# Patient Record
Sex: Male | Born: 1965 | Race: White | Hispanic: No | Marital: Married | Smoking: Current every day smoker
Health system: Southern US, Community
[De-identification: ages and names within clinical notes are randomized; demographics above are authoritative.]

## PROBLEM LIST (undated history)

## (undated) ENCOUNTER — Emergency Department (HOSPITAL_COMMUNITY): Discharge status: Against Medical Advice | Payer: Medicaid Other

## (undated) ENCOUNTER — Emergency Department (HOSPITAL_COMMUNITY): Payer: Medicaid Other | Source: Home / Self Care

## (undated) DIAGNOSIS — Z9359 Other cystostomy status: Secondary | ICD-10-CM

## (undated) DIAGNOSIS — F32A Depression, unspecified: Secondary | ICD-10-CM

## (undated) DIAGNOSIS — G8220 Paraplegia, unspecified: Secondary | ICD-10-CM

## (undated) DIAGNOSIS — F419 Anxiety disorder, unspecified: Secondary | ICD-10-CM

## (undated) DIAGNOSIS — Z960 Presence of urogenital implants: Secondary | ICD-10-CM

## (undated) DIAGNOSIS — G8929 Other chronic pain: Secondary | ICD-10-CM

## (undated) DIAGNOSIS — Z978 Presence of other specified devices: Secondary | ICD-10-CM

## (undated) DIAGNOSIS — G709 Myoneural disorder, unspecified: Secondary | ICD-10-CM

## (undated) DIAGNOSIS — K219 Gastro-esophageal reflux disease without esophagitis: Secondary | ICD-10-CM

## (undated) DIAGNOSIS — N200 Calculus of kidney: Secondary | ICD-10-CM

## (undated) DIAGNOSIS — M545 Low back pain: Secondary | ICD-10-CM

## (undated) DIAGNOSIS — S3992XA Unspecified injury of lower back, initial encounter: Secondary | ICD-10-CM

## (undated) DIAGNOSIS — Z87442 Personal history of urinary calculi: Secondary | ICD-10-CM

## (undated) DIAGNOSIS — Z9289 Personal history of other medical treatment: Secondary | ICD-10-CM

## (undated) DIAGNOSIS — F329 Major depressive disorder, single episode, unspecified: Secondary | ICD-10-CM

## (undated) HISTORY — PX: CYSTOSCOPY W/ STONE MANIPULATION: SHX1427

## (undated) HISTORY — PX: LITHOTRIPSY: SUR834

## (undated) HISTORY — PX: BACK SURGERY: SHX140

## (undated) HISTORY — DX: Calculus of kidney: N20.0

---

## 2002-09-28 DIAGNOSIS — S3992XA Unspecified injury of lower back, initial encounter: Secondary | ICD-10-CM

## 2002-09-28 HISTORY — DX: Unspecified injury of lower back, initial encounter: S39.92XA

## 2003-11-02 ENCOUNTER — Emergency Department (HOSPITAL_COMMUNITY): Admission: EM | Admit: 2003-11-02 | Discharge: 2003-11-03 | Payer: Self-pay | Admitting: Internal Medicine

## 2003-11-02 ENCOUNTER — Emergency Department (HOSPITAL_COMMUNITY): Admission: EM | Admit: 2003-11-02 | Discharge: 2003-11-02 | Payer: Self-pay | Admitting: Emergency Medicine

## 2003-11-06 ENCOUNTER — Ambulatory Visit (HOSPITAL_COMMUNITY): Admission: RE | Admit: 2003-11-06 | Discharge: 2003-11-06 | Payer: Self-pay | Admitting: Internal Medicine

## 2003-11-22 ENCOUNTER — Ambulatory Visit (HOSPITAL_COMMUNITY): Admission: RE | Admit: 2003-11-22 | Discharge: 2003-11-22 | Payer: Self-pay | Admitting: Internal Medicine

## 2003-11-26 ENCOUNTER — Ambulatory Visit (HOSPITAL_COMMUNITY): Admission: RE | Admit: 2003-11-26 | Discharge: 2003-11-26 | Payer: Self-pay | Admitting: Internal Medicine

## 2004-01-21 ENCOUNTER — Ambulatory Visit (HOSPITAL_COMMUNITY): Admission: RE | Admit: 2004-01-21 | Discharge: 2004-01-21 | Payer: Self-pay | Admitting: Neurosurgery

## 2004-05-26 ENCOUNTER — Ambulatory Visit (HOSPITAL_COMMUNITY): Admission: RE | Admit: 2004-05-26 | Discharge: 2004-05-26 | Payer: Self-pay | Admitting: Internal Medicine

## 2004-08-12 ENCOUNTER — Ambulatory Visit (HOSPITAL_COMMUNITY): Admission: RE | Admit: 2004-08-12 | Discharge: 2004-08-12 | Payer: Self-pay | Admitting: Neurosurgery

## 2009-02-22 ENCOUNTER — Encounter: Admission: RE | Admit: 2009-02-22 | Discharge: 2009-02-22 | Payer: Self-pay | Admitting: Neurosurgery

## 2009-08-19 ENCOUNTER — Encounter: Admission: RE | Admit: 2009-08-19 | Discharge: 2009-08-19 | Payer: Self-pay | Admitting: Neurosurgery

## 2010-01-26 HISTORY — PX: LAMINECTOMY AND MICRODISCECTOMY THORACIC SPINE: SHX1915

## 2010-02-07 ENCOUNTER — Inpatient Hospital Stay (HOSPITAL_COMMUNITY): Admission: RE | Admit: 2010-02-07 | Discharge: 2010-02-10 | Payer: Self-pay | Admitting: Neurosurgery

## 2010-02-07 ENCOUNTER — Encounter (INDEPENDENT_AMBULATORY_CARE_PROVIDER_SITE_OTHER): Payer: Self-pay | Admitting: Neurosurgery

## 2010-06-09 ENCOUNTER — Ambulatory Visit (HOSPITAL_COMMUNITY): Admission: RE | Admit: 2010-06-09 | Discharge: 2010-06-09 | Payer: Self-pay | Admitting: Urology

## 2010-09-11 ENCOUNTER — Encounter
Admission: RE | Admit: 2010-09-11 | Discharge: 2010-09-16 | Payer: Self-pay | Source: Home / Self Care | Attending: Physical Medicine & Rehabilitation | Admitting: Physical Medicine & Rehabilitation

## 2010-09-16 ENCOUNTER — Ambulatory Visit: Payer: Self-pay | Admitting: Physical Medicine & Rehabilitation

## 2010-10-02 ENCOUNTER — Encounter (HOSPITAL_COMMUNITY)
Admission: RE | Admit: 2010-10-02 | Discharge: 2010-10-28 | Payer: Self-pay | Source: Home / Self Care | Attending: Physical Medicine & Rehabilitation | Admitting: Physical Medicine & Rehabilitation

## 2010-10-10 ENCOUNTER — Encounter
Admission: RE | Admit: 2010-10-10 | Discharge: 2010-10-28 | Payer: Self-pay | Source: Home / Self Care | Attending: Physical Medicine & Rehabilitation | Admitting: Physical Medicine & Rehabilitation

## 2010-10-14 ENCOUNTER — Ambulatory Visit
Admission: RE | Admit: 2010-10-14 | Discharge: 2010-10-14 | Payer: Self-pay | Source: Home / Self Care | Attending: Physical Medicine & Rehabilitation | Admitting: Physical Medicine & Rehabilitation

## 2010-10-18 ENCOUNTER — Encounter: Payer: Self-pay | Admitting: Internal Medicine

## 2010-10-18 ENCOUNTER — Encounter: Payer: Self-pay | Admitting: Neurology

## 2010-10-19 ENCOUNTER — Encounter: Payer: Self-pay | Admitting: Neurosurgery

## 2010-10-20 ENCOUNTER — Encounter: Payer: Self-pay | Admitting: Neurosurgery

## 2010-11-11 ENCOUNTER — Ambulatory Visit (HOSPITAL_BASED_OUTPATIENT_CLINIC_OR_DEPARTMENT_OTHER): Payer: Medicaid Other | Admitting: Physical Medicine & Rehabilitation

## 2010-11-11 ENCOUNTER — Encounter: Payer: Medicaid Other | Attending: Physical Medicine & Rehabilitation

## 2010-11-11 DIAGNOSIS — M4714 Other spondylosis with myelopathy, thoracic region: Secondary | ICD-10-CM

## 2010-11-11 DIAGNOSIS — G950 Syringomyelia and syringobulbia: Secondary | ICD-10-CM | POA: Insufficient documentation

## 2010-11-11 DIAGNOSIS — G8220 Paraplegia, unspecified: Secondary | ICD-10-CM

## 2010-11-11 DIAGNOSIS — IMO0002 Reserved for concepts with insufficient information to code with codable children: Secondary | ICD-10-CM | POA: Insufficient documentation

## 2010-11-11 DIAGNOSIS — N200 Calculus of kidney: Secondary | ICD-10-CM | POA: Insufficient documentation

## 2010-11-11 DIAGNOSIS — Z79899 Other long term (current) drug therapy: Secondary | ICD-10-CM | POA: Insufficient documentation

## 2010-12-10 ENCOUNTER — Ambulatory Visit (HOSPITAL_BASED_OUTPATIENT_CLINIC_OR_DEPARTMENT_OTHER): Payer: Medicaid Other

## 2010-12-10 ENCOUNTER — Encounter: Payer: Medicaid Other | Attending: Physical Medicine & Rehabilitation

## 2010-12-10 DIAGNOSIS — G839 Paralytic syndrome, unspecified: Secondary | ICD-10-CM | POA: Insufficient documentation

## 2010-12-10 DIAGNOSIS — G950 Syringomyelia and syringobulbia: Secondary | ICD-10-CM | POA: Insufficient documentation

## 2010-12-10 DIAGNOSIS — M79609 Pain in unspecified limb: Secondary | ICD-10-CM | POA: Insufficient documentation

## 2010-12-10 DIAGNOSIS — M4714 Other spondylosis with myelopathy, thoracic region: Secondary | ICD-10-CM

## 2010-12-10 DIAGNOSIS — M549 Dorsalgia, unspecified: Secondary | ICD-10-CM | POA: Insufficient documentation

## 2010-12-11 ENCOUNTER — Other Ambulatory Visit (HOSPITAL_COMMUNITY): Payer: Self-pay | Admitting: Urology

## 2010-12-11 DIAGNOSIS — N200 Calculus of kidney: Secondary | ICD-10-CM

## 2010-12-16 LAB — CBC
HCT: 46.7 % (ref 39.0–52.0)
Hemoglobin: 16.5 g/dL (ref 13.0–17.0)
MCHC: 35.3 g/dL (ref 30.0–36.0)
MCV: 94.6 fL (ref 78.0–100.0)
Platelets: 142 10*3/uL — ABNORMAL LOW (ref 150–400)
RBC: 4.94 MIL/uL (ref 4.22–5.81)
RDW: 13 % (ref 11.5–15.5)
WBC: 8.4 10*3/uL (ref 4.0–10.5)

## 2010-12-16 LAB — SURGICAL PCR SCREEN
MRSA, PCR: NEGATIVE
Staphylococcus aureus: NEGATIVE

## 2010-12-17 ENCOUNTER — Ambulatory Visit (HOSPITAL_COMMUNITY): Payer: Medicaid Other
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
## 2011-01-16 ENCOUNTER — Encounter: Payer: Medicaid Other | Attending: Physical Medicine & Rehabilitation

## 2011-01-16 ENCOUNTER — Ambulatory Visit (HOSPITAL_BASED_OUTPATIENT_CLINIC_OR_DEPARTMENT_OTHER): Payer: Medicaid Other | Admitting: Physical Medicine & Rehabilitation

## 2011-01-16 DIAGNOSIS — G950 Syringomyelia and syringobulbia: Secondary | ICD-10-CM | POA: Insufficient documentation

## 2011-01-16 DIAGNOSIS — M4714 Other spondylosis with myelopathy, thoracic region: Secondary | ICD-10-CM

## 2011-01-16 DIAGNOSIS — G8220 Paraplegia, unspecified: Secondary | ICD-10-CM | POA: Insufficient documentation

## 2011-01-16 DIAGNOSIS — Z79899 Other long term (current) drug therapy: Secondary | ICD-10-CM | POA: Insufficient documentation

## 2011-01-16 DIAGNOSIS — IMO0002 Reserved for concepts with insufficient information to code with codable children: Secondary | ICD-10-CM | POA: Insufficient documentation

## 2011-01-16 DIAGNOSIS — N200 Calculus of kidney: Secondary | ICD-10-CM | POA: Insufficient documentation
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
## 2011-02-11 ENCOUNTER — Encounter: Payer: Medicaid Other | Attending: Physical Medicine & Rehabilitation | Admitting: Neurosurgery

## 2011-02-11 DIAGNOSIS — N200 Calculus of kidney: Secondary | ICD-10-CM | POA: Insufficient documentation

## 2011-02-11 DIAGNOSIS — Z79899 Other long term (current) drug therapy: Secondary | ICD-10-CM | POA: Insufficient documentation

## 2011-02-11 DIAGNOSIS — G894 Chronic pain syndrome: Secondary | ICD-10-CM

## 2011-02-11 DIAGNOSIS — G8220 Paraplegia, unspecified: Secondary | ICD-10-CM | POA: Insufficient documentation

## 2011-02-11 DIAGNOSIS — IMO0002 Reserved for concepts with insufficient information to code with codable children: Secondary | ICD-10-CM | POA: Insufficient documentation

## 2011-02-11 DIAGNOSIS — G950 Syringomyelia and syringobulbia: Secondary | ICD-10-CM | POA: Insufficient documentation
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
## 2011-03-13 ENCOUNTER — Encounter: Payer: Medicaid Other | Attending: Physical Medicine & Rehabilitation

## 2011-03-13 ENCOUNTER — Ambulatory Visit (HOSPITAL_BASED_OUTPATIENT_CLINIC_OR_DEPARTMENT_OTHER): Payer: Medicaid Other | Admitting: Physical Medicine & Rehabilitation

## 2011-03-13 DIAGNOSIS — Z79899 Other long term (current) drug therapy: Secondary | ICD-10-CM | POA: Insufficient documentation

## 2011-03-13 DIAGNOSIS — G8220 Paraplegia, unspecified: Secondary | ICD-10-CM | POA: Insufficient documentation

## 2011-03-13 DIAGNOSIS — IMO0002 Reserved for concepts with insufficient information to code with codable children: Secondary | ICD-10-CM | POA: Insufficient documentation

## 2011-03-13 DIAGNOSIS — N200 Calculus of kidney: Secondary | ICD-10-CM | POA: Insufficient documentation

## 2011-03-13 DIAGNOSIS — G950 Syringomyelia and syringobulbia: Secondary | ICD-10-CM | POA: Insufficient documentation

## 2011-04-02 NOTE — Assessment & Plan Note (Addendum)
REASON FOR VISIT:  Spasms in both calves.  A 45 year old male with chronic low back pain as well as spastic paraplegia due to posttraumatic thoracic syringomyelia.  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  From a pain management standpoint, he has done relatively well on a fairly complex combination of medications.  We are prescribing oxycodone 10 mg q.i.d. with Opana 40 mg b.i.d.  His primary care physician is prescribing Neurontin 800 mg t.i.d., naproxen 500 b.i.d. as well as Cymbalta 60 mg a day.  In addition, he is on Valium 10 mg t.i.d. and 20 at bedtime.  From a spasticity standpoint, we have him on baclofen 20 q.i.d. up from 10 q.i.d..  INTERVAL HISTORY:  No falls.  No new medical issues.  Pain level is 8-9 if he does not take his medicine, but with medicine he has a 50% relief or has a fair relief.  He walks 15 minutes at a time with a walker.  He drives short distances.  He needs help with certain dressing and bathing activities but otherwise is independent.  He needs help with meal prep, household duties, and shopping.  REVIEW OF SYSTEMS:  Positive for bladder control problems, weakness, numbness, trouble walking, spasms, and anxiety.  SOCIAL HISTORY:  Married, lives with his wife.  FAMILY HISTORY:  Diabetes.  PHYSICAL EXAMINATION:  VITAL SIGNS:  Blood pressure 120/69, pulse 88, respirations 18, and O2 saturation 97% on room air. GENERAL:  This is an overweight male in no acute distress.  His mood and affect are appropriate. NEUROLOGIC:  His motor strength is 5/5 in the upper extremities.  In the lower extremity, 3- in the ankle dorsiflexors, 4- in the extensors, 3 in the hip flexors.  He has clonus at bilateral ankles.  He has also 2 beats of clonus at the knee extensors.  He has Ashworth grade 3 spasticity of the knee extensors and  ankle plantar flexors.  IMPRESSION: 1. 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 2. Chronic pain associated with myelopathy.  PLAN:  For his spasticity, he will continue his baclofen 20 q.i.d. as well as the Valium 10 mg t.i.d. and 20 at bedtime, but in addition, he is having some painful spasms in his calves.  For this reason, we will inject Botox 100 units per gastrocnemius, i.e., 50 into the lateral and medial heads bilaterally.  We may need to increase that dose if he has insufficient result.  In addition, we will continue the current medications.  In terms of his pain medications, oxycodone 10 mg q.i.d. in combination with Opana 40 mg b.i.d.  He is already taking Cymbalta 60 mg a day plus gabapentin 800 t.i.d. Discussed with the patient, agrees with plan.     Erick Colace, M.D. Electronically Signed    AEK/MedQ D:  03/13/2011 14:18:47  T:  03/14/2011 01:24:45  Job #:  [id number]  cc:   Carroll Sage, MD

## 2011-04-10 ENCOUNTER — Ambulatory Visit: Payer: Medicaid Other | Admitting: Neurosurgery

## 2011-05-19 ENCOUNTER — Ambulatory Visit (HOSPITAL_COMMUNITY): Payer: Medicaid Other | Admitting: Physical Therapy

## 2011-06-08 ENCOUNTER — Ambulatory Visit (HOSPITAL_COMMUNITY)
Admission: RE | Admit: 2011-06-08 | Discharge: 2011-06-08 | Disposition: A | Discharge status: Home / Self Care | Payer: Medicaid Other | Source: Ambulatory Visit | Attending: Family Medicine | Admitting: Family Medicine

## 2011-06-08 DIAGNOSIS — M25676 Stiffness of unspecified foot, not elsewhere classified: Secondary | ICD-10-CM | POA: Insufficient documentation

## 2011-06-08 DIAGNOSIS — M25669 Stiffness of unspecified knee, not elsewhere classified: Secondary | ICD-10-CM | POA: Insufficient documentation

## 2011-06-08 DIAGNOSIS — M25673 Stiffness of unspecified ankle, not elsewhere classified: Secondary | ICD-10-CM | POA: Insufficient documentation

## 2011-06-08 DIAGNOSIS — M25579 Pain in unspecified ankle and joints of unspecified foot: Secondary | ICD-10-CM | POA: Insufficient documentation

## 2011-06-08 DIAGNOSIS — IMO0001 Reserved for inherently not codable concepts without codable children: Secondary | ICD-10-CM | POA: Insufficient documentation

## 2011-06-08 DIAGNOSIS — M6281 Muscle weakness (generalized): Secondary | ICD-10-CM | POA: Insufficient documentation

## 2011-06-08 DIAGNOSIS — M25569 Pain in unspecified knee: Secondary | ICD-10-CM | POA: Insufficient documentation

## 2011-06-08 NOTE — Progress Notes (Signed)
Pt is a medicaid pt.  Note will be scanned into system.   Pt is to work on prone exercises next visit.   Pt seen for eval and theraband exercises for improved posture.

## 2011-06-08 NOTE — Patient Instructions (Addendum)
HEP for T- band postural mm.

## 2011-06-15 ENCOUNTER — Telehealth (HOSPITAL_COMMUNITY): Payer: Self-pay | Admitting: *Deleted

## 2011-06-15 ENCOUNTER — Inpatient Hospital Stay (HOSPITAL_COMMUNITY): Admission: RE | Admit: 2011-06-15 | Payer: Medicaid Other | Source: Ambulatory Visit | Admitting: *Deleted

## 2011-10-15 ENCOUNTER — Ambulatory Visit (HOSPITAL_COMMUNITY)
Admission: RE | Admit: 2011-10-15 | Discharge: 2011-10-15 | Disposition: A | Discharge status: Home / Self Care | Payer: Medicaid Other | Source: Ambulatory Visit | Attending: Urology | Admitting: Urology

## 2011-10-15 ENCOUNTER — Other Ambulatory Visit (HOSPITAL_COMMUNITY): Payer: Self-pay | Admitting: Urology

## 2011-10-15 DIAGNOSIS — R109 Unspecified abdominal pain: Secondary | ICD-10-CM | POA: Insufficient documentation

## 2011-10-15 DIAGNOSIS — N200 Calculus of kidney: Secondary | ICD-10-CM

## 2011-10-15 DIAGNOSIS — Z09 Encounter for follow-up examination after completed treatment for conditions other than malignant neoplasm: Secondary | ICD-10-CM | POA: Insufficient documentation

## 2011-10-26 ENCOUNTER — Other Ambulatory Visit (HOSPITAL_COMMUNITY): Payer: Self-pay | Admitting: Urology

## 2011-10-26 DIAGNOSIS — N200 Calculus of kidney: Secondary | ICD-10-CM

## 2011-10-28 ENCOUNTER — Ambulatory Visit (HOSPITAL_COMMUNITY)
Admission: RE | Admit: 2011-10-28 | Discharge: 2011-10-28 | Disposition: A | Discharge status: Home / Self Care | Payer: Medicaid Other | Source: Ambulatory Visit | Attending: Urology | Admitting: Urology

## 2011-10-28 DIAGNOSIS — N200 Calculus of kidney: Secondary | ICD-10-CM | POA: Insufficient documentation

## 2011-10-28 DIAGNOSIS — R1032 Left lower quadrant pain: Secondary | ICD-10-CM | POA: Insufficient documentation

## 2011-10-28 DIAGNOSIS — R1031 Right lower quadrant pain: Secondary | ICD-10-CM | POA: Insufficient documentation

## 2011-10-30 ENCOUNTER — Encounter (HOSPITAL_COMMUNITY): Payer: Self-pay | Admitting: Pharmacy Technician

## 2011-11-02 ENCOUNTER — Encounter (HOSPITAL_COMMUNITY)
Admission: RE | Admit: 2011-11-02 | Discharge: 2011-11-02 | Payer: Medicaid Other | Source: Ambulatory Visit | Attending: Urology | Admitting: Urology

## 2011-11-02 NOTE — Patient Instructions (Addendum)
[address]  11/02/2011   Your procedure is scheduled on:  11/04/11  Report to [facility] at 11:30 AM.  Call this number if you have problems the morning of surgery: [phone]   Remember:   Do not eat food:After Midnight.  May have clear liquids:until Midnight .  Clear liquids include soda, tea, black coffee, apple or grape juice, broth.  Take these medicines the morning of surgery with A SIP OF WATER: Baclofen, Flexeril, and Neurotin. Take your Valium and Oxycodone only if needed.   Do not wear jewelry, make-up or nail polish.  Do not wear lotions, powders, or perfumes. You may wear deodorant.  Do not shave 48 hours prior to surgery.  Do not bring valuables to the hospital.  Contacts, dentures or bridgework may not be worn into surgery.  Leave suitcase in the car. After surgery it may be brought to your room.  For patients admitted to the hospital, checkout time is 11:00 AM the day of discharge.   Patients discharged the day of surgery will not be allowed to drive home.  Name and phone number of your driver:   Special Instructions: N/A   Please read over the following fact sheets that you were given: Pain Booklet, Anesthesia Post-op Instructions and Care and Recovery After Surgery    Lithotripsy for Kidney Stones WHAT ARE KIDNEY STONES? The kidneys filter blood for chemicals the body cannot use. These waste chemicals are eliminated in the urine. They are removed from the body. Under some conditions, these chemicals may become concentrated. When this happens, they form crystals in the urine. When these crystals build up and stick together, stones may form. When these stones block the flow of urine through the urinary tract, they may cause severe pain. The urinary tract is very sensitive to blockage and stretching by the stone. WHAT IS LITHOTRIPSY? Lithotripsy is a treatment that can sometimes help eliminate kidney stones and pain faster. A form of lithotripsy, also known as ESWL  (extracorporeal shock wave lithotripsy), is a nonsurgical procedure that helps your body rid itself of the kidney stone with a minimum amount of pain. EWSL is a method of crushing a kidney stone with shock waves. These shock waves pass through your body. They cause the kidney stones to crumble while still in the urinary tract. It is then easier for the smaller pieces of stone to pass in the urine. Lithotripsy usually takes about an hour. It is done in a hospital, a lithotripsy center, or a mobile unit. It usually does not require an overnight stay. Your caregiver will instruct you on preparation for the procedure. Your caregiver will tell you what to expect afterward. LET YOUR CAREGIVER KNOW ABOUT:  Allergies.   Medicines taken including herbs, eye drops, over the counter medicines (including aspirin, aleve, or motrin for treatment of inflammatory conditions) and creams.   Use of steroids (by mouth or creams).   Previous problems with anesthetics or novocaine.   Possibility of pregnancy, if this applies.   History of blood clots (thrombophlebitis).   History of bleeding or blood problems.   Previous surgery.   Other health problems.  RISKS AND COMPLICATIONS Complications of lithotripsy are uncommon, but include the following:  Infection.   Bleeding of the kidney.   Bruising of the kidney or skin.   Obstruction of the ureter (the passageway from the kidney to the bladder).   Failure of the stone to fragment (break apart).  PROCEDURE A stent (flexible tube with  holes) may be placed in your ureter. The ureter is the tube that transports the urine from the kidneys to the bladder. Your caregiver may place a stent before the procedure. This will help keep urine flowing from the kidney if the fragments of the stone block the ureter. You may receive an intravenous (IV) line to give you fluids and medicines. These medicines may help you relax or make you sleep. During the procedure, you will  lie comfortably on a fluid-filled cushion or in a warm-water bath. After an x-ray or ultrasound locates your stone, shock waves are aimed at the stone. If you are awake, you may feel a tapping sensation (feeling) as the shock waves pass through your body. If large stone particles remain after treatment, a second procedure may be necessary at a later date. For comfort during the test:  Relax as much as possible.   Try to remain still as much as possible.   Try to follow instructions to speed up the test.   Let your caregiver know if you are uncomfortable, anxious, or in pain.  AFTER THE PROCEDURE  After surgery, you will be taken to the recovery area. A nurse will watch and check your progress. Once you're awake, stable, and taking fluids well, you will be allowed to go home as long as there are no problems. You may be prescribed antibiotics (medicines that kill germs) to help prevent infection. You may also be prescribed pain medicine if needed. In a week or two, your doctor may remove your stent, if you have one. Your caregiver will check to see whether or not stone particles remain. PASSING THE STONE It may take anywhere from a day to several weeks for the stone particles to leave your body. During this time, drink at least 8 to 12 eight ounce glasses of water every day. It is normal for your urine to be cloudy or slightly bloody for a few weeks following this procedure. You may even see small pieces of stone in your urine. A slight fever and some pain are also normal. Your caregiver may ask you to strain your urine to collect some stone particles for chemical analysis. If you find particles while straining the urine, save them. Analysis tells you and the caregiver what the stone is made of. Knowing this may help prevent future stones. PREVENTING FUTURE STONES  Drink about 8 to 12, eight-ounce glasses of water every day.   Follow the diet your caregiver recommends.   Take your prescribed  medicine.   See your caregiver regularly for checkups.  SEEK IMMEDIATE MEDICAL CARE IF:  You develop an oral temperature above 102 F (38.9 C), or as your caregiver suggests.   Your pain is not relieved by medicine.   You develop nausea (feeling sick to your stomach) and vomiting.   You develop heavy bleeding.   You have difficulty urinating.  Document Released: 09/11/2000 Document Revised: 05/27/2011 Document Reviewed: 07/06/2008 [facility] Patient Information 2012 [place], [place].   PATIENT INSTRUCTIONS POST-ANESTHESIA  IMMEDIATELY FOLLOWING SURGERY:  Do not drive or operate machinery for the first twenty four hours after surgery.  Do not make any important decisions for twenty four hours after surgery or while taking narcotic pain medications or sedatives.  If you develop intractable nausea and vomiting or a severe headache please notify your doctor immediately.  FOLLOW-UP:  Please make an appointment with your surgeon as instructed. You do not need to follow up with anesthesia unless specifically instructed to do so.  WOUND CARE INSTRUCTIONS (if applicable):  Keep a dry clean dressing on the anesthesia/puncture wound site if there is drainage.  Once the wound has quit draining you may leave it open to air.  Generally you should leave the bandage intact for twenty four hours unless there is drainage.  If the epidural site drains for more than 36-48 hours please call the anesthesia department.  QUESTIONS?:  Please feel free to call your physician or the hospital operator if you have any questions, and they will be happy to assist you.     [facility] Anesthesia Department [address] [place] [phone]

## 2011-11-03 ENCOUNTER — Encounter (HOSPITAL_COMMUNITY)
Admission: RE | Admit: 2011-11-03 | Discharge: 2011-11-03 | Disposition: A | Discharge status: Home / Self Care | Payer: Medicaid Other | Source: Ambulatory Visit | Attending: Urology | Admitting: Urology

## 2011-11-03 ENCOUNTER — Encounter (HOSPITAL_COMMUNITY): Payer: Self-pay

## 2011-11-03 HISTORY — DX: Unspecified injury of lower back, initial encounter: S39.92XA

## 2011-11-03 HISTORY — DX: Myoneural disorder, unspecified: G70.9

## 2011-11-03 HISTORY — DX: Depression, unspecified: F32.A

## 2011-11-03 HISTORY — DX: Major depressive disorder, single episode, unspecified: F32.9

## 2011-11-04 ENCOUNTER — Encounter (HOSPITAL_COMMUNITY): Admission: RE | Payer: Self-pay | Source: Ambulatory Visit

## 2011-11-04 ENCOUNTER — Ambulatory Visit (HOSPITAL_COMMUNITY): Admission: RE | Admit: 2011-11-04 | Payer: Medicaid Other | Source: Ambulatory Visit | Admitting: Urology

## 2011-11-04 SURGERY — LITHOTRIPSY, ESWL
Anesthesia: Moderate Sedation | Laterality: Left

## 2012-06-02 ENCOUNTER — Other Ambulatory Visit (HOSPITAL_COMMUNITY): Payer: Self-pay | Admitting: Urology

## 2012-06-02 DIAGNOSIS — R109 Unspecified abdominal pain: Secondary | ICD-10-CM

## 2012-06-06 ENCOUNTER — Ambulatory Visit (HOSPITAL_COMMUNITY)
Admission: RE | Admit: 2012-06-06 | Discharge: 2012-06-06 | Disposition: A | Discharge status: Home / Self Care | Payer: Medicaid Other | Source: Ambulatory Visit | Attending: Urology | Admitting: Urology

## 2012-06-06 ENCOUNTER — Encounter (HOSPITAL_COMMUNITY): Payer: Self-pay

## 2012-06-06 DIAGNOSIS — R9389 Abnormal findings on diagnostic imaging of other specified body structures: Secondary | ICD-10-CM | POA: Insufficient documentation

## 2012-06-06 DIAGNOSIS — N210 Calculus in bladder: Secondary | ICD-10-CM | POA: Insufficient documentation

## 2012-06-06 DIAGNOSIS — R109 Unspecified abdominal pain: Secondary | ICD-10-CM

## 2012-06-06 DIAGNOSIS — R1032 Left lower quadrant pain: Secondary | ICD-10-CM | POA: Insufficient documentation

## 2014-06-16 ENCOUNTER — Encounter (HOSPITAL_COMMUNITY): Payer: Self-pay | Admitting: Emergency Medicine

## 2014-06-16 ENCOUNTER — Emergency Department (HOSPITAL_COMMUNITY)
Admission: EM | Admit: 2014-06-16 | Discharge: 2014-06-16 | Disposition: A | Discharge status: Home / Self Care | Payer: Medicaid Other | Attending: Emergency Medicine | Admitting: Emergency Medicine

## 2014-06-16 DIAGNOSIS — Z87891 Personal history of nicotine dependence: Secondary | ICD-10-CM | POA: Diagnosis not present

## 2014-06-16 DIAGNOSIS — M545 Low back pain, unspecified: Secondary | ICD-10-CM | POA: Diagnosis present

## 2014-06-16 DIAGNOSIS — F329 Major depressive disorder, single episode, unspecified: Secondary | ICD-10-CM | POA: Diagnosis not present

## 2014-06-16 DIAGNOSIS — Z87442 Personal history of urinary calculi: Secondary | ICD-10-CM | POA: Diagnosis not present

## 2014-06-16 DIAGNOSIS — F3289 Other specified depressive episodes: Secondary | ICD-10-CM | POA: Diagnosis not present

## 2014-06-16 DIAGNOSIS — Z87828 Personal history of other (healed) physical injury and trauma: Secondary | ICD-10-CM | POA: Insufficient documentation

## 2014-06-16 DIAGNOSIS — Z791 Long term (current) use of non-steroidal anti-inflammatories (NSAID): Secondary | ICD-10-CM | POA: Diagnosis not present

## 2014-06-16 DIAGNOSIS — Z8669 Personal history of other diseases of the nervous system and sense organs: Secondary | ICD-10-CM | POA: Insufficient documentation

## 2014-06-16 DIAGNOSIS — G8929 Other chronic pain: Secondary | ICD-10-CM | POA: Insufficient documentation

## 2014-06-16 MED ORDER — DIAZEPAM 10 MG PO TABS: 10.0000 mg | ORAL_TABLET | Freq: Four times a day (QID) | ORAL | Status: DC | PRN

## 2014-06-16 MED ORDER — HYDROMORPHONE HCL 2 MG/ML IJ SOLN
2.0000 mg | Freq: Once | INTRAMUSCULAR | Status: AC
  Administered 2014-06-16: 2 mg via INTRAMUSCULAR
  Filled 2014-06-16: qty 1

## 2014-06-16 MED ORDER — DIAZEPAM 5 MG PO TABS
10.0000 mg | ORAL_TABLET | Freq: Once | ORAL | Status: AC
  Administered 2014-06-16: 10 mg via ORAL
  Filled 2014-06-16: qty 2

## 2014-06-16 MED ORDER — OXYCODONE-ACETAMINOPHEN 5-325 MG PO TABS: 2.0000 | ORAL_TABLET | ORAL | Status: DC | PRN
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
## 2014-06-16 NOTE — Discharge Instructions (Signed)
You have been given prescriptions for Percocet and Valium as a one-time courtesy.  Please followup with your primary care physician on Monday to discuss the situation and for further refills of your medications.   Chronic Back Pain  When back pain lasts longer than 3 months, it is called chronic back pain.People with chronic back pain often go through certain periods that are more intense (flare-ups).  CAUSES Chronic back pain can be caused by wear and tear (degeneration) on different structures in your back. These structures include:  The bones of your spine (vertebrae) and the joints surrounding your spinal cord and nerve roots (facets).  The strong, fibrous tissues that connect your vertebrae (ligaments). Degeneration of these structures may result in pressure on your nerves. This can lead to constant pain. HOME CARE INSTRUCTIONS  Avoid bending, heavy lifting, prolonged sitting, and activities which make the problem worse.  Take brief periods of rest throughout the day to reduce your pain. Lying down or standing usually is better than sitting while you are resting.  Take over-the-counter or prescription medicines only as directed by your caregiver. SEEK IMMEDIATE MEDICAL CARE IF:   You have weakness or numbness in one of your legs or feet.  You have trouble controlling your bladder or bowels.  You have nausea, vomiting, abdominal pain, shortness of breath, or fainting. Document Released: 10/22/2004 Document Revised: 12/07/2011 Document Reviewed: 08/29/2011 [facility] Patient Information 2015 [place], [place]. This information is not intended to replace advice given to you by your health care provider. Make sure you discuss any questions you have with your health care provider.

## 2014-06-16 NOTE — ED Notes (Signed)
Pt verbalized understanding of discharge instructions and need for follow-up care regarding long-term/chronic pain. Pt's concerns addressed and questions answered. Pt escorted of unit in wheelchair accompanied with his wife.
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
## 2014-11-15 DIAGNOSIS — Z791 Long term (current) use of non-steroidal anti-inflammatories (NSAID): Secondary | ICD-10-CM | POA: Diagnosis not present

## 2014-11-15 DIAGNOSIS — R600 Localized edema: Secondary | ICD-10-CM | POA: Diagnosis not present

## 2014-11-15 DIAGNOSIS — L89622 Pressure ulcer of left heel, stage 2: Secondary | ICD-10-CM | POA: Diagnosis not present

## 2014-11-15 DIAGNOSIS — Z79899 Other long term (current) drug therapy: Secondary | ICD-10-CM | POA: Diagnosis not present

## 2014-11-15 DIAGNOSIS — G8220 Paraplegia, unspecified: Secondary | ICD-10-CM | POA: Diagnosis not present

## 2014-11-15 DIAGNOSIS — L89612 Pressure ulcer of right heel, stage 2: Secondary | ICD-10-CM | POA: Diagnosis not present

## 2014-11-15 DIAGNOSIS — Z79891 Long term (current) use of opiate analgesic: Secondary | ICD-10-CM | POA: Diagnosis not present

## 2014-11-27 DIAGNOSIS — G8220 Paraplegia, unspecified: Secondary | ICD-10-CM | POA: Diagnosis not present

## 2014-11-27 DIAGNOSIS — L89613 Pressure ulcer of right heel, stage 3: Secondary | ICD-10-CM | POA: Diagnosis not present

## 2014-11-27 DIAGNOSIS — L89623 Pressure ulcer of left heel, stage 3: Secondary | ICD-10-CM | POA: Diagnosis not present

## 2014-12-03 DIAGNOSIS — L89623 Pressure ulcer of left heel, stage 3: Secondary | ICD-10-CM | POA: Diagnosis not present

## 2014-12-03 DIAGNOSIS — G8222 Paraplegia, incomplete: Secondary | ICD-10-CM | POA: Diagnosis not present

## 2014-12-03 DIAGNOSIS — Z4800 Encounter for change or removal of nonsurgical wound dressing: Secondary | ICD-10-CM | POA: Diagnosis not present

## 2014-12-03 DIAGNOSIS — R609 Edema, unspecified: Secondary | ICD-10-CM | POA: Diagnosis not present

## 2014-12-03 DIAGNOSIS — S24102S Unspecified injury at T2-T6 level of thoracic spinal cord, sequela: Secondary | ICD-10-CM | POA: Diagnosis not present

## 2014-12-03 DIAGNOSIS — L89613 Pressure ulcer of right heel, stage 3: Secondary | ICD-10-CM | POA: Diagnosis not present

## 2014-12-04 DIAGNOSIS — G8222 Paraplegia, incomplete: Secondary | ICD-10-CM | POA: Diagnosis not present

## 2014-12-04 DIAGNOSIS — L89623 Pressure ulcer of left heel, stage 3: Secondary | ICD-10-CM | POA: Diagnosis not present

## 2014-12-04 DIAGNOSIS — L89613 Pressure ulcer of right heel, stage 3: Secondary | ICD-10-CM | POA: Diagnosis not present

## 2014-12-04 DIAGNOSIS — S24102S Unspecified injury at T2-T6 level of thoracic spinal cord, sequela: Secondary | ICD-10-CM | POA: Diagnosis not present

## 2014-12-04 DIAGNOSIS — Z4800 Encounter for change or removal of nonsurgical wound dressing: Secondary | ICD-10-CM | POA: Diagnosis not present

## 2014-12-04 DIAGNOSIS — R609 Edema, unspecified: Secondary | ICD-10-CM | POA: Diagnosis not present

## 2014-12-11 DIAGNOSIS — L89623 Pressure ulcer of left heel, stage 3: Secondary | ICD-10-CM | POA: Diagnosis not present

## 2014-12-11 DIAGNOSIS — R609 Edema, unspecified: Secondary | ICD-10-CM | POA: Diagnosis not present

## 2014-12-11 DIAGNOSIS — G8222 Paraplegia, incomplete: Secondary | ICD-10-CM | POA: Diagnosis not present

## 2014-12-11 DIAGNOSIS — S24102S Unspecified injury at T2-T6 level of thoracic spinal cord, sequela: Secondary | ICD-10-CM | POA: Diagnosis not present

## 2014-12-11 DIAGNOSIS — Z4800 Encounter for change or removal of nonsurgical wound dressing: Secondary | ICD-10-CM | POA: Diagnosis not present

## 2014-12-11 DIAGNOSIS — L89613 Pressure ulcer of right heel, stage 3: Secondary | ICD-10-CM | POA: Diagnosis not present

## 2014-12-18 DIAGNOSIS — R609 Edema, unspecified: Secondary | ICD-10-CM | POA: Diagnosis not present

## 2014-12-18 DIAGNOSIS — L89613 Pressure ulcer of right heel, stage 3: Secondary | ICD-10-CM | POA: Diagnosis not present

## 2014-12-18 DIAGNOSIS — L89623 Pressure ulcer of left heel, stage 3: Secondary | ICD-10-CM | POA: Diagnosis not present

## 2014-12-18 DIAGNOSIS — S24102S Unspecified injury at T2-T6 level of thoracic spinal cord, sequela: Secondary | ICD-10-CM | POA: Diagnosis not present

## 2014-12-18 DIAGNOSIS — Z4800 Encounter for change or removal of nonsurgical wound dressing: Secondary | ICD-10-CM | POA: Diagnosis not present

## 2014-12-18 DIAGNOSIS — G8222 Paraplegia, incomplete: Secondary | ICD-10-CM | POA: Diagnosis not present

## 2014-12-25 DIAGNOSIS — L89623 Pressure ulcer of left heel, stage 3: Secondary | ICD-10-CM | POA: Diagnosis not present

## 2014-12-25 DIAGNOSIS — G8222 Paraplegia, incomplete: Secondary | ICD-10-CM | POA: Diagnosis not present

## 2014-12-25 DIAGNOSIS — R609 Edema, unspecified: Secondary | ICD-10-CM | POA: Diagnosis not present

## 2014-12-25 DIAGNOSIS — L89613 Pressure ulcer of right heel, stage 3: Secondary | ICD-10-CM | POA: Diagnosis not present

## 2014-12-25 DIAGNOSIS — S24102S Unspecified injury at T2-T6 level of thoracic spinal cord, sequela: Secondary | ICD-10-CM | POA: Diagnosis not present

## 2014-12-25 DIAGNOSIS — Z4800 Encounter for change or removal of nonsurgical wound dressing: Secondary | ICD-10-CM | POA: Diagnosis not present

## 2014-12-26 DIAGNOSIS — R600 Localized edema: Secondary | ICD-10-CM | POA: Diagnosis not present

## 2014-12-26 DIAGNOSIS — G8929 Other chronic pain: Secondary | ICD-10-CM | POA: Diagnosis not present

## 2014-12-26 DIAGNOSIS — F419 Anxiety disorder, unspecified: Secondary | ICD-10-CM | POA: Diagnosis not present

## 2014-12-26 DIAGNOSIS — G839 Paralytic syndrome, unspecified: Secondary | ICD-10-CM | POA: Diagnosis not present

## 2015-01-01 DIAGNOSIS — M1611 Unilateral primary osteoarthritis, right hip: Secondary | ICD-10-CM | POA: Diagnosis not present

## 2015-01-01 DIAGNOSIS — M25551 Pain in right hip: Secondary | ICD-10-CM | POA: Diagnosis not present

## 2015-01-01 DIAGNOSIS — M7061 Trochanteric bursitis, right hip: Secondary | ICD-10-CM | POA: Diagnosis not present

## 2015-01-02 DIAGNOSIS — G8222 Paraplegia, incomplete: Secondary | ICD-10-CM | POA: Diagnosis not present

## 2015-01-02 DIAGNOSIS — R609 Edema, unspecified: Secondary | ICD-10-CM | POA: Diagnosis not present

## 2015-01-02 DIAGNOSIS — Z4800 Encounter for change or removal of nonsurgical wound dressing: Secondary | ICD-10-CM | POA: Diagnosis not present

## 2015-01-02 DIAGNOSIS — S24102S Unspecified injury at T2-T6 level of thoracic spinal cord, sequela: Secondary | ICD-10-CM | POA: Diagnosis not present

## 2015-01-02 DIAGNOSIS — L89613 Pressure ulcer of right heel, stage 3: Secondary | ICD-10-CM | POA: Diagnosis not present

## 2015-01-02 DIAGNOSIS — L89623 Pressure ulcer of left heel, stage 3: Secondary | ICD-10-CM | POA: Diagnosis not present

## 2015-01-09 DIAGNOSIS — L89623 Pressure ulcer of left heel, stage 3: Secondary | ICD-10-CM | POA: Diagnosis not present

## 2015-01-09 DIAGNOSIS — R609 Edema, unspecified: Secondary | ICD-10-CM | POA: Diagnosis not present

## 2015-01-09 DIAGNOSIS — S24102S Unspecified injury at T2-T6 level of thoracic spinal cord, sequela: Secondary | ICD-10-CM | POA: Diagnosis not present

## 2015-01-09 DIAGNOSIS — G8222 Paraplegia, incomplete: Secondary | ICD-10-CM | POA: Diagnosis not present

## 2015-01-09 DIAGNOSIS — L89613 Pressure ulcer of right heel, stage 3: Secondary | ICD-10-CM | POA: Diagnosis not present

## 2015-01-09 DIAGNOSIS — Z4800 Encounter for change or removal of nonsurgical wound dressing: Secondary | ICD-10-CM | POA: Diagnosis not present

## 2015-01-15 DIAGNOSIS — L89613 Pressure ulcer of right heel, stage 3: Secondary | ICD-10-CM | POA: Diagnosis not present

## 2015-01-15 DIAGNOSIS — L97419 Non-pressure chronic ulcer of right heel and midfoot with unspecified severity: Secondary | ICD-10-CM | POA: Diagnosis not present

## 2015-01-15 DIAGNOSIS — L89623 Pressure ulcer of left heel, stage 3: Secondary | ICD-10-CM | POA: Diagnosis not present

## 2015-01-15 DIAGNOSIS — L97429 Non-pressure chronic ulcer of left heel and midfoot with unspecified severity: Secondary | ICD-10-CM | POA: Diagnosis not present

## 2015-01-22 DIAGNOSIS — L89613 Pressure ulcer of right heel, stage 3: Secondary | ICD-10-CM | POA: Diagnosis not present

## 2015-01-22 DIAGNOSIS — G8222 Paraplegia, incomplete: Secondary | ICD-10-CM | POA: Diagnosis not present

## 2015-01-22 DIAGNOSIS — Z4800 Encounter for change or removal of nonsurgical wound dressing: Secondary | ICD-10-CM | POA: Diagnosis not present

## 2015-01-22 DIAGNOSIS — R609 Edema, unspecified: Secondary | ICD-10-CM | POA: Diagnosis not present

## 2015-01-22 DIAGNOSIS — L89623 Pressure ulcer of left heel, stage 3: Secondary | ICD-10-CM | POA: Diagnosis not present

## 2015-01-22 DIAGNOSIS — S24102S Unspecified injury at T2-T6 level of thoracic spinal cord, sequela: Secondary | ICD-10-CM | POA: Diagnosis not present

## 2015-01-23 DIAGNOSIS — M533 Sacrococcygeal disorders, not elsewhere classified: Secondary | ICD-10-CM | POA: Diagnosis not present

## 2015-01-23 DIAGNOSIS — F419 Anxiety disorder, unspecified: Secondary | ICD-10-CM | POA: Diagnosis not present

## 2015-02-07 DIAGNOSIS — Z09 Encounter for follow-up examination after completed treatment for conditions other than malignant neoplasm: Secondary | ICD-10-CM | POA: Diagnosis not present

## 2015-02-07 DIAGNOSIS — G839 Paralytic syndrome, unspecified: Secondary | ICD-10-CM | POA: Diagnosis not present

## 2015-02-07 DIAGNOSIS — L89613 Pressure ulcer of right heel, stage 3: Secondary | ICD-10-CM | POA: Diagnosis not present

## 2015-02-07 DIAGNOSIS — L89623 Pressure ulcer of left heel, stage 3: Secondary | ICD-10-CM | POA: Diagnosis not present

## 2015-02-07 DIAGNOSIS — Z872 Personal history of diseases of the skin and subcutaneous tissue: Secondary | ICD-10-CM | POA: Diagnosis not present

## 2015-02-26 DIAGNOSIS — M5418 Radiculopathy, sacral and sacrococcygeal region: Secondary | ICD-10-CM | POA: Diagnosis not present

## 2015-02-26 DIAGNOSIS — F419 Anxiety disorder, unspecified: Secondary | ICD-10-CM | POA: Diagnosis not present

## 2015-02-26 DIAGNOSIS — M533 Sacrococcygeal disorders, not elsewhere classified: Secondary | ICD-10-CM | POA: Diagnosis not present

## 2015-02-28 DIAGNOSIS — G894 Chronic pain syndrome: Secondary | ICD-10-CM | POA: Diagnosis not present

## 2015-02-28 DIAGNOSIS — Z79899 Other long term (current) drug therapy: Secondary | ICD-10-CM | POA: Diagnosis not present

## 2015-02-28 DIAGNOSIS — M545 Low back pain: Secondary | ICD-10-CM | POA: Diagnosis not present

## 2015-02-28 DIAGNOSIS — M79606 Pain in leg, unspecified: Secondary | ICD-10-CM | POA: Diagnosis not present

## 2015-02-28 DIAGNOSIS — M546 Pain in thoracic spine: Secondary | ICD-10-CM | POA: Diagnosis not present

## 2015-04-02 DIAGNOSIS — M545 Low back pain: Secondary | ICD-10-CM | POA: Diagnosis not present

## 2015-04-02 DIAGNOSIS — M5387 Other specified dorsopathies, lumbosacral region: Secondary | ICD-10-CM | POA: Diagnosis not present

## 2015-04-02 DIAGNOSIS — G894 Chronic pain syndrome: Secondary | ICD-10-CM | POA: Diagnosis not present

## 2015-04-02 DIAGNOSIS — M546 Pain in thoracic spine: Secondary | ICD-10-CM | POA: Diagnosis not present

## 2015-04-26 DIAGNOSIS — F419 Anxiety disorder, unspecified: Secondary | ICD-10-CM | POA: Diagnosis not present

## 2015-04-26 DIAGNOSIS — G839 Paralytic syndrome, unspecified: Secondary | ICD-10-CM | POA: Diagnosis not present

## 2015-04-26 DIAGNOSIS — L0390 Cellulitis, unspecified: Secondary | ICD-10-CM | POA: Diagnosis not present

## 2015-04-26 DIAGNOSIS — M5418 Radiculopathy, sacral and sacrococcygeal region: Secondary | ICD-10-CM | POA: Diagnosis not present

## 2015-04-30 DIAGNOSIS — M545 Low back pain: Secondary | ICD-10-CM | POA: Diagnosis not present

## 2015-04-30 DIAGNOSIS — G894 Chronic pain syndrome: Secondary | ICD-10-CM | POA: Diagnosis not present

## 2015-04-30 DIAGNOSIS — Z79899 Other long term (current) drug therapy: Secondary | ICD-10-CM | POA: Diagnosis not present

## 2015-04-30 DIAGNOSIS — M5387 Other specified dorsopathies, lumbosacral region: Secondary | ICD-10-CM | POA: Diagnosis not present

## 2015-04-30 DIAGNOSIS — M546 Pain in thoracic spine: Secondary | ICD-10-CM | POA: Diagnosis not present

## 2015-04-30 DIAGNOSIS — E669 Obesity, unspecified: Secondary | ICD-10-CM | POA: Diagnosis not present

## 2015-05-10 DIAGNOSIS — M47817 Spondylosis without myelopathy or radiculopathy, lumbosacral region: Secondary | ICD-10-CM | POA: Diagnosis not present

## 2015-05-10 DIAGNOSIS — M5126 Other intervertebral disc displacement, lumbar region: Secondary | ICD-10-CM | POA: Diagnosis not present

## 2015-05-10 DIAGNOSIS — M5116 Intervertebral disc disorders with radiculopathy, lumbar region: Secondary | ICD-10-CM | POA: Diagnosis not present

## 2015-05-10 DIAGNOSIS — N1330 Unspecified hydronephrosis: Secondary | ICD-10-CM | POA: Diagnosis not present

## 2015-05-28 DIAGNOSIS — G894 Chronic pain syndrome: Secondary | ICD-10-CM | POA: Diagnosis not present

## 2015-05-28 DIAGNOSIS — R600 Localized edema: Secondary | ICD-10-CM | POA: Diagnosis not present

## 2015-05-28 DIAGNOSIS — F419 Anxiety disorder, unspecified: Secondary | ICD-10-CM | POA: Diagnosis not present

## 2015-05-28 DIAGNOSIS — I872 Venous insufficiency (chronic) (peripheral): Secondary | ICD-10-CM | POA: Diagnosis not present

## 2015-06-25 DIAGNOSIS — M546 Pain in thoracic spine: Secondary | ICD-10-CM | POA: Diagnosis not present

## 2015-06-25 DIAGNOSIS — Z79899 Other long term (current) drug therapy: Secondary | ICD-10-CM | POA: Diagnosis not present

## 2015-06-25 DIAGNOSIS — G894 Chronic pain syndrome: Secondary | ICD-10-CM | POA: Diagnosis not present

## 2015-06-25 DIAGNOSIS — M545 Low back pain: Secondary | ICD-10-CM | POA: Diagnosis not present

## 2015-06-25 DIAGNOSIS — M1288 Other specific arthropathies, not elsewhere classified, other specified site: Secondary | ICD-10-CM | POA: Diagnosis not present

## 2015-06-27 DIAGNOSIS — G8929 Other chronic pain: Secondary | ICD-10-CM | POA: Diagnosis not present

## 2015-06-27 DIAGNOSIS — G8221 Paraplegia, complete: Secondary | ICD-10-CM | POA: Diagnosis not present

## 2015-06-27 DIAGNOSIS — M47814 Spondylosis without myelopathy or radiculopathy, thoracic region: Secondary | ICD-10-CM | POA: Diagnosis not present

## 2015-06-27 DIAGNOSIS — R600 Localized edema: Secondary | ICD-10-CM | POA: Diagnosis not present

## 2015-06-27 DIAGNOSIS — G839 Paralytic syndrome, unspecified: Secondary | ICD-10-CM | POA: Diagnosis not present

## 2015-06-27 DIAGNOSIS — Z982 Presence of cerebrospinal fluid drainage device: Secondary | ICD-10-CM | POA: Diagnosis not present

## 2015-06-27 DIAGNOSIS — M5418 Radiculopathy, sacral and sacrococcygeal region: Secondary | ICD-10-CM | POA: Diagnosis not present

## 2015-07-02 DIAGNOSIS — R600 Localized edema: Secondary | ICD-10-CM | POA: Diagnosis not present

## 2015-07-02 DIAGNOSIS — M5418 Radiculopathy, sacral and sacrococcygeal region: Secondary | ICD-10-CM | POA: Diagnosis not present

## 2015-07-02 DIAGNOSIS — M533 Sacrococcygeal disorders, not elsewhere classified: Secondary | ICD-10-CM | POA: Diagnosis not present

## 2015-07-02 DIAGNOSIS — I872 Venous insufficiency (chronic) (peripheral): Secondary | ICD-10-CM | POA: Diagnosis not present

## 2015-07-02 DIAGNOSIS — F419 Anxiety disorder, unspecified: Secondary | ICD-10-CM | POA: Diagnosis not present

## 2015-07-02 DIAGNOSIS — N200 Calculus of kidney: Secondary | ICD-10-CM | POA: Diagnosis not present

## 2015-07-23 DIAGNOSIS — M79606 Pain in leg, unspecified: Secondary | ICD-10-CM | POA: Diagnosis not present

## 2015-07-23 DIAGNOSIS — G894 Chronic pain syndrome: Secondary | ICD-10-CM | POA: Diagnosis not present

## 2015-07-23 DIAGNOSIS — M1288 Other specific arthropathies, not elsewhere classified, other specified site: Secondary | ICD-10-CM | POA: Diagnosis not present

## 2015-07-23 DIAGNOSIS — E669 Obesity, unspecified: Secondary | ICD-10-CM | POA: Diagnosis not present

## 2015-07-23 DIAGNOSIS — M546 Pain in thoracic spine: Secondary | ICD-10-CM | POA: Diagnosis not present

## 2015-07-30 DIAGNOSIS — I872 Venous insufficiency (chronic) (peripheral): Secondary | ICD-10-CM | POA: Diagnosis not present

## 2015-07-30 DIAGNOSIS — M533 Sacrococcygeal disorders, not elsewhere classified: Secondary | ICD-10-CM | POA: Diagnosis not present

## 2015-07-30 DIAGNOSIS — R600 Localized edema: Secondary | ICD-10-CM | POA: Diagnosis not present

## 2015-07-30 DIAGNOSIS — N200 Calculus of kidney: Secondary | ICD-10-CM | POA: Diagnosis not present

## 2015-07-30 DIAGNOSIS — L97529 Non-pressure chronic ulcer of other part of left foot with unspecified severity: Secondary | ICD-10-CM | POA: Diagnosis not present

## 2015-07-30 DIAGNOSIS — F419 Anxiety disorder, unspecified: Secondary | ICD-10-CM | POA: Diagnosis not present

## 2015-07-30 DIAGNOSIS — M5418 Radiculopathy, sacral and sacrococcygeal region: Secondary | ICD-10-CM | POA: Diagnosis not present

## 2015-08-20 DIAGNOSIS — R600 Localized edema: Secondary | ICD-10-CM | POA: Diagnosis not present

## 2015-08-20 DIAGNOSIS — G950 Syringomyelia and syringobulbia: Secondary | ICD-10-CM | POA: Diagnosis not present

## 2015-08-20 DIAGNOSIS — N132 Hydronephrosis with renal and ureteral calculous obstruction: Secondary | ICD-10-CM | POA: Diagnosis not present

## 2015-08-20 DIAGNOSIS — Z91040 Latex allergy status: Secondary | ICD-10-CM | POA: Diagnosis not present

## 2015-08-20 DIAGNOSIS — F17200 Nicotine dependence, unspecified, uncomplicated: Secondary | ICD-10-CM | POA: Diagnosis not present

## 2015-08-20 DIAGNOSIS — Z79899 Other long term (current) drug therapy: Secondary | ICD-10-CM | POA: Diagnosis not present

## 2015-08-20 DIAGNOSIS — N201 Calculus of ureter: Secondary | ICD-10-CM | POA: Diagnosis not present

## 2015-08-21 DIAGNOSIS — N132 Hydronephrosis with renal and ureteral calculous obstruction: Secondary | ICD-10-CM | POA: Diagnosis not present

## 2015-08-21 DIAGNOSIS — Z91040 Latex allergy status: Secondary | ICD-10-CM | POA: Diagnosis not present

## 2015-08-21 DIAGNOSIS — N209 Urinary calculus, unspecified: Secondary | ICD-10-CM | POA: Diagnosis not present

## 2015-08-21 DIAGNOSIS — N201 Calculus of ureter: Secondary | ICD-10-CM | POA: Diagnosis not present

## 2015-08-21 DIAGNOSIS — F17200 Nicotine dependence, unspecified, uncomplicated: Secondary | ICD-10-CM | POA: Diagnosis not present

## 2015-08-21 DIAGNOSIS — G950 Syringomyelia and syringobulbia: Secondary | ICD-10-CM | POA: Diagnosis not present

## 2015-08-21 DIAGNOSIS — Z79899 Other long term (current) drug therapy: Secondary | ICD-10-CM | POA: Diagnosis not present

## 2015-08-30 DIAGNOSIS — Z79899 Other long term (current) drug therapy: Secondary | ICD-10-CM | POA: Diagnosis not present

## 2015-08-30 DIAGNOSIS — G894 Chronic pain syndrome: Secondary | ICD-10-CM | POA: Diagnosis not present

## 2015-08-30 DIAGNOSIS — M545 Low back pain: Secondary | ICD-10-CM | POA: Diagnosis not present

## 2015-08-30 DIAGNOSIS — M1288 Other specific arthropathies, not elsewhere classified, other specified site: Secondary | ICD-10-CM | POA: Diagnosis not present

## 2015-08-30 DIAGNOSIS — M546 Pain in thoracic spine: Secondary | ICD-10-CM | POA: Diagnosis not present

## 2015-08-30 DIAGNOSIS — E669 Obesity, unspecified: Secondary | ICD-10-CM | POA: Diagnosis not present

## 2015-09-11 DIAGNOSIS — N201 Calculus of ureter: Secondary | ICD-10-CM | POA: Diagnosis not present

## 2015-09-13 DIAGNOSIS — N1330 Unspecified hydronephrosis: Secondary | ICD-10-CM | POA: Diagnosis not present

## 2015-09-13 DIAGNOSIS — N200 Calculus of kidney: Secondary | ICD-10-CM | POA: Diagnosis not present

## 2015-09-19 DIAGNOSIS — N135 Crossing vessel and stricture of ureter without hydronephrosis: Secondary | ICD-10-CM | POA: Diagnosis not present

## 2015-09-19 DIAGNOSIS — Z936 Other artificial openings of urinary tract status: Secondary | ICD-10-CM | POA: Diagnosis not present

## 2015-09-19 DIAGNOSIS — N132 Hydronephrosis with renal and ureteral calculous obstruction: Secondary | ICD-10-CM | POA: Diagnosis not present

## 2015-09-19 DIAGNOSIS — N1330 Unspecified hydronephrosis: Secondary | ICD-10-CM | POA: Diagnosis not present

## 2015-09-20 DIAGNOSIS — N1330 Unspecified hydronephrosis: Secondary | ICD-10-CM | POA: Insufficient documentation

## 2015-09-26 DIAGNOSIS — M546 Pain in thoracic spine: Secondary | ICD-10-CM | POA: Diagnosis not present

## 2015-09-26 DIAGNOSIS — M79606 Pain in leg, unspecified: Secondary | ICD-10-CM | POA: Diagnosis not present

## 2015-09-26 DIAGNOSIS — Z79899 Other long term (current) drug therapy: Secondary | ICD-10-CM | POA: Diagnosis not present

## 2015-09-26 DIAGNOSIS — E669 Obesity, unspecified: Secondary | ICD-10-CM | POA: Diagnosis not present

## 2015-09-26 DIAGNOSIS — G894 Chronic pain syndrome: Secondary | ICD-10-CM | POA: Diagnosis not present

## 2015-09-26 DIAGNOSIS — M1288 Other specific arthropathies, not elsewhere classified, other specified site: Secondary | ICD-10-CM | POA: Diagnosis not present

## 2015-09-26 DIAGNOSIS — M545 Low back pain: Secondary | ICD-10-CM | POA: Diagnosis not present

## 2015-10-01 DIAGNOSIS — N1330 Unspecified hydronephrosis: Secondary | ICD-10-CM | POA: Diagnosis not present

## 2015-10-01 DIAGNOSIS — Z960 Presence of urogenital implants: Secondary | ICD-10-CM | POA: Diagnosis not present

## 2015-10-01 DIAGNOSIS — N280 Ischemia and infarction of kidney: Secondary | ICD-10-CM | POA: Diagnosis not present

## 2015-10-11 DIAGNOSIS — N200 Calculus of kidney: Secondary | ICD-10-CM | POA: Diagnosis not present

## 2015-10-11 DIAGNOSIS — N132 Hydronephrosis with renal and ureteral calculous obstruction: Secondary | ICD-10-CM | POA: Diagnosis not present

## 2015-10-11 DIAGNOSIS — Z936 Other artificial openings of urinary tract status: Secondary | ICD-10-CM | POA: Diagnosis not present

## 2015-10-11 DIAGNOSIS — F17210 Nicotine dependence, cigarettes, uncomplicated: Secondary | ICD-10-CM | POA: Diagnosis not present

## 2015-10-17 DIAGNOSIS — N201 Calculus of ureter: Secondary | ICD-10-CM | POA: Diagnosis not present

## 2015-10-17 DIAGNOSIS — N1330 Unspecified hydronephrosis: Secondary | ICD-10-CM | POA: Diagnosis not present

## 2015-10-17 DIAGNOSIS — Z466 Encounter for fitting and adjustment of urinary device: Secondary | ICD-10-CM | POA: Diagnosis not present

## 2015-10-24 DIAGNOSIS — Z79899 Other long term (current) drug therapy: Secondary | ICD-10-CM | POA: Diagnosis not present

## 2015-10-24 DIAGNOSIS — E669 Obesity, unspecified: Secondary | ICD-10-CM | POA: Diagnosis not present

## 2015-10-24 DIAGNOSIS — G894 Chronic pain syndrome: Secondary | ICD-10-CM | POA: Diagnosis not present

## 2015-10-24 DIAGNOSIS — M1288 Other specific arthropathies, not elsewhere classified, other specified site: Secondary | ICD-10-CM | POA: Diagnosis not present

## 2015-10-24 DIAGNOSIS — M546 Pain in thoracic spine: Secondary | ICD-10-CM | POA: Diagnosis not present

## 2015-11-21 DIAGNOSIS — M1288 Other specific arthropathies, not elsewhere classified, other specified site: Secondary | ICD-10-CM | POA: Diagnosis not present

## 2015-11-21 DIAGNOSIS — Z79899 Other long term (current) drug therapy: Secondary | ICD-10-CM | POA: Diagnosis not present

## 2015-11-21 DIAGNOSIS — E669 Obesity, unspecified: Secondary | ICD-10-CM | POA: Diagnosis not present

## 2015-11-21 DIAGNOSIS — M546 Pain in thoracic spine: Secondary | ICD-10-CM | POA: Diagnosis not present

## 2015-11-21 DIAGNOSIS — G894 Chronic pain syndrome: Secondary | ICD-10-CM | POA: Diagnosis not present

## 2015-12-19 DIAGNOSIS — M546 Pain in thoracic spine: Secondary | ICD-10-CM | POA: Diagnosis not present

## 2015-12-19 DIAGNOSIS — Z79899 Other long term (current) drug therapy: Secondary | ICD-10-CM | POA: Diagnosis not present

## 2015-12-19 DIAGNOSIS — G894 Chronic pain syndrome: Secondary | ICD-10-CM | POA: Diagnosis not present

## 2015-12-19 DIAGNOSIS — E669 Obesity, unspecified: Secondary | ICD-10-CM | POA: Diagnosis not present

## 2015-12-19 DIAGNOSIS — M1288 Other specific arthropathies, not elsewhere classified, other specified site: Secondary | ICD-10-CM | POA: Diagnosis not present

## 2015-12-19 DIAGNOSIS — M545 Low back pain: Secondary | ICD-10-CM | POA: Diagnosis not present

## 2016-01-20 DIAGNOSIS — M62838 Other muscle spasm: Secondary | ICD-10-CM | POA: Diagnosis not present

## 2016-01-20 DIAGNOSIS — N319 Neuromuscular dysfunction of bladder, unspecified: Secondary | ICD-10-CM | POA: Diagnosis not present

## 2016-01-20 DIAGNOSIS — F909 Attention-deficit hyperactivity disorder, unspecified type: Secondary | ICD-10-CM | POA: Diagnosis not present

## 2016-01-20 DIAGNOSIS — L89329 Pressure ulcer of left buttock, unspecified stage: Secondary | ICD-10-CM | POA: Diagnosis not present

## 2016-01-20 DIAGNOSIS — F902 Attention-deficit hyperactivity disorder, combined type: Secondary | ICD-10-CM | POA: Diagnosis not present

## 2016-01-20 DIAGNOSIS — L89324 Pressure ulcer of left buttock, stage 4: Secondary | ICD-10-CM | POA: Diagnosis not present

## 2016-01-20 DIAGNOSIS — F329 Major depressive disorder, single episode, unspecified: Secondary | ICD-10-CM | POA: Diagnosis not present

## 2016-01-20 DIAGNOSIS — L8990 Pressure ulcer of unspecified site, unspecified stage: Secondary | ICD-10-CM | POA: Diagnosis not present

## 2016-01-20 DIAGNOSIS — F339 Major depressive disorder, recurrent, unspecified: Secondary | ICD-10-CM | POA: Diagnosis not present

## 2016-01-20 DIAGNOSIS — M545 Low back pain: Secondary | ICD-10-CM | POA: Diagnosis not present

## 2016-01-20 DIAGNOSIS — Z960 Presence of urogenital implants: Secondary | ICD-10-CM | POA: Diagnosis not present

## 2016-01-20 DIAGNOSIS — S24119A Complete lesion at unspecified level of thoracic spinal cord, initial encounter: Secondary | ICD-10-CM | POA: Diagnosis not present

## 2016-01-20 DIAGNOSIS — Y738 Miscellaneous gastroenterology and urology devices associated with adverse incidents, not elsewhere classified: Secondary | ICD-10-CM | POA: Diagnosis not present

## 2016-01-20 DIAGNOSIS — N318 Other neuromuscular dysfunction of bladder: Secondary | ICD-10-CM | POA: Diagnosis not present

## 2016-01-20 DIAGNOSIS — R279 Unspecified lack of coordination: Secondary | ICD-10-CM | POA: Diagnosis not present

## 2016-01-20 DIAGNOSIS — N509 Disorder of male genital organs, unspecified: Secondary | ICD-10-CM | POA: Diagnosis not present

## 2016-01-20 DIAGNOSIS — F413 Other mixed anxiety disorders: Secondary | ICD-10-CM | POA: Diagnosis not present

## 2016-01-20 DIAGNOSIS — N1330 Unspecified hydronephrosis: Secondary | ICD-10-CM | POA: Diagnosis not present

## 2016-01-20 DIAGNOSIS — M25551 Pain in right hip: Secondary | ICD-10-CM | POA: Diagnosis not present

## 2016-01-20 DIAGNOSIS — G950 Syringomyelia and syringobulbia: Secondary | ICD-10-CM | POA: Diagnosis not present

## 2016-01-20 DIAGNOSIS — Y846 Urinary catheterization as the cause of abnormal reaction of the patient, or of later complication, without mention of misadventure at the time of the procedure: Secondary | ICD-10-CM | POA: Diagnosis not present

## 2016-01-20 DIAGNOSIS — N200 Calculus of kidney: Secondary | ICD-10-CM | POA: Diagnosis not present

## 2016-01-20 DIAGNOSIS — G8929 Other chronic pain: Secondary | ICD-10-CM | POA: Diagnosis not present

## 2016-01-20 DIAGNOSIS — Z7401 Bed confinement status: Secondary | ICD-10-CM | POA: Diagnosis not present

## 2016-01-20 DIAGNOSIS — F418 Other specified anxiety disorders: Secondary | ICD-10-CM | POA: Diagnosis not present

## 2016-01-20 DIAGNOSIS — N485 Ulcer of penis: Secondary | ICD-10-CM | POA: Diagnosis not present

## 2016-01-20 DIAGNOSIS — K219 Gastro-esophageal reflux disease without esophagitis: Secondary | ICD-10-CM | POA: Diagnosis not present

## 2016-01-20 DIAGNOSIS — N489 Disorder of penis, unspecified: Secondary | ICD-10-CM | POA: Diagnosis not present

## 2016-01-20 DIAGNOSIS — Z466 Encounter for fitting and adjustment of urinary device: Secondary | ICD-10-CM | POA: Diagnosis not present

## 2016-01-20 DIAGNOSIS — L89224 Pressure ulcer of left hip, stage 4: Secondary | ICD-10-CM | POA: Diagnosis not present

## 2016-01-24 DIAGNOSIS — M545 Low back pain: Secondary | ICD-10-CM | POA: Diagnosis not present

## 2016-01-24 DIAGNOSIS — R52 Pain, unspecified: Secondary | ICD-10-CM | POA: Diagnosis not present

## 2016-01-24 DIAGNOSIS — F413 Other mixed anxiety disorders: Secondary | ICD-10-CM | POA: Diagnosis not present

## 2016-01-24 DIAGNOSIS — N485 Ulcer of penis: Secondary | ICD-10-CM | POA: Diagnosis not present

## 2016-01-24 DIAGNOSIS — R279 Unspecified lack of coordination: Secondary | ICD-10-CM | POA: Diagnosis not present

## 2016-01-24 DIAGNOSIS — L89324 Pressure ulcer of left buttock, stage 4: Secondary | ICD-10-CM | POA: Diagnosis not present

## 2016-01-24 DIAGNOSIS — M62838 Other muscle spasm: Secondary | ICD-10-CM | POA: Diagnosis not present

## 2016-01-24 DIAGNOSIS — L8990 Pressure ulcer of unspecified site, unspecified stage: Secondary | ICD-10-CM | POA: Diagnosis not present

## 2016-01-24 DIAGNOSIS — M5126 Other intervertebral disc displacement, lumbar region: Secondary | ICD-10-CM | POA: Diagnosis not present

## 2016-01-24 DIAGNOSIS — M5124 Other intervertebral disc displacement, thoracic region: Secondary | ICD-10-CM | POA: Diagnosis not present

## 2016-01-24 DIAGNOSIS — M62830 Muscle spasm of back: Secondary | ICD-10-CM | POA: Diagnosis not present

## 2016-01-24 DIAGNOSIS — M25551 Pain in right hip: Secondary | ICD-10-CM | POA: Diagnosis not present

## 2016-01-24 DIAGNOSIS — N1330 Unspecified hydronephrosis: Secondary | ICD-10-CM | POA: Diagnosis not present

## 2016-01-24 DIAGNOSIS — Z79899 Other long term (current) drug therapy: Secondary | ICD-10-CM | POA: Diagnosis not present

## 2016-01-24 DIAGNOSIS — R918 Other nonspecific abnormal finding of lung field: Secondary | ICD-10-CM | POA: Diagnosis not present

## 2016-01-24 DIAGNOSIS — K219 Gastro-esophageal reflux disease without esophagitis: Secondary | ICD-10-CM | POA: Diagnosis not present

## 2016-01-24 DIAGNOSIS — F339 Major depressive disorder, recurrent, unspecified: Secondary | ICD-10-CM | POA: Diagnosis not present

## 2016-01-24 DIAGNOSIS — M549 Dorsalgia, unspecified: Secondary | ICD-10-CM | POA: Diagnosis not present

## 2016-01-24 DIAGNOSIS — S24119A Complete lesion at unspecified level of thoracic spinal cord, initial encounter: Secondary | ICD-10-CM | POA: Diagnosis not present

## 2016-01-24 DIAGNOSIS — G8929 Other chronic pain: Secondary | ICD-10-CM | POA: Diagnosis not present

## 2016-01-24 DIAGNOSIS — M79606 Pain in leg, unspecified: Secondary | ICD-10-CM | POA: Diagnosis not present

## 2016-01-24 DIAGNOSIS — G950 Syringomyelia and syringobulbia: Secondary | ICD-10-CM | POA: Diagnosis not present

## 2016-01-24 DIAGNOSIS — Z7401 Bed confinement status: Secondary | ICD-10-CM | POA: Diagnosis not present

## 2016-01-24 DIAGNOSIS — F902 Attention-deficit hyperactivity disorder, combined type: Secondary | ICD-10-CM | POA: Diagnosis not present

## 2016-01-30 DIAGNOSIS — Z79899 Other long term (current) drug therapy: Secondary | ICD-10-CM | POA: Diagnosis not present

## 2016-01-30 DIAGNOSIS — M549 Dorsalgia, unspecified: Secondary | ICD-10-CM | POA: Diagnosis not present

## 2016-01-30 DIAGNOSIS — R918 Other nonspecific abnormal finding of lung field: Secondary | ICD-10-CM | POA: Diagnosis not present

## 2016-01-30 DIAGNOSIS — M5124 Other intervertebral disc displacement, thoracic region: Secondary | ICD-10-CM | POA: Diagnosis not present

## 2016-01-30 DIAGNOSIS — L89324 Pressure ulcer of left buttock, stage 4: Secondary | ICD-10-CM | POA: Diagnosis not present

## 2016-01-30 DIAGNOSIS — G950 Syringomyelia and syringobulbia: Secondary | ICD-10-CM | POA: Diagnosis not present

## 2016-01-30 DIAGNOSIS — M545 Low back pain: Secondary | ICD-10-CM | POA: Diagnosis not present

## 2016-01-30 DIAGNOSIS — M5126 Other intervertebral disc displacement, lumbar region: Secondary | ICD-10-CM | POA: Diagnosis not present

## 2016-01-30 DIAGNOSIS — G8929 Other chronic pain: Secondary | ICD-10-CM | POA: Diagnosis not present

## 2016-01-31 DIAGNOSIS — Z7401 Bed confinement status: Secondary | ICD-10-CM | POA: Diagnosis not present

## 2016-01-31 DIAGNOSIS — G950 Syringomyelia and syringobulbia: Secondary | ICD-10-CM | POA: Diagnosis not present

## 2016-01-31 DIAGNOSIS — L8990 Pressure ulcer of unspecified site, unspecified stage: Secondary | ICD-10-CM | POA: Diagnosis not present

## 2016-01-31 DIAGNOSIS — M545 Low back pain: Secondary | ICD-10-CM | POA: Diagnosis not present

## 2016-01-31 DIAGNOSIS — R279 Unspecified lack of coordination: Secondary | ICD-10-CM | POA: Diagnosis not present

## 2016-02-05 DIAGNOSIS — Z6831 Body mass index (BMI) 31.0-31.9, adult: Secondary | ICD-10-CM | POA: Diagnosis not present

## 2016-02-05 DIAGNOSIS — E669 Obesity, unspecified: Secondary | ICD-10-CM | POA: Diagnosis not present

## 2016-02-05 DIAGNOSIS — Z4800 Encounter for change or removal of nonsurgical wound dressing: Secondary | ICD-10-CM | POA: Diagnosis not present

## 2016-02-05 DIAGNOSIS — G950 Syringomyelia and syringobulbia: Secondary | ICD-10-CM | POA: Diagnosis not present

## 2016-02-05 DIAGNOSIS — L89324 Pressure ulcer of left buttock, stage 4: Secondary | ICD-10-CM | POA: Diagnosis not present

## 2016-02-05 DIAGNOSIS — Z466 Encounter for fitting and adjustment of urinary device: Secondary | ICD-10-CM | POA: Diagnosis not present

## 2016-02-05 DIAGNOSIS — M549 Dorsalgia, unspecified: Secondary | ICD-10-CM | POA: Diagnosis not present

## 2016-02-05 DIAGNOSIS — M5489 Other dorsalgia: Secondary | ICD-10-CM | POA: Diagnosis not present

## 2016-02-07 DIAGNOSIS — E669 Obesity, unspecified: Secondary | ICD-10-CM | POA: Diagnosis not present

## 2016-02-07 DIAGNOSIS — G950 Syringomyelia and syringobulbia: Secondary | ICD-10-CM | POA: Diagnosis not present

## 2016-02-07 DIAGNOSIS — M549 Dorsalgia, unspecified: Secondary | ICD-10-CM | POA: Diagnosis not present

## 2016-02-07 DIAGNOSIS — Z6831 Body mass index (BMI) 31.0-31.9, adult: Secondary | ICD-10-CM | POA: Diagnosis not present

## 2016-02-07 DIAGNOSIS — Z466 Encounter for fitting and adjustment of urinary device: Secondary | ICD-10-CM | POA: Diagnosis not present

## 2016-02-07 DIAGNOSIS — L89324 Pressure ulcer of left buttock, stage 4: Secondary | ICD-10-CM | POA: Diagnosis not present

## 2016-02-10 DIAGNOSIS — M549 Dorsalgia, unspecified: Secondary | ICD-10-CM | POA: Diagnosis not present

## 2016-02-10 DIAGNOSIS — Z466 Encounter for fitting and adjustment of urinary device: Secondary | ICD-10-CM | POA: Diagnosis not present

## 2016-02-10 DIAGNOSIS — E669 Obesity, unspecified: Secondary | ICD-10-CM | POA: Diagnosis not present

## 2016-02-10 DIAGNOSIS — Z6831 Body mass index (BMI) 31.0-31.9, adult: Secondary | ICD-10-CM | POA: Diagnosis not present

## 2016-02-10 DIAGNOSIS — L89324 Pressure ulcer of left buttock, stage 4: Secondary | ICD-10-CM | POA: Diagnosis not present

## 2016-02-10 DIAGNOSIS — G950 Syringomyelia and syringobulbia: Secondary | ICD-10-CM | POA: Diagnosis not present

## 2016-02-12 DIAGNOSIS — E669 Obesity, unspecified: Secondary | ICD-10-CM | POA: Diagnosis not present

## 2016-02-12 DIAGNOSIS — Z466 Encounter for fitting and adjustment of urinary device: Secondary | ICD-10-CM | POA: Diagnosis not present

## 2016-02-12 DIAGNOSIS — L89324 Pressure ulcer of left buttock, stage 4: Secondary | ICD-10-CM | POA: Diagnosis not present

## 2016-02-12 DIAGNOSIS — M549 Dorsalgia, unspecified: Secondary | ICD-10-CM | POA: Diagnosis not present

## 2016-02-12 DIAGNOSIS — G950 Syringomyelia and syringobulbia: Secondary | ICD-10-CM | POA: Diagnosis not present

## 2016-02-12 DIAGNOSIS — Z6831 Body mass index (BMI) 31.0-31.9, adult: Secondary | ICD-10-CM | POA: Diagnosis not present

## 2016-02-17 DIAGNOSIS — Z466 Encounter for fitting and adjustment of urinary device: Secondary | ICD-10-CM | POA: Diagnosis not present

## 2016-02-17 DIAGNOSIS — L89324 Pressure ulcer of left buttock, stage 4: Secondary | ICD-10-CM | POA: Diagnosis not present

## 2016-02-17 DIAGNOSIS — M549 Dorsalgia, unspecified: Secondary | ICD-10-CM | POA: Diagnosis not present

## 2016-02-17 DIAGNOSIS — E669 Obesity, unspecified: Secondary | ICD-10-CM | POA: Diagnosis not present

## 2016-02-17 DIAGNOSIS — G950 Syringomyelia and syringobulbia: Secondary | ICD-10-CM | POA: Diagnosis not present

## 2016-02-17 DIAGNOSIS — Z6831 Body mass index (BMI) 31.0-31.9, adult: Secondary | ICD-10-CM | POA: Diagnosis not present

## 2016-02-18 DIAGNOSIS — G950 Syringomyelia and syringobulbia: Secondary | ICD-10-CM | POA: Diagnosis not present

## 2016-02-18 DIAGNOSIS — Z466 Encounter for fitting and adjustment of urinary device: Secondary | ICD-10-CM | POA: Diagnosis not present

## 2016-02-18 DIAGNOSIS — Z6831 Body mass index (BMI) 31.0-31.9, adult: Secondary | ICD-10-CM | POA: Diagnosis not present

## 2016-02-18 DIAGNOSIS — M549 Dorsalgia, unspecified: Secondary | ICD-10-CM | POA: Diagnosis not present

## 2016-02-18 DIAGNOSIS — E669 Obesity, unspecified: Secondary | ICD-10-CM | POA: Diagnosis not present

## 2016-02-18 DIAGNOSIS — L89324 Pressure ulcer of left buttock, stage 4: Secondary | ICD-10-CM | POA: Diagnosis not present

## 2016-02-19 DIAGNOSIS — Z466 Encounter for fitting and adjustment of urinary device: Secondary | ICD-10-CM | POA: Diagnosis not present

## 2016-02-19 DIAGNOSIS — E669 Obesity, unspecified: Secondary | ICD-10-CM | POA: Diagnosis not present

## 2016-02-19 DIAGNOSIS — L89324 Pressure ulcer of left buttock, stage 4: Secondary | ICD-10-CM | POA: Diagnosis not present

## 2016-02-19 DIAGNOSIS — G950 Syringomyelia and syringobulbia: Secondary | ICD-10-CM | POA: Diagnosis not present

## 2016-02-19 DIAGNOSIS — M549 Dorsalgia, unspecified: Secondary | ICD-10-CM | POA: Diagnosis not present

## 2016-02-19 DIAGNOSIS — Z6831 Body mass index (BMI) 31.0-31.9, adult: Secondary | ICD-10-CM | POA: Diagnosis not present

## 2016-02-21 DIAGNOSIS — Z6831 Body mass index (BMI) 31.0-31.9, adult: Secondary | ICD-10-CM | POA: Diagnosis not present

## 2016-02-21 DIAGNOSIS — Z466 Encounter for fitting and adjustment of urinary device: Secondary | ICD-10-CM | POA: Diagnosis not present

## 2016-02-21 DIAGNOSIS — M549 Dorsalgia, unspecified: Secondary | ICD-10-CM | POA: Diagnosis not present

## 2016-02-21 DIAGNOSIS — L89324 Pressure ulcer of left buttock, stage 4: Secondary | ICD-10-CM | POA: Diagnosis not present

## 2016-02-21 DIAGNOSIS — E669 Obesity, unspecified: Secondary | ICD-10-CM | POA: Diagnosis not present

## 2016-02-21 DIAGNOSIS — G950 Syringomyelia and syringobulbia: Secondary | ICD-10-CM | POA: Diagnosis not present

## 2016-02-24 DIAGNOSIS — E669 Obesity, unspecified: Secondary | ICD-10-CM | POA: Diagnosis not present

## 2016-02-24 DIAGNOSIS — M549 Dorsalgia, unspecified: Secondary | ICD-10-CM | POA: Diagnosis not present

## 2016-02-24 DIAGNOSIS — L89324 Pressure ulcer of left buttock, stage 4: Secondary | ICD-10-CM | POA: Diagnosis not present

## 2016-02-24 DIAGNOSIS — Z466 Encounter for fitting and adjustment of urinary device: Secondary | ICD-10-CM | POA: Diagnosis not present

## 2016-02-24 DIAGNOSIS — Z6831 Body mass index (BMI) 31.0-31.9, adult: Secondary | ICD-10-CM | POA: Diagnosis not present

## 2016-02-24 DIAGNOSIS — G950 Syringomyelia and syringobulbia: Secondary | ICD-10-CM | POA: Diagnosis not present

## 2016-02-26 DIAGNOSIS — G950 Syringomyelia and syringobulbia: Secondary | ICD-10-CM | POA: Diagnosis not present

## 2016-02-26 DIAGNOSIS — Z466 Encounter for fitting and adjustment of urinary device: Secondary | ICD-10-CM | POA: Diagnosis not present

## 2016-02-26 DIAGNOSIS — L89324 Pressure ulcer of left buttock, stage 4: Secondary | ICD-10-CM | POA: Diagnosis not present

## 2016-02-26 DIAGNOSIS — E669 Obesity, unspecified: Secondary | ICD-10-CM | POA: Diagnosis not present

## 2016-02-26 DIAGNOSIS — Z6831 Body mass index (BMI) 31.0-31.9, adult: Secondary | ICD-10-CM | POA: Diagnosis not present

## 2016-02-26 DIAGNOSIS — M549 Dorsalgia, unspecified: Secondary | ICD-10-CM | POA: Diagnosis not present

## 2016-02-28 DIAGNOSIS — G950 Syringomyelia and syringobulbia: Secondary | ICD-10-CM | POA: Diagnosis not present

## 2016-02-28 DIAGNOSIS — L89324 Pressure ulcer of left buttock, stage 4: Secondary | ICD-10-CM | POA: Diagnosis not present

## 2016-02-28 DIAGNOSIS — Z6831 Body mass index (BMI) 31.0-31.9, adult: Secondary | ICD-10-CM | POA: Diagnosis not present

## 2016-02-28 DIAGNOSIS — Z466 Encounter for fitting and adjustment of urinary device: Secondary | ICD-10-CM | POA: Diagnosis not present

## 2016-02-28 DIAGNOSIS — M549 Dorsalgia, unspecified: Secondary | ICD-10-CM | POA: Diagnosis not present

## 2016-02-28 DIAGNOSIS — E669 Obesity, unspecified: Secondary | ICD-10-CM | POA: Diagnosis not present

## 2016-03-02 DIAGNOSIS — E669 Obesity, unspecified: Secondary | ICD-10-CM | POA: Diagnosis not present

## 2016-03-02 DIAGNOSIS — Z6831 Body mass index (BMI) 31.0-31.9, adult: Secondary | ICD-10-CM | POA: Diagnosis not present

## 2016-03-02 DIAGNOSIS — G950 Syringomyelia and syringobulbia: Secondary | ICD-10-CM | POA: Diagnosis not present

## 2016-03-02 DIAGNOSIS — M549 Dorsalgia, unspecified: Secondary | ICD-10-CM | POA: Diagnosis not present

## 2016-03-02 DIAGNOSIS — L89324 Pressure ulcer of left buttock, stage 4: Secondary | ICD-10-CM | POA: Diagnosis not present

## 2016-03-02 DIAGNOSIS — Z466 Encounter for fitting and adjustment of urinary device: Secondary | ICD-10-CM | POA: Diagnosis not present

## 2016-03-04 DIAGNOSIS — M549 Dorsalgia, unspecified: Secondary | ICD-10-CM | POA: Diagnosis not present

## 2016-03-04 DIAGNOSIS — L89324 Pressure ulcer of left buttock, stage 4: Secondary | ICD-10-CM | POA: Diagnosis not present

## 2016-03-04 DIAGNOSIS — E669 Obesity, unspecified: Secondary | ICD-10-CM | POA: Diagnosis not present

## 2016-03-04 DIAGNOSIS — G950 Syringomyelia and syringobulbia: Secondary | ICD-10-CM | POA: Diagnosis not present

## 2016-03-04 DIAGNOSIS — Z6831 Body mass index (BMI) 31.0-31.9, adult: Secondary | ICD-10-CM | POA: Diagnosis not present

## 2016-03-04 DIAGNOSIS — Z466 Encounter for fitting and adjustment of urinary device: Secondary | ICD-10-CM | POA: Diagnosis not present

## 2016-03-06 DIAGNOSIS — Z6831 Body mass index (BMI) 31.0-31.9, adult: Secondary | ICD-10-CM | POA: Diagnosis not present

## 2016-03-06 DIAGNOSIS — M549 Dorsalgia, unspecified: Secondary | ICD-10-CM | POA: Diagnosis not present

## 2016-03-06 DIAGNOSIS — Z466 Encounter for fitting and adjustment of urinary device: Secondary | ICD-10-CM | POA: Diagnosis not present

## 2016-03-06 DIAGNOSIS — E669 Obesity, unspecified: Secondary | ICD-10-CM | POA: Diagnosis not present

## 2016-03-06 DIAGNOSIS — L89324 Pressure ulcer of left buttock, stage 4: Secondary | ICD-10-CM | POA: Diagnosis not present

## 2016-03-06 DIAGNOSIS — G950 Syringomyelia and syringobulbia: Secondary | ICD-10-CM | POA: Diagnosis not present

## 2016-03-09 DIAGNOSIS — Z466 Encounter for fitting and adjustment of urinary device: Secondary | ICD-10-CM | POA: Diagnosis not present

## 2016-03-09 DIAGNOSIS — Z6831 Body mass index (BMI) 31.0-31.9, adult: Secondary | ICD-10-CM | POA: Diagnosis not present

## 2016-03-09 DIAGNOSIS — G950 Syringomyelia and syringobulbia: Secondary | ICD-10-CM | POA: Diagnosis not present

## 2016-03-09 DIAGNOSIS — L89324 Pressure ulcer of left buttock, stage 4: Secondary | ICD-10-CM | POA: Diagnosis not present

## 2016-03-09 DIAGNOSIS — E669 Obesity, unspecified: Secondary | ICD-10-CM | POA: Diagnosis not present

## 2016-03-09 DIAGNOSIS — M549 Dorsalgia, unspecified: Secondary | ICD-10-CM | POA: Diagnosis not present

## 2016-03-11 DIAGNOSIS — E669 Obesity, unspecified: Secondary | ICD-10-CM | POA: Diagnosis not present

## 2016-03-11 DIAGNOSIS — L89324 Pressure ulcer of left buttock, stage 4: Secondary | ICD-10-CM | POA: Diagnosis not present

## 2016-03-11 DIAGNOSIS — M549 Dorsalgia, unspecified: Secondary | ICD-10-CM | POA: Diagnosis not present

## 2016-03-11 DIAGNOSIS — G950 Syringomyelia and syringobulbia: Secondary | ICD-10-CM | POA: Diagnosis not present

## 2016-03-11 DIAGNOSIS — Z466 Encounter for fitting and adjustment of urinary device: Secondary | ICD-10-CM | POA: Diagnosis not present

## 2016-03-11 DIAGNOSIS — Z6831 Body mass index (BMI) 31.0-31.9, adult: Secondary | ICD-10-CM | POA: Diagnosis not present

## 2016-03-13 DIAGNOSIS — M549 Dorsalgia, unspecified: Secondary | ICD-10-CM | POA: Diagnosis not present

## 2016-03-13 DIAGNOSIS — L89324 Pressure ulcer of left buttock, stage 4: Secondary | ICD-10-CM | POA: Diagnosis not present

## 2016-03-13 DIAGNOSIS — Z6831 Body mass index (BMI) 31.0-31.9, adult: Secondary | ICD-10-CM | POA: Diagnosis not present

## 2016-03-13 DIAGNOSIS — G950 Syringomyelia and syringobulbia: Secondary | ICD-10-CM | POA: Diagnosis not present

## 2016-03-13 DIAGNOSIS — E669 Obesity, unspecified: Secondary | ICD-10-CM | POA: Diagnosis not present

## 2016-03-13 DIAGNOSIS — Z466 Encounter for fitting and adjustment of urinary device: Secondary | ICD-10-CM | POA: Diagnosis not present

## 2016-03-16 DIAGNOSIS — Z6831 Body mass index (BMI) 31.0-31.9, adult: Secondary | ICD-10-CM | POA: Diagnosis not present

## 2016-03-16 DIAGNOSIS — E669 Obesity, unspecified: Secondary | ICD-10-CM | POA: Diagnosis not present

## 2016-03-16 DIAGNOSIS — G950 Syringomyelia and syringobulbia: Secondary | ICD-10-CM | POA: Diagnosis not present

## 2016-03-16 DIAGNOSIS — Z466 Encounter for fitting and adjustment of urinary device: Secondary | ICD-10-CM | POA: Diagnosis not present

## 2016-03-16 DIAGNOSIS — L89324 Pressure ulcer of left buttock, stage 4: Secondary | ICD-10-CM | POA: Diagnosis not present

## 2016-03-16 DIAGNOSIS — M549 Dorsalgia, unspecified: Secondary | ICD-10-CM | POA: Diagnosis not present

## 2016-03-18 DIAGNOSIS — Z6831 Body mass index (BMI) 31.0-31.9, adult: Secondary | ICD-10-CM | POA: Diagnosis not present

## 2016-03-18 DIAGNOSIS — R279 Unspecified lack of coordination: Secondary | ICD-10-CM | POA: Diagnosis not present

## 2016-03-18 DIAGNOSIS — M549 Dorsalgia, unspecified: Secondary | ICD-10-CM | POA: Diagnosis not present

## 2016-03-18 DIAGNOSIS — L89324 Pressure ulcer of left buttock, stage 4: Secondary | ICD-10-CM | POA: Diagnosis not present

## 2016-03-18 DIAGNOSIS — E669 Obesity, unspecified: Secondary | ICD-10-CM | POA: Diagnosis not present

## 2016-03-18 DIAGNOSIS — Z466 Encounter for fitting and adjustment of urinary device: Secondary | ICD-10-CM | POA: Diagnosis not present

## 2016-03-18 DIAGNOSIS — Z79899 Other long term (current) drug therapy: Secondary | ICD-10-CM | POA: Diagnosis not present

## 2016-03-18 DIAGNOSIS — G894 Chronic pain syndrome: Secondary | ICD-10-CM | POA: Diagnosis not present

## 2016-03-18 DIAGNOSIS — G950 Syringomyelia and syringobulbia: Secondary | ICD-10-CM | POA: Diagnosis not present

## 2016-03-18 DIAGNOSIS — Z79891 Long term (current) use of opiate analgesic: Secondary | ICD-10-CM | POA: Diagnosis not present

## 2016-03-18 DIAGNOSIS — M545 Low back pain: Secondary | ICD-10-CM | POA: Diagnosis not present

## 2016-03-18 DIAGNOSIS — Z7401 Bed confinement status: Secondary | ICD-10-CM | POA: Diagnosis not present

## 2016-03-18 DIAGNOSIS — Z743 Need for continuous supervision: Secondary | ICD-10-CM | POA: Diagnosis not present

## 2016-03-18 DIAGNOSIS — M1288 Other specific arthropathies, not elsewhere classified, other specified site: Secondary | ICD-10-CM | POA: Diagnosis not present

## 2016-03-18 DIAGNOSIS — M546 Pain in thoracic spine: Secondary | ICD-10-CM | POA: Diagnosis not present

## 2016-03-20 DIAGNOSIS — Z6831 Body mass index (BMI) 31.0-31.9, adult: Secondary | ICD-10-CM | POA: Diagnosis not present

## 2016-03-20 DIAGNOSIS — Z466 Encounter for fitting and adjustment of urinary device: Secondary | ICD-10-CM | POA: Diagnosis not present

## 2016-03-20 DIAGNOSIS — G950 Syringomyelia and syringobulbia: Secondary | ICD-10-CM | POA: Diagnosis not present

## 2016-03-20 DIAGNOSIS — L89324 Pressure ulcer of left buttock, stage 4: Secondary | ICD-10-CM | POA: Diagnosis not present

## 2016-03-20 DIAGNOSIS — E669 Obesity, unspecified: Secondary | ICD-10-CM | POA: Diagnosis not present

## 2016-03-20 DIAGNOSIS — M549 Dorsalgia, unspecified: Secondary | ICD-10-CM | POA: Diagnosis not present

## 2016-03-23 DIAGNOSIS — G950 Syringomyelia and syringobulbia: Secondary | ICD-10-CM | POA: Diagnosis not present

## 2016-03-23 DIAGNOSIS — L89324 Pressure ulcer of left buttock, stage 4: Secondary | ICD-10-CM | POA: Diagnosis not present

## 2016-03-23 DIAGNOSIS — M549 Dorsalgia, unspecified: Secondary | ICD-10-CM | POA: Diagnosis not present

## 2016-03-23 DIAGNOSIS — Z6831 Body mass index (BMI) 31.0-31.9, adult: Secondary | ICD-10-CM | POA: Diagnosis not present

## 2016-03-23 DIAGNOSIS — E669 Obesity, unspecified: Secondary | ICD-10-CM | POA: Diagnosis not present

## 2016-03-23 DIAGNOSIS — Z466 Encounter for fitting and adjustment of urinary device: Secondary | ICD-10-CM | POA: Diagnosis not present

## 2016-03-25 DIAGNOSIS — Z466 Encounter for fitting and adjustment of urinary device: Secondary | ICD-10-CM | POA: Diagnosis not present

## 2016-03-25 DIAGNOSIS — M549 Dorsalgia, unspecified: Secondary | ICD-10-CM | POA: Diagnosis not present

## 2016-03-25 DIAGNOSIS — E669 Obesity, unspecified: Secondary | ICD-10-CM | POA: Diagnosis not present

## 2016-03-25 DIAGNOSIS — L89324 Pressure ulcer of left buttock, stage 4: Secondary | ICD-10-CM | POA: Diagnosis not present

## 2016-03-25 DIAGNOSIS — Z6831 Body mass index (BMI) 31.0-31.9, adult: Secondary | ICD-10-CM | POA: Diagnosis not present

## 2016-03-25 DIAGNOSIS — G950 Syringomyelia and syringobulbia: Secondary | ICD-10-CM | POA: Diagnosis not present

## 2016-03-27 DIAGNOSIS — M549 Dorsalgia, unspecified: Secondary | ICD-10-CM | POA: Diagnosis not present

## 2016-03-27 DIAGNOSIS — G950 Syringomyelia and syringobulbia: Secondary | ICD-10-CM | POA: Diagnosis not present

## 2016-03-27 DIAGNOSIS — Z6831 Body mass index (BMI) 31.0-31.9, adult: Secondary | ICD-10-CM | POA: Diagnosis not present

## 2016-03-27 DIAGNOSIS — E669 Obesity, unspecified: Secondary | ICD-10-CM | POA: Diagnosis not present

## 2016-03-27 DIAGNOSIS — L89324 Pressure ulcer of left buttock, stage 4: Secondary | ICD-10-CM | POA: Diagnosis not present

## 2016-03-27 DIAGNOSIS — Z466 Encounter for fitting and adjustment of urinary device: Secondary | ICD-10-CM | POA: Diagnosis not present

## 2016-03-29 DIAGNOSIS — E669 Obesity, unspecified: Secondary | ICD-10-CM | POA: Diagnosis not present

## 2016-03-29 DIAGNOSIS — L89324 Pressure ulcer of left buttock, stage 4: Secondary | ICD-10-CM | POA: Diagnosis not present

## 2016-03-29 DIAGNOSIS — Z466 Encounter for fitting and adjustment of urinary device: Secondary | ICD-10-CM | POA: Diagnosis not present

## 2016-03-29 DIAGNOSIS — G950 Syringomyelia and syringobulbia: Secondary | ICD-10-CM | POA: Diagnosis not present

## 2016-03-29 DIAGNOSIS — Z6831 Body mass index (BMI) 31.0-31.9, adult: Secondary | ICD-10-CM | POA: Diagnosis not present

## 2016-03-29 DIAGNOSIS — M549 Dorsalgia, unspecified: Secondary | ICD-10-CM | POA: Diagnosis not present

## 2016-03-30 DIAGNOSIS — L89324 Pressure ulcer of left buttock, stage 4: Secondary | ICD-10-CM | POA: Diagnosis not present

## 2016-03-30 DIAGNOSIS — Z466 Encounter for fitting and adjustment of urinary device: Secondary | ICD-10-CM | POA: Diagnosis not present

## 2016-03-30 DIAGNOSIS — G950 Syringomyelia and syringobulbia: Secondary | ICD-10-CM | POA: Diagnosis not present

## 2016-03-30 DIAGNOSIS — Z6831 Body mass index (BMI) 31.0-31.9, adult: Secondary | ICD-10-CM | POA: Diagnosis not present

## 2016-03-30 DIAGNOSIS — M549 Dorsalgia, unspecified: Secondary | ICD-10-CM | POA: Diagnosis not present

## 2016-03-30 DIAGNOSIS — E669 Obesity, unspecified: Secondary | ICD-10-CM | POA: Diagnosis not present

## 2016-04-01 DIAGNOSIS — L89324 Pressure ulcer of left buttock, stage 4: Secondary | ICD-10-CM | POA: Diagnosis not present

## 2016-04-01 DIAGNOSIS — E669 Obesity, unspecified: Secondary | ICD-10-CM | POA: Diagnosis not present

## 2016-04-01 DIAGNOSIS — M549 Dorsalgia, unspecified: Secondary | ICD-10-CM | POA: Diagnosis not present

## 2016-04-01 DIAGNOSIS — Z466 Encounter for fitting and adjustment of urinary device: Secondary | ICD-10-CM | POA: Diagnosis not present

## 2016-04-01 DIAGNOSIS — G950 Syringomyelia and syringobulbia: Secondary | ICD-10-CM | POA: Diagnosis not present

## 2016-04-01 DIAGNOSIS — Z6831 Body mass index (BMI) 31.0-31.9, adult: Secondary | ICD-10-CM | POA: Diagnosis not present

## 2016-04-03 DIAGNOSIS — G950 Syringomyelia and syringobulbia: Secondary | ICD-10-CM | POA: Diagnosis not present

## 2016-04-03 DIAGNOSIS — Z6831 Body mass index (BMI) 31.0-31.9, adult: Secondary | ICD-10-CM | POA: Diagnosis not present

## 2016-04-03 DIAGNOSIS — Z466 Encounter for fitting and adjustment of urinary device: Secondary | ICD-10-CM | POA: Diagnosis not present

## 2016-04-03 DIAGNOSIS — E669 Obesity, unspecified: Secondary | ICD-10-CM | POA: Diagnosis not present

## 2016-04-03 DIAGNOSIS — M549 Dorsalgia, unspecified: Secondary | ICD-10-CM | POA: Diagnosis not present

## 2016-04-03 DIAGNOSIS — L89324 Pressure ulcer of left buttock, stage 4: Secondary | ICD-10-CM | POA: Diagnosis not present

## 2016-04-05 DIAGNOSIS — N390 Urinary tract infection, site not specified: Secondary | ICD-10-CM | POA: Diagnosis not present

## 2016-04-05 DIAGNOSIS — G950 Syringomyelia and syringobulbia: Secondary | ICD-10-CM | POA: Diagnosis not present

## 2016-04-05 DIAGNOSIS — L89324 Pressure ulcer of left buttock, stage 4: Secondary | ICD-10-CM | POA: Diagnosis not present

## 2016-04-05 DIAGNOSIS — Z466 Encounter for fitting and adjustment of urinary device: Secondary | ICD-10-CM | POA: Diagnosis not present

## 2016-04-05 DIAGNOSIS — M549 Dorsalgia, unspecified: Secondary | ICD-10-CM | POA: Diagnosis not present

## 2016-04-05 DIAGNOSIS — Z6831 Body mass index (BMI) 31.0-31.9, adult: Secondary | ICD-10-CM | POA: Diagnosis not present

## 2016-04-05 DIAGNOSIS — Z4800 Encounter for change or removal of nonsurgical wound dressing: Secondary | ICD-10-CM | POA: Diagnosis not present

## 2016-04-05 DIAGNOSIS — E669 Obesity, unspecified: Secondary | ICD-10-CM | POA: Diagnosis not present

## 2016-04-06 DIAGNOSIS — Z466 Encounter for fitting and adjustment of urinary device: Secondary | ICD-10-CM | POA: Diagnosis not present

## 2016-04-06 DIAGNOSIS — N390 Urinary tract infection, site not specified: Secondary | ICD-10-CM | POA: Diagnosis not present

## 2016-04-06 DIAGNOSIS — M549 Dorsalgia, unspecified: Secondary | ICD-10-CM | POA: Diagnosis not present

## 2016-04-06 DIAGNOSIS — E669 Obesity, unspecified: Secondary | ICD-10-CM | POA: Diagnosis not present

## 2016-04-06 DIAGNOSIS — L89324 Pressure ulcer of left buttock, stage 4: Secondary | ICD-10-CM | POA: Diagnosis not present

## 2016-04-06 DIAGNOSIS — G950 Syringomyelia and syringobulbia: Secondary | ICD-10-CM | POA: Diagnosis not present

## 2016-04-08 DIAGNOSIS — N390 Urinary tract infection, site not specified: Secondary | ICD-10-CM | POA: Diagnosis not present

## 2016-04-08 DIAGNOSIS — E669 Obesity, unspecified: Secondary | ICD-10-CM | POA: Diagnosis not present

## 2016-04-08 DIAGNOSIS — L89324 Pressure ulcer of left buttock, stage 4: Secondary | ICD-10-CM | POA: Diagnosis not present

## 2016-04-08 DIAGNOSIS — G950 Syringomyelia and syringobulbia: Secondary | ICD-10-CM | POA: Diagnosis not present

## 2016-04-08 DIAGNOSIS — Z466 Encounter for fitting and adjustment of urinary device: Secondary | ICD-10-CM | POA: Diagnosis not present

## 2016-04-08 DIAGNOSIS — M549 Dorsalgia, unspecified: Secondary | ICD-10-CM | POA: Diagnosis not present

## 2016-04-09 DIAGNOSIS — E669 Obesity, unspecified: Secondary | ICD-10-CM | POA: Diagnosis not present

## 2016-04-09 DIAGNOSIS — L89324 Pressure ulcer of left buttock, stage 4: Secondary | ICD-10-CM | POA: Diagnosis not present

## 2016-04-09 DIAGNOSIS — Z466 Encounter for fitting and adjustment of urinary device: Secondary | ICD-10-CM | POA: Diagnosis not present

## 2016-04-09 DIAGNOSIS — G950 Syringomyelia and syringobulbia: Secondary | ICD-10-CM | POA: Diagnosis not present

## 2016-04-09 DIAGNOSIS — N390 Urinary tract infection, site not specified: Secondary | ICD-10-CM | POA: Diagnosis not present

## 2016-04-09 DIAGNOSIS — M549 Dorsalgia, unspecified: Secondary | ICD-10-CM | POA: Diagnosis not present

## 2016-04-10 DIAGNOSIS — E669 Obesity, unspecified: Secondary | ICD-10-CM | POA: Diagnosis not present

## 2016-04-10 DIAGNOSIS — Z466 Encounter for fitting and adjustment of urinary device: Secondary | ICD-10-CM | POA: Diagnosis not present

## 2016-04-10 DIAGNOSIS — L89324 Pressure ulcer of left buttock, stage 4: Secondary | ICD-10-CM | POA: Diagnosis not present

## 2016-04-10 DIAGNOSIS — M549 Dorsalgia, unspecified: Secondary | ICD-10-CM | POA: Diagnosis not present

## 2016-04-10 DIAGNOSIS — G950 Syringomyelia and syringobulbia: Secondary | ICD-10-CM | POA: Diagnosis not present

## 2016-04-10 DIAGNOSIS — N390 Urinary tract infection, site not specified: Secondary | ICD-10-CM | POA: Diagnosis not present

## 2016-04-13 DIAGNOSIS — M549 Dorsalgia, unspecified: Secondary | ICD-10-CM | POA: Diagnosis not present

## 2016-04-13 DIAGNOSIS — E669 Obesity, unspecified: Secondary | ICD-10-CM | POA: Diagnosis not present

## 2016-04-13 DIAGNOSIS — N390 Urinary tract infection, site not specified: Secondary | ICD-10-CM | POA: Diagnosis not present

## 2016-04-13 DIAGNOSIS — Z466 Encounter for fitting and adjustment of urinary device: Secondary | ICD-10-CM | POA: Diagnosis not present

## 2016-04-13 DIAGNOSIS — G950 Syringomyelia and syringobulbia: Secondary | ICD-10-CM | POA: Diagnosis not present

## 2016-04-13 DIAGNOSIS — L89324 Pressure ulcer of left buttock, stage 4: Secondary | ICD-10-CM | POA: Diagnosis not present

## 2016-04-15 DIAGNOSIS — Z466 Encounter for fitting and adjustment of urinary device: Secondary | ICD-10-CM | POA: Diagnosis not present

## 2016-04-15 DIAGNOSIS — L89324 Pressure ulcer of left buttock, stage 4: Secondary | ICD-10-CM | POA: Diagnosis not present

## 2016-04-15 DIAGNOSIS — N390 Urinary tract infection, site not specified: Secondary | ICD-10-CM | POA: Diagnosis not present

## 2016-04-15 DIAGNOSIS — E669 Obesity, unspecified: Secondary | ICD-10-CM | POA: Diagnosis not present

## 2016-04-15 DIAGNOSIS — M549 Dorsalgia, unspecified: Secondary | ICD-10-CM | POA: Diagnosis not present

## 2016-04-15 DIAGNOSIS — G950 Syringomyelia and syringobulbia: Secondary | ICD-10-CM | POA: Diagnosis not present

## 2016-04-20 DIAGNOSIS — Z466 Encounter for fitting and adjustment of urinary device: Secondary | ICD-10-CM | POA: Diagnosis not present

## 2016-04-20 DIAGNOSIS — G950 Syringomyelia and syringobulbia: Secondary | ICD-10-CM | POA: Diagnosis not present

## 2016-04-20 DIAGNOSIS — L89324 Pressure ulcer of left buttock, stage 4: Secondary | ICD-10-CM | POA: Diagnosis not present

## 2016-04-20 DIAGNOSIS — M549 Dorsalgia, unspecified: Secondary | ICD-10-CM | POA: Diagnosis not present

## 2016-04-20 DIAGNOSIS — N390 Urinary tract infection, site not specified: Secondary | ICD-10-CM | POA: Diagnosis not present

## 2016-04-20 DIAGNOSIS — E669 Obesity, unspecified: Secondary | ICD-10-CM | POA: Diagnosis not present

## 2016-04-22 DIAGNOSIS — G950 Syringomyelia and syringobulbia: Secondary | ICD-10-CM | POA: Diagnosis not present

## 2016-04-22 DIAGNOSIS — L89324 Pressure ulcer of left buttock, stage 4: Secondary | ICD-10-CM | POA: Diagnosis not present

## 2016-04-22 DIAGNOSIS — Z466 Encounter for fitting and adjustment of urinary device: Secondary | ICD-10-CM | POA: Diagnosis not present

## 2016-04-22 DIAGNOSIS — N390 Urinary tract infection, site not specified: Secondary | ICD-10-CM | POA: Diagnosis not present

## 2016-04-22 DIAGNOSIS — M549 Dorsalgia, unspecified: Secondary | ICD-10-CM | POA: Diagnosis not present

## 2016-04-22 DIAGNOSIS — E669 Obesity, unspecified: Secondary | ICD-10-CM | POA: Diagnosis not present

## 2016-04-24 DIAGNOSIS — G950 Syringomyelia and syringobulbia: Secondary | ICD-10-CM | POA: Diagnosis not present

## 2016-04-24 DIAGNOSIS — Z466 Encounter for fitting and adjustment of urinary device: Secondary | ICD-10-CM | POA: Diagnosis not present

## 2016-04-24 DIAGNOSIS — M549 Dorsalgia, unspecified: Secondary | ICD-10-CM | POA: Diagnosis not present

## 2016-04-24 DIAGNOSIS — E669 Obesity, unspecified: Secondary | ICD-10-CM | POA: Diagnosis not present

## 2016-04-24 DIAGNOSIS — L89324 Pressure ulcer of left buttock, stage 4: Secondary | ICD-10-CM | POA: Diagnosis not present

## 2016-04-24 DIAGNOSIS — N390 Urinary tract infection, site not specified: Secondary | ICD-10-CM | POA: Diagnosis not present

## 2016-04-27 DIAGNOSIS — Z466 Encounter for fitting and adjustment of urinary device: Secondary | ICD-10-CM | POA: Diagnosis not present

## 2016-04-27 DIAGNOSIS — L89324 Pressure ulcer of left buttock, stage 4: Secondary | ICD-10-CM | POA: Diagnosis not present

## 2016-04-27 DIAGNOSIS — E669 Obesity, unspecified: Secondary | ICD-10-CM | POA: Diagnosis not present

## 2016-04-27 DIAGNOSIS — N390 Urinary tract infection, site not specified: Secondary | ICD-10-CM | POA: Diagnosis not present

## 2016-04-27 DIAGNOSIS — M549 Dorsalgia, unspecified: Secondary | ICD-10-CM | POA: Diagnosis not present

## 2016-04-27 DIAGNOSIS — G950 Syringomyelia and syringobulbia: Secondary | ICD-10-CM | POA: Diagnosis not present

## 2016-05-02 DIAGNOSIS — M549 Dorsalgia, unspecified: Secondary | ICD-10-CM | POA: Diagnosis not present

## 2016-05-02 DIAGNOSIS — Z466 Encounter for fitting and adjustment of urinary device: Secondary | ICD-10-CM | POA: Diagnosis not present

## 2016-05-02 DIAGNOSIS — N390 Urinary tract infection, site not specified: Secondary | ICD-10-CM | POA: Diagnosis not present

## 2016-05-02 DIAGNOSIS — L89324 Pressure ulcer of left buttock, stage 4: Secondary | ICD-10-CM | POA: Diagnosis not present

## 2016-05-02 DIAGNOSIS — E669 Obesity, unspecified: Secondary | ICD-10-CM | POA: Diagnosis not present

## 2016-05-02 DIAGNOSIS — G950 Syringomyelia and syringobulbia: Secondary | ICD-10-CM | POA: Diagnosis not present

## 2016-05-04 DIAGNOSIS — N390 Urinary tract infection, site not specified: Secondary | ICD-10-CM | POA: Diagnosis not present

## 2016-05-04 DIAGNOSIS — M549 Dorsalgia, unspecified: Secondary | ICD-10-CM | POA: Diagnosis not present

## 2016-05-04 DIAGNOSIS — E669 Obesity, unspecified: Secondary | ICD-10-CM | POA: Diagnosis not present

## 2016-05-04 DIAGNOSIS — L89324 Pressure ulcer of left buttock, stage 4: Secondary | ICD-10-CM | POA: Diagnosis not present

## 2016-05-04 DIAGNOSIS — G950 Syringomyelia and syringobulbia: Secondary | ICD-10-CM | POA: Diagnosis not present

## 2016-05-04 DIAGNOSIS — Z466 Encounter for fitting and adjustment of urinary device: Secondary | ICD-10-CM | POA: Diagnosis not present

## 2016-05-06 DIAGNOSIS — N390 Urinary tract infection, site not specified: Secondary | ICD-10-CM | POA: Diagnosis not present

## 2016-05-06 DIAGNOSIS — M549 Dorsalgia, unspecified: Secondary | ICD-10-CM | POA: Diagnosis not present

## 2016-05-06 DIAGNOSIS — G950 Syringomyelia and syringobulbia: Secondary | ICD-10-CM | POA: Diagnosis not present

## 2016-05-06 DIAGNOSIS — Z466 Encounter for fitting and adjustment of urinary device: Secondary | ICD-10-CM | POA: Diagnosis not present

## 2016-05-06 DIAGNOSIS — L89324 Pressure ulcer of left buttock, stage 4: Secondary | ICD-10-CM | POA: Diagnosis not present

## 2016-05-06 DIAGNOSIS — E669 Obesity, unspecified: Secondary | ICD-10-CM | POA: Diagnosis not present

## 2016-05-08 DIAGNOSIS — E669 Obesity, unspecified: Secondary | ICD-10-CM | POA: Diagnosis not present

## 2016-05-08 DIAGNOSIS — M549 Dorsalgia, unspecified: Secondary | ICD-10-CM | POA: Diagnosis not present

## 2016-05-08 DIAGNOSIS — Z466 Encounter for fitting and adjustment of urinary device: Secondary | ICD-10-CM | POA: Diagnosis not present

## 2016-05-08 DIAGNOSIS — G950 Syringomyelia and syringobulbia: Secondary | ICD-10-CM | POA: Diagnosis not present

## 2016-05-08 DIAGNOSIS — L89324 Pressure ulcer of left buttock, stage 4: Secondary | ICD-10-CM | POA: Diagnosis not present

## 2016-05-08 DIAGNOSIS — N390 Urinary tract infection, site not specified: Secondary | ICD-10-CM | POA: Diagnosis not present

## 2016-05-11 DIAGNOSIS — L89324 Pressure ulcer of left buttock, stage 4: Secondary | ICD-10-CM | POA: Diagnosis not present

## 2016-05-11 DIAGNOSIS — Z466 Encounter for fitting and adjustment of urinary device: Secondary | ICD-10-CM | POA: Diagnosis not present

## 2016-05-11 DIAGNOSIS — N390 Urinary tract infection, site not specified: Secondary | ICD-10-CM | POA: Diagnosis not present

## 2016-05-11 DIAGNOSIS — G950 Syringomyelia and syringobulbia: Secondary | ICD-10-CM | POA: Diagnosis not present

## 2016-05-11 DIAGNOSIS — E669 Obesity, unspecified: Secondary | ICD-10-CM | POA: Diagnosis not present

## 2016-05-11 DIAGNOSIS — M549 Dorsalgia, unspecified: Secondary | ICD-10-CM | POA: Diagnosis not present

## 2016-05-13 DIAGNOSIS — L89324 Pressure ulcer of left buttock, stage 4: Secondary | ICD-10-CM | POA: Diagnosis not present

## 2016-05-13 DIAGNOSIS — G950 Syringomyelia and syringobulbia: Secondary | ICD-10-CM | POA: Diagnosis not present

## 2016-05-13 DIAGNOSIS — M549 Dorsalgia, unspecified: Secondary | ICD-10-CM | POA: Diagnosis not present

## 2016-05-13 DIAGNOSIS — E669 Obesity, unspecified: Secondary | ICD-10-CM | POA: Diagnosis not present

## 2016-05-13 DIAGNOSIS — Z466 Encounter for fitting and adjustment of urinary device: Secondary | ICD-10-CM | POA: Diagnosis not present

## 2016-05-13 DIAGNOSIS — N390 Urinary tract infection, site not specified: Secondary | ICD-10-CM | POA: Diagnosis not present

## 2016-05-15 DIAGNOSIS — N390 Urinary tract infection, site not specified: Secondary | ICD-10-CM | POA: Diagnosis not present

## 2016-05-15 DIAGNOSIS — L89324 Pressure ulcer of left buttock, stage 4: Secondary | ICD-10-CM | POA: Diagnosis not present

## 2016-05-15 DIAGNOSIS — Z466 Encounter for fitting and adjustment of urinary device: Secondary | ICD-10-CM | POA: Diagnosis not present

## 2016-05-15 DIAGNOSIS — G950 Syringomyelia and syringobulbia: Secondary | ICD-10-CM | POA: Diagnosis not present

## 2016-05-15 DIAGNOSIS — E669 Obesity, unspecified: Secondary | ICD-10-CM | POA: Diagnosis not present

## 2016-05-15 DIAGNOSIS — M549 Dorsalgia, unspecified: Secondary | ICD-10-CM | POA: Diagnosis not present

## 2016-05-18 DIAGNOSIS — N390 Urinary tract infection, site not specified: Secondary | ICD-10-CM | POA: Diagnosis not present

## 2016-05-18 DIAGNOSIS — L89324 Pressure ulcer of left buttock, stage 4: Secondary | ICD-10-CM | POA: Diagnosis not present

## 2016-05-18 DIAGNOSIS — M549 Dorsalgia, unspecified: Secondary | ICD-10-CM | POA: Diagnosis not present

## 2016-05-18 DIAGNOSIS — E669 Obesity, unspecified: Secondary | ICD-10-CM | POA: Diagnosis not present

## 2016-05-18 DIAGNOSIS — Z466 Encounter for fitting and adjustment of urinary device: Secondary | ICD-10-CM | POA: Diagnosis not present

## 2016-05-18 DIAGNOSIS — G950 Syringomyelia and syringobulbia: Secondary | ICD-10-CM | POA: Diagnosis not present

## 2016-05-20 DIAGNOSIS — M549 Dorsalgia, unspecified: Secondary | ICD-10-CM | POA: Diagnosis not present

## 2016-05-20 DIAGNOSIS — Z466 Encounter for fitting and adjustment of urinary device: Secondary | ICD-10-CM | POA: Diagnosis not present

## 2016-05-20 DIAGNOSIS — G950 Syringomyelia and syringobulbia: Secondary | ICD-10-CM | POA: Diagnosis not present

## 2016-05-20 DIAGNOSIS — E669 Obesity, unspecified: Secondary | ICD-10-CM | POA: Diagnosis not present

## 2016-05-20 DIAGNOSIS — L89324 Pressure ulcer of left buttock, stage 4: Secondary | ICD-10-CM | POA: Diagnosis not present

## 2016-05-20 DIAGNOSIS — N390 Urinary tract infection, site not specified: Secondary | ICD-10-CM | POA: Diagnosis not present

## 2016-05-22 DIAGNOSIS — M549 Dorsalgia, unspecified: Secondary | ICD-10-CM | POA: Diagnosis not present

## 2016-05-22 DIAGNOSIS — Z466 Encounter for fitting and adjustment of urinary device: Secondary | ICD-10-CM | POA: Diagnosis not present

## 2016-05-22 DIAGNOSIS — E669 Obesity, unspecified: Secondary | ICD-10-CM | POA: Diagnosis not present

## 2016-05-22 DIAGNOSIS — L89324 Pressure ulcer of left buttock, stage 4: Secondary | ICD-10-CM | POA: Diagnosis not present

## 2016-05-22 DIAGNOSIS — N390 Urinary tract infection, site not specified: Secondary | ICD-10-CM | POA: Diagnosis not present

## 2016-05-22 DIAGNOSIS — G950 Syringomyelia and syringobulbia: Secondary | ICD-10-CM | POA: Diagnosis not present

## 2016-05-25 DIAGNOSIS — G950 Syringomyelia and syringobulbia: Secondary | ICD-10-CM | POA: Diagnosis not present

## 2016-05-25 DIAGNOSIS — L89324 Pressure ulcer of left buttock, stage 4: Secondary | ICD-10-CM | POA: Diagnosis not present

## 2016-05-25 DIAGNOSIS — N390 Urinary tract infection, site not specified: Secondary | ICD-10-CM | POA: Diagnosis not present

## 2016-05-25 DIAGNOSIS — M549 Dorsalgia, unspecified: Secondary | ICD-10-CM | POA: Diagnosis not present

## 2016-05-25 DIAGNOSIS — Z466 Encounter for fitting and adjustment of urinary device: Secondary | ICD-10-CM | POA: Diagnosis not present

## 2016-05-25 DIAGNOSIS — E669 Obesity, unspecified: Secondary | ICD-10-CM | POA: Diagnosis not present

## 2016-05-29 DIAGNOSIS — E669 Obesity, unspecified: Secondary | ICD-10-CM | POA: Diagnosis not present

## 2016-05-29 DIAGNOSIS — N390 Urinary tract infection, site not specified: Secondary | ICD-10-CM | POA: Diagnosis not present

## 2016-05-29 DIAGNOSIS — Z466 Encounter for fitting and adjustment of urinary device: Secondary | ICD-10-CM | POA: Diagnosis not present

## 2016-05-29 DIAGNOSIS — L89324 Pressure ulcer of left buttock, stage 4: Secondary | ICD-10-CM | POA: Diagnosis not present

## 2016-05-29 DIAGNOSIS — G950 Syringomyelia and syringobulbia: Secondary | ICD-10-CM | POA: Diagnosis not present

## 2016-05-29 DIAGNOSIS — M549 Dorsalgia, unspecified: Secondary | ICD-10-CM | POA: Diagnosis not present

## 2016-06-06 DIAGNOSIS — Z91040 Latex allergy status: Secondary | ICD-10-CM | POA: Diagnosis not present

## 2016-06-06 DIAGNOSIS — S299XXA Unspecified injury of thorax, initial encounter: Secondary | ICD-10-CM | POA: Diagnosis not present

## 2016-06-06 DIAGNOSIS — M545 Low back pain: Secondary | ICD-10-CM | POA: Diagnosis not present

## 2016-06-06 DIAGNOSIS — R000 Tachycardia, unspecified: Secondary | ICD-10-CM | POA: Diagnosis not present

## 2016-06-06 DIAGNOSIS — L89622 Pressure ulcer of left heel, stage 2: Secondary | ICD-10-CM | POA: Diagnosis not present

## 2016-06-06 DIAGNOSIS — M25551 Pain in right hip: Secondary | ICD-10-CM | POA: Diagnosis not present

## 2016-06-06 DIAGNOSIS — S79911A Unspecified injury of right hip, initial encounter: Secondary | ICD-10-CM | POA: Diagnosis not present

## 2016-06-06 DIAGNOSIS — Z765 Malingerer [conscious simulation]: Secondary | ICD-10-CM | POA: Diagnosis not present

## 2016-06-06 DIAGNOSIS — S3992XA Unspecified injury of lower back, initial encounter: Secondary | ICD-10-CM | POA: Diagnosis not present

## 2016-06-06 DIAGNOSIS — M546 Pain in thoracic spine: Secondary | ICD-10-CM | POA: Diagnosis not present

## 2016-06-06 DIAGNOSIS — M5489 Other dorsalgia: Secondary | ICD-10-CM | POA: Diagnosis not present

## 2016-06-06 DIAGNOSIS — G8929 Other chronic pain: Secondary | ICD-10-CM | POA: Diagnosis not present

## 2016-06-06 DIAGNOSIS — W06XXXA Fall from bed, initial encounter: Secondary | ICD-10-CM | POA: Diagnosis not present

## 2016-06-07 DIAGNOSIS — Z7401 Bed confinement status: Secondary | ICD-10-CM | POA: Diagnosis not present

## 2016-06-07 DIAGNOSIS — Z765 Malingerer [conscious simulation]: Secondary | ICD-10-CM | POA: Diagnosis not present

## 2016-06-07 DIAGNOSIS — W06XXXA Fall from bed, initial encounter: Secondary | ICD-10-CM | POA: Diagnosis not present

## 2016-06-07 DIAGNOSIS — M25551 Pain in right hip: Secondary | ICD-10-CM | POA: Diagnosis present

## 2016-06-07 DIAGNOSIS — R209 Unspecified disturbances of skin sensation: Secondary | ICD-10-CM | POA: Diagnosis present

## 2016-06-07 DIAGNOSIS — F419 Anxiety disorder, unspecified: Secondary | ICD-10-CM | POA: Diagnosis present

## 2016-06-07 DIAGNOSIS — Z79891 Long term (current) use of opiate analgesic: Secondary | ICD-10-CM | POA: Diagnosis not present

## 2016-06-07 DIAGNOSIS — L89619 Pressure ulcer of right heel, unspecified stage: Secondary | ICD-10-CM | POA: Diagnosis present

## 2016-06-07 DIAGNOSIS — Z79899 Other long term (current) drug therapy: Secondary | ICD-10-CM | POA: Diagnosis not present

## 2016-06-07 DIAGNOSIS — Z91040 Latex allergy status: Secondary | ICD-10-CM | POA: Diagnosis not present

## 2016-06-07 DIAGNOSIS — G8929 Other chronic pain: Secondary | ICD-10-CM | POA: Diagnosis not present

## 2016-06-07 DIAGNOSIS — L89622 Pressure ulcer of left heel, stage 2: Secondary | ICD-10-CM | POA: Diagnosis present

## 2016-06-07 DIAGNOSIS — M5489 Other dorsalgia: Secondary | ICD-10-CM | POA: Diagnosis not present

## 2016-06-07 DIAGNOSIS — G629 Polyneuropathy, unspecified: Secondary | ICD-10-CM | POA: Diagnosis present

## 2016-06-07 DIAGNOSIS — R279 Unspecified lack of coordination: Secondary | ICD-10-CM | POA: Diagnosis not present

## 2016-06-07 DIAGNOSIS — F329 Major depressive disorder, single episode, unspecified: Secondary | ICD-10-CM | POA: Diagnosis present

## 2016-06-08 DIAGNOSIS — R202 Paresthesia of skin: Secondary | ICD-10-CM | POA: Diagnosis not present

## 2016-06-08 DIAGNOSIS — E669 Obesity, unspecified: Secondary | ICD-10-CM | POA: Diagnosis not present

## 2016-06-08 DIAGNOSIS — G8929 Other chronic pain: Secondary | ICD-10-CM | POA: Diagnosis not present

## 2016-06-08 DIAGNOSIS — Z87440 Personal history of urinary (tract) infections: Secondary | ICD-10-CM | POA: Diagnosis not present

## 2016-06-08 DIAGNOSIS — G950 Syringomyelia and syringobulbia: Secondary | ICD-10-CM | POA: Diagnosis not present

## 2016-06-08 DIAGNOSIS — F329 Major depressive disorder, single episode, unspecified: Secondary | ICD-10-CM | POA: Diagnosis not present

## 2016-06-08 DIAGNOSIS — M549 Dorsalgia, unspecified: Secondary | ICD-10-CM | POA: Diagnosis not present

## 2016-06-08 DIAGNOSIS — G608 Other hereditary and idiopathic neuropathies: Secondary | ICD-10-CM | POA: Diagnosis not present

## 2016-06-08 DIAGNOSIS — F419 Anxiety disorder, unspecified: Secondary | ICD-10-CM | POA: Diagnosis not present

## 2016-06-08 DIAGNOSIS — L89324 Pressure ulcer of left buttock, stage 4: Secondary | ICD-10-CM | POA: Diagnosis not present

## 2016-06-08 DIAGNOSIS — G609 Hereditary and idiopathic neuropathy, unspecified: Secondary | ICD-10-CM | POA: Diagnosis not present

## 2016-06-08 DIAGNOSIS — Z466 Encounter for fitting and adjustment of urinary device: Secondary | ICD-10-CM | POA: Diagnosis not present

## 2016-06-09 DIAGNOSIS — G894 Chronic pain syndrome: Secondary | ICD-10-CM | POA: Diagnosis not present

## 2016-06-09 DIAGNOSIS — Z79891 Long term (current) use of opiate analgesic: Secondary | ICD-10-CM | POA: Diagnosis not present

## 2016-06-09 DIAGNOSIS — Z79899 Other long term (current) drug therapy: Secondary | ICD-10-CM | POA: Diagnosis not present

## 2016-06-09 DIAGNOSIS — M79606 Pain in leg, unspecified: Secondary | ICD-10-CM | POA: Diagnosis not present

## 2016-06-09 DIAGNOSIS — M1288 Other specific arthropathies, not elsewhere classified, other specified site: Secondary | ICD-10-CM | POA: Diagnosis not present

## 2016-06-09 DIAGNOSIS — M546 Pain in thoracic spine: Secondary | ICD-10-CM | POA: Diagnosis not present

## 2016-06-10 DIAGNOSIS — G609 Hereditary and idiopathic neuropathy, unspecified: Secondary | ICD-10-CM | POA: Diagnosis not present

## 2016-06-10 DIAGNOSIS — Z466 Encounter for fitting and adjustment of urinary device: Secondary | ICD-10-CM | POA: Diagnosis not present

## 2016-06-10 DIAGNOSIS — G8929 Other chronic pain: Secondary | ICD-10-CM | POA: Diagnosis not present

## 2016-06-10 DIAGNOSIS — G950 Syringomyelia and syringobulbia: Secondary | ICD-10-CM | POA: Diagnosis not present

## 2016-06-10 DIAGNOSIS — L89324 Pressure ulcer of left buttock, stage 4: Secondary | ICD-10-CM | POA: Diagnosis not present

## 2016-06-10 DIAGNOSIS — M549 Dorsalgia, unspecified: Secondary | ICD-10-CM | POA: Diagnosis not present

## 2016-06-11 DIAGNOSIS — R279 Unspecified lack of coordination: Secondary | ICD-10-CM | POA: Diagnosis not present

## 2016-06-11 DIAGNOSIS — G8222 Paraplegia, incomplete: Secondary | ICD-10-CM | POA: Diagnosis not present

## 2016-06-11 DIAGNOSIS — L89324 Pressure ulcer of left buttock, stage 4: Secondary | ICD-10-CM | POA: Diagnosis not present

## 2016-06-11 DIAGNOSIS — G8929 Other chronic pain: Secondary | ICD-10-CM | POA: Diagnosis not present

## 2016-06-11 DIAGNOSIS — Z466 Encounter for fitting and adjustment of urinary device: Secondary | ICD-10-CM | POA: Diagnosis not present

## 2016-06-11 DIAGNOSIS — M549 Dorsalgia, unspecified: Secondary | ICD-10-CM | POA: Diagnosis not present

## 2016-06-11 DIAGNOSIS — F320 Major depressive disorder, single episode, mild: Secondary | ICD-10-CM | POA: Diagnosis not present

## 2016-06-11 DIAGNOSIS — Z7401 Bed confinement status: Secondary | ICD-10-CM | POA: Diagnosis not present

## 2016-06-11 DIAGNOSIS — F900 Attention-deficit hyperactivity disorder, predominantly inattentive type: Secondary | ICD-10-CM | POA: Diagnosis not present

## 2016-06-11 DIAGNOSIS — G609 Hereditary and idiopathic neuropathy, unspecified: Secondary | ICD-10-CM | POA: Diagnosis not present

## 2016-06-11 DIAGNOSIS — Z743 Need for continuous supervision: Secondary | ICD-10-CM | POA: Diagnosis not present

## 2016-06-11 DIAGNOSIS — K210 Gastro-esophageal reflux disease with esophagitis: Secondary | ICD-10-CM | POA: Diagnosis not present

## 2016-06-11 DIAGNOSIS — F418 Other specified anxiety disorders: Secondary | ICD-10-CM | POA: Diagnosis not present

## 2016-06-11 DIAGNOSIS — G950 Syringomyelia and syringobulbia: Secondary | ICD-10-CM | POA: Diagnosis not present

## 2016-06-12 DIAGNOSIS — L89324 Pressure ulcer of left buttock, stage 4: Secondary | ICD-10-CM | POA: Diagnosis not present

## 2016-06-12 DIAGNOSIS — G8929 Other chronic pain: Secondary | ICD-10-CM | POA: Diagnosis not present

## 2016-06-12 DIAGNOSIS — G609 Hereditary and idiopathic neuropathy, unspecified: Secondary | ICD-10-CM | POA: Diagnosis not present

## 2016-06-12 DIAGNOSIS — Z466 Encounter for fitting and adjustment of urinary device: Secondary | ICD-10-CM | POA: Diagnosis not present

## 2016-06-12 DIAGNOSIS — G950 Syringomyelia and syringobulbia: Secondary | ICD-10-CM | POA: Diagnosis not present

## 2016-06-12 DIAGNOSIS — M549 Dorsalgia, unspecified: Secondary | ICD-10-CM | POA: Diagnosis not present

## 2016-06-15 DIAGNOSIS — G950 Syringomyelia and syringobulbia: Secondary | ICD-10-CM | POA: Diagnosis not present

## 2016-06-15 DIAGNOSIS — L89324 Pressure ulcer of left buttock, stage 4: Secondary | ICD-10-CM | POA: Diagnosis not present

## 2016-06-15 DIAGNOSIS — G609 Hereditary and idiopathic neuropathy, unspecified: Secondary | ICD-10-CM | POA: Diagnosis not present

## 2016-06-15 DIAGNOSIS — G8929 Other chronic pain: Secondary | ICD-10-CM | POA: Diagnosis not present

## 2016-06-15 DIAGNOSIS — Z466 Encounter for fitting and adjustment of urinary device: Secondary | ICD-10-CM | POA: Diagnosis not present

## 2016-06-15 DIAGNOSIS — M549 Dorsalgia, unspecified: Secondary | ICD-10-CM | POA: Diagnosis not present

## 2016-06-16 DIAGNOSIS — Z466 Encounter for fitting and adjustment of urinary device: Secondary | ICD-10-CM | POA: Diagnosis not present

## 2016-06-16 DIAGNOSIS — L89324 Pressure ulcer of left buttock, stage 4: Secondary | ICD-10-CM | POA: Diagnosis not present

## 2016-06-16 DIAGNOSIS — G950 Syringomyelia and syringobulbia: Secondary | ICD-10-CM | POA: Diagnosis not present

## 2016-06-16 DIAGNOSIS — G609 Hereditary and idiopathic neuropathy, unspecified: Secondary | ICD-10-CM | POA: Diagnosis not present

## 2016-06-16 DIAGNOSIS — M549 Dorsalgia, unspecified: Secondary | ICD-10-CM | POA: Diagnosis not present

## 2016-06-16 DIAGNOSIS — G8929 Other chronic pain: Secondary | ICD-10-CM | POA: Diagnosis not present

## 2016-06-17 DIAGNOSIS — L89324 Pressure ulcer of left buttock, stage 4: Secondary | ICD-10-CM | POA: Diagnosis not present

## 2016-06-17 DIAGNOSIS — M549 Dorsalgia, unspecified: Secondary | ICD-10-CM | POA: Diagnosis not present

## 2016-06-17 DIAGNOSIS — G950 Syringomyelia and syringobulbia: Secondary | ICD-10-CM | POA: Diagnosis not present

## 2016-06-17 DIAGNOSIS — Z466 Encounter for fitting and adjustment of urinary device: Secondary | ICD-10-CM | POA: Diagnosis not present

## 2016-06-17 DIAGNOSIS — G609 Hereditary and idiopathic neuropathy, unspecified: Secondary | ICD-10-CM | POA: Diagnosis not present

## 2016-06-17 DIAGNOSIS — G8929 Other chronic pain: Secondary | ICD-10-CM | POA: Diagnosis not present

## 2016-06-18 DIAGNOSIS — M549 Dorsalgia, unspecified: Secondary | ICD-10-CM | POA: Diagnosis not present

## 2016-06-18 DIAGNOSIS — L89324 Pressure ulcer of left buttock, stage 4: Secondary | ICD-10-CM | POA: Diagnosis not present

## 2016-06-18 DIAGNOSIS — G8929 Other chronic pain: Secondary | ICD-10-CM | POA: Diagnosis not present

## 2016-06-18 DIAGNOSIS — G950 Syringomyelia and syringobulbia: Secondary | ICD-10-CM | POA: Diagnosis not present

## 2016-06-18 DIAGNOSIS — Z466 Encounter for fitting and adjustment of urinary device: Secondary | ICD-10-CM | POA: Diagnosis not present

## 2016-06-18 DIAGNOSIS — G609 Hereditary and idiopathic neuropathy, unspecified: Secondary | ICD-10-CM | POA: Diagnosis not present

## 2016-06-19 DIAGNOSIS — Z466 Encounter for fitting and adjustment of urinary device: Secondary | ICD-10-CM | POA: Diagnosis not present

## 2016-06-19 DIAGNOSIS — M549 Dorsalgia, unspecified: Secondary | ICD-10-CM | POA: Diagnosis not present

## 2016-06-19 DIAGNOSIS — G609 Hereditary and idiopathic neuropathy, unspecified: Secondary | ICD-10-CM | POA: Diagnosis not present

## 2016-06-19 DIAGNOSIS — L89324 Pressure ulcer of left buttock, stage 4: Secondary | ICD-10-CM | POA: Diagnosis not present

## 2016-06-19 DIAGNOSIS — G8929 Other chronic pain: Secondary | ICD-10-CM | POA: Diagnosis not present

## 2016-06-19 DIAGNOSIS — G950 Syringomyelia and syringobulbia: Secondary | ICD-10-CM | POA: Diagnosis not present

## 2016-06-23 DIAGNOSIS — G609 Hereditary and idiopathic neuropathy, unspecified: Secondary | ICD-10-CM | POA: Diagnosis not present

## 2016-06-23 DIAGNOSIS — G950 Syringomyelia and syringobulbia: Secondary | ICD-10-CM | POA: Diagnosis not present

## 2016-06-23 DIAGNOSIS — G8929 Other chronic pain: Secondary | ICD-10-CM | POA: Diagnosis not present

## 2016-06-23 DIAGNOSIS — L89324 Pressure ulcer of left buttock, stage 4: Secondary | ICD-10-CM | POA: Diagnosis not present

## 2016-06-23 DIAGNOSIS — Z466 Encounter for fitting and adjustment of urinary device: Secondary | ICD-10-CM | POA: Diagnosis not present

## 2016-06-23 DIAGNOSIS — M549 Dorsalgia, unspecified: Secondary | ICD-10-CM | POA: Diagnosis not present

## 2016-06-24 DIAGNOSIS — G950 Syringomyelia and syringobulbia: Secondary | ICD-10-CM | POA: Diagnosis not present

## 2016-06-24 DIAGNOSIS — M549 Dorsalgia, unspecified: Secondary | ICD-10-CM | POA: Diagnosis not present

## 2016-06-24 DIAGNOSIS — Z466 Encounter for fitting and adjustment of urinary device: Secondary | ICD-10-CM | POA: Diagnosis not present

## 2016-06-24 DIAGNOSIS — G609 Hereditary and idiopathic neuropathy, unspecified: Secondary | ICD-10-CM | POA: Diagnosis not present

## 2016-06-24 DIAGNOSIS — G8929 Other chronic pain: Secondary | ICD-10-CM | POA: Diagnosis not present

## 2016-06-24 DIAGNOSIS — L89324 Pressure ulcer of left buttock, stage 4: Secondary | ICD-10-CM | POA: Diagnosis not present

## 2016-06-25 DIAGNOSIS — L89324 Pressure ulcer of left buttock, stage 4: Secondary | ICD-10-CM | POA: Diagnosis not present

## 2016-06-25 DIAGNOSIS — M549 Dorsalgia, unspecified: Secondary | ICD-10-CM | POA: Diagnosis not present

## 2016-06-25 DIAGNOSIS — G609 Hereditary and idiopathic neuropathy, unspecified: Secondary | ICD-10-CM | POA: Diagnosis not present

## 2016-06-25 DIAGNOSIS — G8929 Other chronic pain: Secondary | ICD-10-CM | POA: Diagnosis not present

## 2016-06-25 DIAGNOSIS — Z466 Encounter for fitting and adjustment of urinary device: Secondary | ICD-10-CM | POA: Diagnosis not present

## 2016-06-25 DIAGNOSIS — G950 Syringomyelia and syringobulbia: Secondary | ICD-10-CM | POA: Diagnosis not present

## 2016-06-30 DIAGNOSIS — G609 Hereditary and idiopathic neuropathy, unspecified: Secondary | ICD-10-CM | POA: Diagnosis not present

## 2016-06-30 DIAGNOSIS — G8929 Other chronic pain: Secondary | ICD-10-CM | POA: Diagnosis not present

## 2016-06-30 DIAGNOSIS — M549 Dorsalgia, unspecified: Secondary | ICD-10-CM | POA: Diagnosis not present

## 2016-06-30 DIAGNOSIS — L89324 Pressure ulcer of left buttock, stage 4: Secondary | ICD-10-CM | POA: Diagnosis not present

## 2016-06-30 DIAGNOSIS — G950 Syringomyelia and syringobulbia: Secondary | ICD-10-CM | POA: Diagnosis not present

## 2016-06-30 DIAGNOSIS — Z466 Encounter for fitting and adjustment of urinary device: Secondary | ICD-10-CM | POA: Diagnosis not present

## 2016-07-02 DIAGNOSIS — G950 Syringomyelia and syringobulbia: Secondary | ICD-10-CM | POA: Diagnosis not present

## 2016-07-02 DIAGNOSIS — Z466 Encounter for fitting and adjustment of urinary device: Secondary | ICD-10-CM | POA: Diagnosis not present

## 2016-07-02 DIAGNOSIS — M549 Dorsalgia, unspecified: Secondary | ICD-10-CM | POA: Diagnosis not present

## 2016-07-02 DIAGNOSIS — G8929 Other chronic pain: Secondary | ICD-10-CM | POA: Diagnosis not present

## 2016-07-02 DIAGNOSIS — G609 Hereditary and idiopathic neuropathy, unspecified: Secondary | ICD-10-CM | POA: Diagnosis not present

## 2016-07-02 DIAGNOSIS — L89324 Pressure ulcer of left buttock, stage 4: Secondary | ICD-10-CM | POA: Diagnosis not present

## 2016-07-03 DIAGNOSIS — G8929 Other chronic pain: Secondary | ICD-10-CM | POA: Diagnosis not present

## 2016-07-03 DIAGNOSIS — G609 Hereditary and idiopathic neuropathy, unspecified: Secondary | ICD-10-CM | POA: Diagnosis not present

## 2016-07-03 DIAGNOSIS — G950 Syringomyelia and syringobulbia: Secondary | ICD-10-CM | POA: Diagnosis not present

## 2016-07-03 DIAGNOSIS — Z466 Encounter for fitting and adjustment of urinary device: Secondary | ICD-10-CM | POA: Diagnosis not present

## 2016-07-03 DIAGNOSIS — L89324 Pressure ulcer of left buttock, stage 4: Secondary | ICD-10-CM | POA: Diagnosis not present

## 2016-07-03 DIAGNOSIS — M549 Dorsalgia, unspecified: Secondary | ICD-10-CM | POA: Diagnosis not present

## 2016-07-07 DIAGNOSIS — S142XXA Injury of nerve root of cervical spine, initial encounter: Secondary | ICD-10-CM | POA: Diagnosis not present

## 2016-07-07 DIAGNOSIS — M1288 Other specific arthropathies, not elsewhere classified, other specified site: Secondary | ICD-10-CM | POA: Diagnosis not present

## 2016-07-07 DIAGNOSIS — G894 Chronic pain syndrome: Secondary | ICD-10-CM | POA: Diagnosis not present

## 2016-07-07 DIAGNOSIS — R279 Unspecified lack of coordination: Secondary | ICD-10-CM | POA: Diagnosis not present

## 2016-07-07 DIAGNOSIS — Z79891 Long term (current) use of opiate analgesic: Secondary | ICD-10-CM | POA: Diagnosis not present

## 2016-07-07 DIAGNOSIS — M546 Pain in thoracic spine: Secondary | ICD-10-CM | POA: Diagnosis not present

## 2016-07-07 DIAGNOSIS — Z79899 Other long term (current) drug therapy: Secondary | ICD-10-CM | POA: Diagnosis not present

## 2016-07-07 DIAGNOSIS — M79606 Pain in leg, unspecified: Secondary | ICD-10-CM | POA: Diagnosis not present

## 2016-07-07 DIAGNOSIS — Z743 Need for continuous supervision: Secondary | ICD-10-CM | POA: Diagnosis not present

## 2016-07-08 DIAGNOSIS — G8929 Other chronic pain: Secondary | ICD-10-CM | POA: Diagnosis not present

## 2016-07-08 DIAGNOSIS — M549 Dorsalgia, unspecified: Secondary | ICD-10-CM | POA: Diagnosis not present

## 2016-07-08 DIAGNOSIS — Z466 Encounter for fitting and adjustment of urinary device: Secondary | ICD-10-CM | POA: Diagnosis not present

## 2016-07-08 DIAGNOSIS — L89324 Pressure ulcer of left buttock, stage 4: Secondary | ICD-10-CM | POA: Diagnosis not present

## 2016-07-08 DIAGNOSIS — G609 Hereditary and idiopathic neuropathy, unspecified: Secondary | ICD-10-CM | POA: Diagnosis not present

## 2016-07-08 DIAGNOSIS — G950 Syringomyelia and syringobulbia: Secondary | ICD-10-CM | POA: Diagnosis not present

## 2016-07-12 DIAGNOSIS — G609 Hereditary and idiopathic neuropathy, unspecified: Secondary | ICD-10-CM | POA: Diagnosis not present

## 2016-07-12 DIAGNOSIS — M549 Dorsalgia, unspecified: Secondary | ICD-10-CM | POA: Diagnosis not present

## 2016-07-12 DIAGNOSIS — G8929 Other chronic pain: Secondary | ICD-10-CM | POA: Diagnosis not present

## 2016-07-12 DIAGNOSIS — G950 Syringomyelia and syringobulbia: Secondary | ICD-10-CM | POA: Diagnosis not present

## 2016-07-12 DIAGNOSIS — L89324 Pressure ulcer of left buttock, stage 4: Secondary | ICD-10-CM | POA: Diagnosis not present

## 2016-07-12 DIAGNOSIS — Z466 Encounter for fitting and adjustment of urinary device: Secondary | ICD-10-CM | POA: Diagnosis not present

## 2016-07-16 DIAGNOSIS — Z743 Need for continuous supervision: Secondary | ICD-10-CM | POA: Diagnosis not present

## 2016-07-16 DIAGNOSIS — R279 Unspecified lack of coordination: Secondary | ICD-10-CM | POA: Diagnosis not present

## 2016-07-17 DIAGNOSIS — Z466 Encounter for fitting and adjustment of urinary device: Secondary | ICD-10-CM | POA: Diagnosis not present

## 2016-07-17 DIAGNOSIS — G609 Hereditary and idiopathic neuropathy, unspecified: Secondary | ICD-10-CM | POA: Diagnosis not present

## 2016-07-17 DIAGNOSIS — M549 Dorsalgia, unspecified: Secondary | ICD-10-CM | POA: Diagnosis not present

## 2016-07-17 DIAGNOSIS — G950 Syringomyelia and syringobulbia: Secondary | ICD-10-CM | POA: Diagnosis not present

## 2016-07-17 DIAGNOSIS — G8929 Other chronic pain: Secondary | ICD-10-CM | POA: Diagnosis not present

## 2016-07-17 DIAGNOSIS — L89324 Pressure ulcer of left buttock, stage 4: Secondary | ICD-10-CM | POA: Diagnosis not present

## 2016-07-21 DIAGNOSIS — M549 Dorsalgia, unspecified: Secondary | ICD-10-CM | POA: Diagnosis not present

## 2016-07-21 DIAGNOSIS — L89324 Pressure ulcer of left buttock, stage 4: Secondary | ICD-10-CM | POA: Diagnosis not present

## 2016-07-21 DIAGNOSIS — G8929 Other chronic pain: Secondary | ICD-10-CM | POA: Diagnosis not present

## 2016-07-21 DIAGNOSIS — G950 Syringomyelia and syringobulbia: Secondary | ICD-10-CM | POA: Diagnosis not present

## 2016-07-21 DIAGNOSIS — Z466 Encounter for fitting and adjustment of urinary device: Secondary | ICD-10-CM | POA: Diagnosis not present

## 2016-07-21 DIAGNOSIS — G609 Hereditary and idiopathic neuropathy, unspecified: Secondary | ICD-10-CM | POA: Diagnosis not present

## 2016-07-22 DIAGNOSIS — M549 Dorsalgia, unspecified: Secondary | ICD-10-CM | POA: Diagnosis not present

## 2016-07-22 DIAGNOSIS — Z466 Encounter for fitting and adjustment of urinary device: Secondary | ICD-10-CM | POA: Diagnosis not present

## 2016-07-22 DIAGNOSIS — G8929 Other chronic pain: Secondary | ICD-10-CM | POA: Diagnosis not present

## 2016-07-22 DIAGNOSIS — G609 Hereditary and idiopathic neuropathy, unspecified: Secondary | ICD-10-CM | POA: Diagnosis not present

## 2016-07-22 DIAGNOSIS — G950 Syringomyelia and syringobulbia: Secondary | ICD-10-CM | POA: Diagnosis not present

## 2016-07-22 DIAGNOSIS — L89324 Pressure ulcer of left buttock, stage 4: Secondary | ICD-10-CM | POA: Diagnosis not present

## 2016-07-31 DIAGNOSIS — Z466 Encounter for fitting and adjustment of urinary device: Secondary | ICD-10-CM | POA: Diagnosis not present

## 2016-07-31 DIAGNOSIS — L89324 Pressure ulcer of left buttock, stage 4: Secondary | ICD-10-CM | POA: Diagnosis not present

## 2016-07-31 DIAGNOSIS — G950 Syringomyelia and syringobulbia: Secondary | ICD-10-CM | POA: Diagnosis not present

## 2016-07-31 DIAGNOSIS — G8929 Other chronic pain: Secondary | ICD-10-CM | POA: Diagnosis not present

## 2016-07-31 DIAGNOSIS — M549 Dorsalgia, unspecified: Secondary | ICD-10-CM | POA: Diagnosis not present

## 2016-07-31 DIAGNOSIS — G609 Hereditary and idiopathic neuropathy, unspecified: Secondary | ICD-10-CM | POA: Diagnosis not present

## 2016-08-06 DIAGNOSIS — G950 Syringomyelia and syringobulbia: Secondary | ICD-10-CM | POA: Diagnosis not present

## 2016-08-06 DIAGNOSIS — M549 Dorsalgia, unspecified: Secondary | ICD-10-CM | POA: Diagnosis not present

## 2016-08-06 DIAGNOSIS — L89324 Pressure ulcer of left buttock, stage 4: Secondary | ICD-10-CM | POA: Diagnosis not present

## 2016-08-06 DIAGNOSIS — Z466 Encounter for fitting and adjustment of urinary device: Secondary | ICD-10-CM | POA: Diagnosis not present

## 2016-08-06 DIAGNOSIS — G8929 Other chronic pain: Secondary | ICD-10-CM | POA: Diagnosis not present

## 2016-08-06 DIAGNOSIS — G609 Hereditary and idiopathic neuropathy, unspecified: Secondary | ICD-10-CM | POA: Diagnosis not present

## 2016-08-07 DIAGNOSIS — M5489 Other dorsalgia: Secondary | ICD-10-CM | POA: Diagnosis not present

## 2016-08-07 DIAGNOSIS — F329 Major depressive disorder, single episode, unspecified: Secondary | ICD-10-CM | POA: Diagnosis not present

## 2016-08-07 DIAGNOSIS — M549 Dorsalgia, unspecified: Secondary | ICD-10-CM | POA: Diagnosis not present

## 2016-08-07 DIAGNOSIS — Z466 Encounter for fitting and adjustment of urinary device: Secondary | ICD-10-CM | POA: Diagnosis not present

## 2016-08-07 DIAGNOSIS — R202 Paresthesia of skin: Secondary | ICD-10-CM | POA: Diagnosis not present

## 2016-08-07 DIAGNOSIS — G609 Hereditary and idiopathic neuropathy, unspecified: Secondary | ICD-10-CM | POA: Diagnosis not present

## 2016-08-07 DIAGNOSIS — Z87440 Personal history of urinary (tract) infections: Secondary | ICD-10-CM | POA: Diagnosis not present

## 2016-08-07 DIAGNOSIS — G8929 Other chronic pain: Secondary | ICD-10-CM | POA: Diagnosis not present

## 2016-08-07 DIAGNOSIS — E669 Obesity, unspecified: Secondary | ICD-10-CM | POA: Diagnosis not present

## 2016-08-07 DIAGNOSIS — F419 Anxiety disorder, unspecified: Secondary | ICD-10-CM | POA: Diagnosis not present

## 2016-08-07 DIAGNOSIS — G950 Syringomyelia and syringobulbia: Secondary | ICD-10-CM | POA: Diagnosis not present

## 2016-08-07 DIAGNOSIS — G608 Other hereditary and idiopathic neuropathies: Secondary | ICD-10-CM | POA: Diagnosis not present

## 2016-08-10 DIAGNOSIS — M549 Dorsalgia, unspecified: Secondary | ICD-10-CM | POA: Diagnosis not present

## 2016-08-10 DIAGNOSIS — G8929 Other chronic pain: Secondary | ICD-10-CM | POA: Diagnosis not present

## 2016-08-10 DIAGNOSIS — G950 Syringomyelia and syringobulbia: Secondary | ICD-10-CM | POA: Diagnosis not present

## 2016-08-10 DIAGNOSIS — Z466 Encounter for fitting and adjustment of urinary device: Secondary | ICD-10-CM | POA: Diagnosis not present

## 2016-08-10 DIAGNOSIS — F419 Anxiety disorder, unspecified: Secondary | ICD-10-CM | POA: Diagnosis not present

## 2016-08-10 DIAGNOSIS — G609 Hereditary and idiopathic neuropathy, unspecified: Secondary | ICD-10-CM | POA: Diagnosis not present

## 2016-08-18 DIAGNOSIS — M546 Pain in thoracic spine: Secondary | ICD-10-CM | POA: Diagnosis not present

## 2016-08-18 DIAGNOSIS — R279 Unspecified lack of coordination: Secondary | ICD-10-CM | POA: Diagnosis not present

## 2016-08-18 DIAGNOSIS — M79606 Pain in leg, unspecified: Secondary | ICD-10-CM | POA: Diagnosis not present

## 2016-08-18 DIAGNOSIS — Z743 Need for continuous supervision: Secondary | ICD-10-CM | POA: Diagnosis not present

## 2016-08-18 DIAGNOSIS — G894 Chronic pain syndrome: Secondary | ICD-10-CM | POA: Diagnosis not present

## 2016-08-18 DIAGNOSIS — M545 Low back pain: Secondary | ICD-10-CM | POA: Diagnosis not present

## 2016-08-19 DIAGNOSIS — F419 Anxiety disorder, unspecified: Secondary | ICD-10-CM | POA: Diagnosis not present

## 2016-08-19 DIAGNOSIS — Z466 Encounter for fitting and adjustment of urinary device: Secondary | ICD-10-CM | POA: Diagnosis not present

## 2016-08-19 DIAGNOSIS — M549 Dorsalgia, unspecified: Secondary | ICD-10-CM | POA: Diagnosis not present

## 2016-08-19 DIAGNOSIS — G950 Syringomyelia and syringobulbia: Secondary | ICD-10-CM | POA: Diagnosis not present

## 2016-08-19 DIAGNOSIS — G609 Hereditary and idiopathic neuropathy, unspecified: Secondary | ICD-10-CM | POA: Diagnosis not present

## 2016-08-19 DIAGNOSIS — G8929 Other chronic pain: Secondary | ICD-10-CM | POA: Diagnosis not present

## 2016-08-27 DIAGNOSIS — Z7401 Bed confinement status: Secondary | ICD-10-CM | POA: Diagnosis not present

## 2016-08-27 DIAGNOSIS — M545 Low back pain: Secondary | ICD-10-CM | POA: Diagnosis not present

## 2016-08-27 DIAGNOSIS — R279 Unspecified lack of coordination: Secondary | ICD-10-CM | POA: Diagnosis not present

## 2016-08-27 DIAGNOSIS — Z743 Need for continuous supervision: Secondary | ICD-10-CM | POA: Diagnosis not present

## 2016-09-15 DIAGNOSIS — M549 Dorsalgia, unspecified: Secondary | ICD-10-CM | POA: Diagnosis not present

## 2016-09-15 DIAGNOSIS — G950 Syringomyelia and syringobulbia: Secondary | ICD-10-CM | POA: Diagnosis not present

## 2016-09-15 DIAGNOSIS — Z466 Encounter for fitting and adjustment of urinary device: Secondary | ICD-10-CM | POA: Diagnosis not present

## 2016-09-15 DIAGNOSIS — G8929 Other chronic pain: Secondary | ICD-10-CM | POA: Diagnosis not present

## 2016-09-15 DIAGNOSIS — F419 Anxiety disorder, unspecified: Secondary | ICD-10-CM | POA: Diagnosis not present

## 2016-09-15 DIAGNOSIS — G609 Hereditary and idiopathic neuropathy, unspecified: Secondary | ICD-10-CM | POA: Diagnosis not present

## 2016-09-21 DIAGNOSIS — G950 Syringomyelia and syringobulbia: Secondary | ICD-10-CM | POA: Diagnosis not present

## 2016-09-21 DIAGNOSIS — F419 Anxiety disorder, unspecified: Secondary | ICD-10-CM | POA: Diagnosis not present

## 2016-09-21 DIAGNOSIS — G8929 Other chronic pain: Secondary | ICD-10-CM | POA: Diagnosis not present

## 2016-09-21 DIAGNOSIS — M549 Dorsalgia, unspecified: Secondary | ICD-10-CM | POA: Diagnosis not present

## 2016-09-21 DIAGNOSIS — G609 Hereditary and idiopathic neuropathy, unspecified: Secondary | ICD-10-CM | POA: Diagnosis not present

## 2016-09-21 DIAGNOSIS — Z466 Encounter for fitting and adjustment of urinary device: Secondary | ICD-10-CM | POA: Diagnosis not present

## 2016-09-22 DIAGNOSIS — G8222 Paraplegia, incomplete: Secondary | ICD-10-CM | POA: Diagnosis not present

## 2016-09-22 DIAGNOSIS — G8929 Other chronic pain: Secondary | ICD-10-CM | POA: Diagnosis not present

## 2016-09-22 DIAGNOSIS — F900 Attention-deficit hyperactivity disorder, predominantly inattentive type: Secondary | ICD-10-CM | POA: Diagnosis not present

## 2016-09-22 DIAGNOSIS — F411 Generalized anxiety disorder: Secondary | ICD-10-CM | POA: Diagnosis not present

## 2016-09-22 DIAGNOSIS — F320 Major depressive disorder, single episode, mild: Secondary | ICD-10-CM | POA: Diagnosis not present

## 2016-09-22 DIAGNOSIS — K210 Gastro-esophageal reflux disease with esophagitis: Secondary | ICD-10-CM | POA: Diagnosis not present

## 2016-09-30 DIAGNOSIS — R279 Unspecified lack of coordination: Secondary | ICD-10-CM | POA: Diagnosis not present

## 2016-09-30 DIAGNOSIS — G8220 Paraplegia, unspecified: Secondary | ICD-10-CM | POA: Diagnosis not present

## 2016-09-30 DIAGNOSIS — G950 Syringomyelia and syringobulbia: Secondary | ICD-10-CM | POA: Diagnosis not present

## 2016-09-30 DIAGNOSIS — M50323 Other cervical disc degeneration at C6-C7 level: Secondary | ICD-10-CM | POA: Diagnosis not present

## 2016-09-30 DIAGNOSIS — Z7401 Bed confinement status: Secondary | ICD-10-CM | POA: Diagnosis not present

## 2016-09-30 DIAGNOSIS — M5124 Other intervertebral disc displacement, thoracic region: Secondary | ICD-10-CM | POA: Diagnosis not present

## 2016-09-30 DIAGNOSIS — M5134 Other intervertebral disc degeneration, thoracic region: Secondary | ICD-10-CM | POA: Diagnosis not present

## 2016-10-02 DIAGNOSIS — G8929 Other chronic pain: Secondary | ICD-10-CM | POA: Diagnosis not present

## 2016-10-02 DIAGNOSIS — G609 Hereditary and idiopathic neuropathy, unspecified: Secondary | ICD-10-CM | POA: Diagnosis not present

## 2016-10-02 DIAGNOSIS — M549 Dorsalgia, unspecified: Secondary | ICD-10-CM | POA: Diagnosis not present

## 2016-10-02 DIAGNOSIS — G950 Syringomyelia and syringobulbia: Secondary | ICD-10-CM | POA: Diagnosis not present

## 2016-10-02 DIAGNOSIS — F419 Anxiety disorder, unspecified: Secondary | ICD-10-CM | POA: Diagnosis not present

## 2016-10-02 DIAGNOSIS — Z466 Encounter for fitting and adjustment of urinary device: Secondary | ICD-10-CM | POA: Diagnosis not present

## 2016-10-03 DIAGNOSIS — M549 Dorsalgia, unspecified: Secondary | ICD-10-CM | POA: Diagnosis not present

## 2016-10-03 DIAGNOSIS — G950 Syringomyelia and syringobulbia: Secondary | ICD-10-CM | POA: Diagnosis not present

## 2016-10-03 DIAGNOSIS — F419 Anxiety disorder, unspecified: Secondary | ICD-10-CM | POA: Diagnosis not present

## 2016-10-03 DIAGNOSIS — Z466 Encounter for fitting and adjustment of urinary device: Secondary | ICD-10-CM | POA: Diagnosis not present

## 2016-10-03 DIAGNOSIS — G609 Hereditary and idiopathic neuropathy, unspecified: Secondary | ICD-10-CM | POA: Diagnosis not present

## 2016-10-03 DIAGNOSIS — G8929 Other chronic pain: Secondary | ICD-10-CM | POA: Diagnosis not present

## 2016-10-06 DIAGNOSIS — R202 Paresthesia of skin: Secondary | ICD-10-CM | POA: Diagnosis not present

## 2016-10-06 DIAGNOSIS — F329 Major depressive disorder, single episode, unspecified: Secondary | ICD-10-CM | POA: Diagnosis not present

## 2016-10-06 DIAGNOSIS — M549 Dorsalgia, unspecified: Secondary | ICD-10-CM | POA: Diagnosis not present

## 2016-10-06 DIAGNOSIS — F419 Anxiety disorder, unspecified: Secondary | ICD-10-CM | POA: Diagnosis not present

## 2016-10-06 DIAGNOSIS — Z87440 Personal history of urinary (tract) infections: Secondary | ICD-10-CM | POA: Diagnosis not present

## 2016-10-06 DIAGNOSIS — G8929 Other chronic pain: Secondary | ICD-10-CM | POA: Diagnosis not present

## 2016-10-06 DIAGNOSIS — G608 Other hereditary and idiopathic neuropathies: Secondary | ICD-10-CM | POA: Diagnosis not present

## 2016-10-06 DIAGNOSIS — G950 Syringomyelia and syringobulbia: Secondary | ICD-10-CM | POA: Diagnosis not present

## 2016-10-06 DIAGNOSIS — E669 Obesity, unspecified: Secondary | ICD-10-CM | POA: Diagnosis not present

## 2016-10-06 DIAGNOSIS — M5489 Other dorsalgia: Secondary | ICD-10-CM | POA: Diagnosis not present

## 2016-10-06 DIAGNOSIS — Z466 Encounter for fitting and adjustment of urinary device: Secondary | ICD-10-CM | POA: Diagnosis not present

## 2016-10-06 DIAGNOSIS — G609 Hereditary and idiopathic neuropathy, unspecified: Secondary | ICD-10-CM | POA: Diagnosis not present

## 2016-10-13 DIAGNOSIS — G894 Chronic pain syndrome: Secondary | ICD-10-CM | POA: Diagnosis not present

## 2016-10-13 DIAGNOSIS — R279 Unspecified lack of coordination: Secondary | ICD-10-CM | POA: Diagnosis not present

## 2016-10-13 DIAGNOSIS — Z466 Encounter for fitting and adjustment of urinary device: Secondary | ICD-10-CM | POA: Diagnosis not present

## 2016-10-13 DIAGNOSIS — M79606 Pain in leg, unspecified: Secondary | ICD-10-CM | POA: Diagnosis not present

## 2016-10-13 DIAGNOSIS — G609 Hereditary and idiopathic neuropathy, unspecified: Secondary | ICD-10-CM | POA: Diagnosis not present

## 2016-10-13 DIAGNOSIS — G950 Syringomyelia and syringobulbia: Secondary | ICD-10-CM | POA: Diagnosis not present

## 2016-10-13 DIAGNOSIS — M549 Dorsalgia, unspecified: Secondary | ICD-10-CM | POA: Diagnosis not present

## 2016-10-13 DIAGNOSIS — E669 Obesity, unspecified: Secondary | ICD-10-CM | POA: Diagnosis not present

## 2016-10-13 DIAGNOSIS — G8929 Other chronic pain: Secondary | ICD-10-CM | POA: Diagnosis not present

## 2016-10-13 DIAGNOSIS — Z9181 History of falling: Secondary | ICD-10-CM | POA: Diagnosis not present

## 2016-10-13 DIAGNOSIS — Z743 Need for continuous supervision: Secondary | ICD-10-CM | POA: Diagnosis not present

## 2016-10-13 DIAGNOSIS — F419 Anxiety disorder, unspecified: Secondary | ICD-10-CM | POA: Diagnosis not present

## 2016-10-13 DIAGNOSIS — M546 Pain in thoracic spine: Secondary | ICD-10-CM | POA: Diagnosis not present

## 2016-10-15 DIAGNOSIS — G8929 Other chronic pain: Secondary | ICD-10-CM | POA: Diagnosis not present

## 2016-10-15 DIAGNOSIS — G950 Syringomyelia and syringobulbia: Secondary | ICD-10-CM | POA: Diagnosis not present

## 2016-10-15 DIAGNOSIS — Z466 Encounter for fitting and adjustment of urinary device: Secondary | ICD-10-CM | POA: Diagnosis not present

## 2016-10-15 DIAGNOSIS — G609 Hereditary and idiopathic neuropathy, unspecified: Secondary | ICD-10-CM | POA: Diagnosis not present

## 2016-10-15 DIAGNOSIS — M549 Dorsalgia, unspecified: Secondary | ICD-10-CM | POA: Diagnosis not present

## 2016-10-15 DIAGNOSIS — F419 Anxiety disorder, unspecified: Secondary | ICD-10-CM | POA: Diagnosis not present

## 2016-10-21 DIAGNOSIS — Z466 Encounter for fitting and adjustment of urinary device: Secondary | ICD-10-CM | POA: Diagnosis not present

## 2016-10-21 DIAGNOSIS — F17200 Nicotine dependence, unspecified, uncomplicated: Secondary | ICD-10-CM | POA: Diagnosis not present

## 2016-10-21 DIAGNOSIS — M79605 Pain in left leg: Secondary | ICD-10-CM | POA: Diagnosis not present

## 2016-10-21 DIAGNOSIS — F419 Anxiety disorder, unspecified: Secondary | ICD-10-CM | POA: Diagnosis not present

## 2016-10-21 DIAGNOSIS — R279 Unspecified lack of coordination: Secondary | ICD-10-CM | POA: Diagnosis not present

## 2016-10-21 DIAGNOSIS — M545 Low back pain: Secondary | ICD-10-CM | POA: Diagnosis not present

## 2016-10-21 DIAGNOSIS — G609 Hereditary and idiopathic neuropathy, unspecified: Secondary | ICD-10-CM | POA: Diagnosis not present

## 2016-10-21 DIAGNOSIS — G950 Syringomyelia and syringobulbia: Secondary | ICD-10-CM | POA: Diagnosis not present

## 2016-10-21 DIAGNOSIS — M549 Dorsalgia, unspecified: Secondary | ICD-10-CM | POA: Diagnosis not present

## 2016-10-21 DIAGNOSIS — Z7401 Bed confinement status: Secondary | ICD-10-CM | POA: Diagnosis not present

## 2016-10-21 DIAGNOSIS — M5489 Other dorsalgia: Secondary | ICD-10-CM | POA: Diagnosis not present

## 2016-10-21 DIAGNOSIS — R1110 Vomiting, unspecified: Secondary | ICD-10-CM | POA: Diagnosis not present

## 2016-10-21 DIAGNOSIS — R319 Hematuria, unspecified: Secondary | ICD-10-CM | POA: Diagnosis not present

## 2016-10-21 DIAGNOSIS — R112 Nausea with vomiting, unspecified: Secondary | ICD-10-CM | POA: Diagnosis not present

## 2016-10-21 DIAGNOSIS — R000 Tachycardia, unspecified: Secondary | ICD-10-CM | POA: Diagnosis not present

## 2016-10-21 DIAGNOSIS — E876 Hypokalemia: Secondary | ICD-10-CM | POA: Diagnosis not present

## 2016-10-21 DIAGNOSIS — Z79899 Other long term (current) drug therapy: Secondary | ICD-10-CM | POA: Diagnosis not present

## 2016-10-21 DIAGNOSIS — N390 Urinary tract infection, site not specified: Secondary | ICD-10-CM | POA: Diagnosis not present

## 2016-10-21 DIAGNOSIS — M79604 Pain in right leg: Secondary | ICD-10-CM | POA: Diagnosis not present

## 2016-10-21 DIAGNOSIS — G8929 Other chronic pain: Secondary | ICD-10-CM | POA: Diagnosis not present

## 2016-10-21 DIAGNOSIS — F329 Major depressive disorder, single episode, unspecified: Secondary | ICD-10-CM | POA: Diagnosis not present

## 2016-10-27 DIAGNOSIS — R279 Unspecified lack of coordination: Secondary | ICD-10-CM | POA: Diagnosis not present

## 2016-10-27 DIAGNOSIS — G950 Syringomyelia and syringobulbia: Secondary | ICD-10-CM | POA: Diagnosis not present

## 2016-10-27 DIAGNOSIS — Z743 Need for continuous supervision: Secondary | ICD-10-CM | POA: Diagnosis not present

## 2016-10-27 DIAGNOSIS — Z7401 Bed confinement status: Secondary | ICD-10-CM | POA: Diagnosis not present

## 2016-11-17 DIAGNOSIS — R279 Unspecified lack of coordination: Secondary | ICD-10-CM | POA: Diagnosis not present

## 2016-11-17 DIAGNOSIS — G950 Syringomyelia and syringobulbia: Secondary | ICD-10-CM | POA: Diagnosis not present

## 2016-11-17 DIAGNOSIS — Z7401 Bed confinement status: Secondary | ICD-10-CM | POA: Diagnosis not present

## 2016-11-17 DIAGNOSIS — Z743 Need for continuous supervision: Secondary | ICD-10-CM | POA: Diagnosis not present

## 2016-11-18 DIAGNOSIS — F419 Anxiety disorder, unspecified: Secondary | ICD-10-CM | POA: Diagnosis not present

## 2016-11-18 DIAGNOSIS — Z466 Encounter for fitting and adjustment of urinary device: Secondary | ICD-10-CM | POA: Diagnosis not present

## 2016-11-18 DIAGNOSIS — G609 Hereditary and idiopathic neuropathy, unspecified: Secondary | ICD-10-CM | POA: Diagnosis not present

## 2016-11-18 DIAGNOSIS — G950 Syringomyelia and syringobulbia: Secondary | ICD-10-CM | POA: Diagnosis not present

## 2016-11-18 DIAGNOSIS — M549 Dorsalgia, unspecified: Secondary | ICD-10-CM | POA: Diagnosis not present

## 2016-11-18 DIAGNOSIS — G8929 Other chronic pain: Secondary | ICD-10-CM | POA: Diagnosis not present

## 2016-12-03 DIAGNOSIS — F419 Anxiety disorder, unspecified: Secondary | ICD-10-CM | POA: Diagnosis not present

## 2016-12-03 DIAGNOSIS — G609 Hereditary and idiopathic neuropathy, unspecified: Secondary | ICD-10-CM | POA: Diagnosis not present

## 2016-12-03 DIAGNOSIS — M549 Dorsalgia, unspecified: Secondary | ICD-10-CM | POA: Diagnosis not present

## 2016-12-03 DIAGNOSIS — Z466 Encounter for fitting and adjustment of urinary device: Secondary | ICD-10-CM | POA: Diagnosis not present

## 2016-12-03 DIAGNOSIS — G950 Syringomyelia and syringobulbia: Secondary | ICD-10-CM | POA: Diagnosis not present

## 2016-12-03 DIAGNOSIS — G8929 Other chronic pain: Secondary | ICD-10-CM | POA: Diagnosis not present

## 2016-12-05 DIAGNOSIS — F419 Anxiety disorder, unspecified: Secondary | ICD-10-CM | POA: Diagnosis not present

## 2016-12-05 DIAGNOSIS — G950 Syringomyelia and syringobulbia: Secondary | ICD-10-CM | POA: Diagnosis not present

## 2016-12-05 DIAGNOSIS — M5489 Other dorsalgia: Secondary | ICD-10-CM | POA: Diagnosis not present

## 2016-12-05 DIAGNOSIS — G609 Hereditary and idiopathic neuropathy, unspecified: Secondary | ICD-10-CM | POA: Diagnosis not present

## 2016-12-05 DIAGNOSIS — M549 Dorsalgia, unspecified: Secondary | ICD-10-CM | POA: Diagnosis not present

## 2016-12-05 DIAGNOSIS — Z87440 Personal history of urinary (tract) infections: Secondary | ICD-10-CM | POA: Diagnosis not present

## 2016-12-05 DIAGNOSIS — R202 Paresthesia of skin: Secondary | ICD-10-CM | POA: Diagnosis not present

## 2016-12-05 DIAGNOSIS — F329 Major depressive disorder, single episode, unspecified: Secondary | ICD-10-CM | POA: Diagnosis not present

## 2016-12-05 DIAGNOSIS — G608 Other hereditary and idiopathic neuropathies: Secondary | ICD-10-CM | POA: Diagnosis not present

## 2016-12-05 DIAGNOSIS — Z466 Encounter for fitting and adjustment of urinary device: Secondary | ICD-10-CM | POA: Diagnosis not present

## 2016-12-05 DIAGNOSIS — E669 Obesity, unspecified: Secondary | ICD-10-CM | POA: Diagnosis not present

## 2016-12-05 DIAGNOSIS — G8929 Other chronic pain: Secondary | ICD-10-CM | POA: Diagnosis not present

## 2016-12-07 DIAGNOSIS — M549 Dorsalgia, unspecified: Secondary | ICD-10-CM | POA: Diagnosis not present

## 2016-12-07 DIAGNOSIS — G8929 Other chronic pain: Secondary | ICD-10-CM | POA: Diagnosis not present

## 2016-12-07 DIAGNOSIS — G950 Syringomyelia and syringobulbia: Secondary | ICD-10-CM | POA: Diagnosis not present

## 2016-12-07 DIAGNOSIS — F419 Anxiety disorder, unspecified: Secondary | ICD-10-CM | POA: Diagnosis not present

## 2016-12-07 DIAGNOSIS — G609 Hereditary and idiopathic neuropathy, unspecified: Secondary | ICD-10-CM | POA: Diagnosis not present

## 2016-12-07 DIAGNOSIS — Z466 Encounter for fitting and adjustment of urinary device: Secondary | ICD-10-CM | POA: Diagnosis not present

## 2016-12-15 DIAGNOSIS — M545 Low back pain: Secondary | ICD-10-CM | POA: Diagnosis not present

## 2016-12-15 DIAGNOSIS — M79606 Pain in leg, unspecified: Secondary | ICD-10-CM | POA: Diagnosis not present

## 2016-12-15 DIAGNOSIS — G894 Chronic pain syndrome: Secondary | ICD-10-CM | POA: Diagnosis not present

## 2016-12-15 DIAGNOSIS — M546 Pain in thoracic spine: Secondary | ICD-10-CM | POA: Diagnosis not present

## 2016-12-20 DIAGNOSIS — M549 Dorsalgia, unspecified: Secondary | ICD-10-CM | POA: Diagnosis not present

## 2016-12-20 DIAGNOSIS — Z466 Encounter for fitting and adjustment of urinary device: Secondary | ICD-10-CM | POA: Diagnosis not present

## 2016-12-20 DIAGNOSIS — G8929 Other chronic pain: Secondary | ICD-10-CM | POA: Diagnosis not present

## 2016-12-20 DIAGNOSIS — F419 Anxiety disorder, unspecified: Secondary | ICD-10-CM | POA: Diagnosis not present

## 2016-12-20 DIAGNOSIS — G609 Hereditary and idiopathic neuropathy, unspecified: Secondary | ICD-10-CM | POA: Diagnosis not present

## 2016-12-20 DIAGNOSIS — G950 Syringomyelia and syringobulbia: Secondary | ICD-10-CM | POA: Diagnosis not present

## 2017-01-13 DIAGNOSIS — M549 Dorsalgia, unspecified: Secondary | ICD-10-CM | POA: Diagnosis not present

## 2017-01-13 DIAGNOSIS — G950 Syringomyelia and syringobulbia: Secondary | ICD-10-CM | POA: Diagnosis not present

## 2017-01-13 DIAGNOSIS — G8929 Other chronic pain: Secondary | ICD-10-CM | POA: Diagnosis not present

## 2017-01-13 DIAGNOSIS — G609 Hereditary and idiopathic neuropathy, unspecified: Secondary | ICD-10-CM | POA: Diagnosis not present

## 2017-01-13 DIAGNOSIS — Z466 Encounter for fitting and adjustment of urinary device: Secondary | ICD-10-CM | POA: Diagnosis not present

## 2017-01-13 DIAGNOSIS — F419 Anxiety disorder, unspecified: Secondary | ICD-10-CM | POA: Diagnosis not present

## 2017-01-14 DIAGNOSIS — F411 Generalized anxiety disorder: Secondary | ICD-10-CM | POA: Diagnosis not present

## 2017-01-14 DIAGNOSIS — Z0000 Encounter for general adult medical examination without abnormal findings: Secondary | ICD-10-CM | POA: Diagnosis not present

## 2017-01-14 DIAGNOSIS — K210 Gastro-esophageal reflux disease with esophagitis: Secondary | ICD-10-CM | POA: Diagnosis not present

## 2017-01-14 DIAGNOSIS — F900 Attention-deficit hyperactivity disorder, predominantly inattentive type: Secondary | ICD-10-CM | POA: Diagnosis not present

## 2017-01-14 DIAGNOSIS — G8222 Paraplegia, incomplete: Secondary | ICD-10-CM | POA: Diagnosis not present

## 2017-01-14 DIAGNOSIS — G8929 Other chronic pain: Secondary | ICD-10-CM | POA: Diagnosis not present

## 2017-01-14 DIAGNOSIS — F320 Major depressive disorder, single episode, mild: Secondary | ICD-10-CM | POA: Diagnosis not present

## 2017-01-15 DIAGNOSIS — G609 Hereditary and idiopathic neuropathy, unspecified: Secondary | ICD-10-CM | POA: Diagnosis not present

## 2017-01-15 DIAGNOSIS — Z466 Encounter for fitting and adjustment of urinary device: Secondary | ICD-10-CM | POA: Diagnosis not present

## 2017-01-15 DIAGNOSIS — G8929 Other chronic pain: Secondary | ICD-10-CM | POA: Diagnosis not present

## 2017-01-15 DIAGNOSIS — F419 Anxiety disorder, unspecified: Secondary | ICD-10-CM | POA: Diagnosis not present

## 2017-01-15 DIAGNOSIS — G950 Syringomyelia and syringobulbia: Secondary | ICD-10-CM | POA: Diagnosis not present

## 2017-01-15 DIAGNOSIS — M549 Dorsalgia, unspecified: Secondary | ICD-10-CM | POA: Diagnosis not present

## 2017-01-26 DIAGNOSIS — M549 Dorsalgia, unspecified: Secondary | ICD-10-CM | POA: Diagnosis not present

## 2017-01-26 DIAGNOSIS — F419 Anxiety disorder, unspecified: Secondary | ICD-10-CM | POA: Diagnosis not present

## 2017-01-26 DIAGNOSIS — G8929 Other chronic pain: Secondary | ICD-10-CM | POA: Diagnosis not present

## 2017-01-26 DIAGNOSIS — G609 Hereditary and idiopathic neuropathy, unspecified: Secondary | ICD-10-CM | POA: Diagnosis not present

## 2017-01-26 DIAGNOSIS — Z466 Encounter for fitting and adjustment of urinary device: Secondary | ICD-10-CM | POA: Diagnosis not present

## 2017-01-26 DIAGNOSIS — G950 Syringomyelia and syringobulbia: Secondary | ICD-10-CM | POA: Diagnosis not present

## 2017-01-28 DIAGNOSIS — M5126 Other intervertebral disc displacement, lumbar region: Secondary | ICD-10-CM | POA: Diagnosis not present

## 2017-01-28 DIAGNOSIS — G1225 Progressive spinal muscle atrophy: Secondary | ICD-10-CM | POA: Diagnosis not present

## 2017-01-28 DIAGNOSIS — M79605 Pain in left leg: Secondary | ICD-10-CM | POA: Diagnosis not present

## 2017-01-28 DIAGNOSIS — M545 Low back pain: Secondary | ICD-10-CM | POA: Diagnosis not present

## 2017-01-28 DIAGNOSIS — M79604 Pain in right leg: Secondary | ICD-10-CM | POA: Diagnosis not present

## 2017-01-28 DIAGNOSIS — M9983 Other biomechanical lesions of lumbar region: Secondary | ICD-10-CM | POA: Diagnosis not present

## 2017-02-01 DIAGNOSIS — F419 Anxiety disorder, unspecified: Secondary | ICD-10-CM | POA: Diagnosis not present

## 2017-02-01 DIAGNOSIS — G609 Hereditary and idiopathic neuropathy, unspecified: Secondary | ICD-10-CM | POA: Diagnosis not present

## 2017-02-01 DIAGNOSIS — G950 Syringomyelia and syringobulbia: Secondary | ICD-10-CM | POA: Diagnosis not present

## 2017-02-01 DIAGNOSIS — M549 Dorsalgia, unspecified: Secondary | ICD-10-CM | POA: Diagnosis not present

## 2017-02-01 DIAGNOSIS — G8929 Other chronic pain: Secondary | ICD-10-CM | POA: Diagnosis not present

## 2017-02-01 DIAGNOSIS — Z466 Encounter for fitting and adjustment of urinary device: Secondary | ICD-10-CM | POA: Diagnosis not present

## 2017-02-03 DIAGNOSIS — F329 Major depressive disorder, single episode, unspecified: Secondary | ICD-10-CM | POA: Diagnosis not present

## 2017-02-03 DIAGNOSIS — E669 Obesity, unspecified: Secondary | ICD-10-CM | POA: Diagnosis not present

## 2017-02-03 DIAGNOSIS — G8929 Other chronic pain: Secondary | ICD-10-CM | POA: Diagnosis not present

## 2017-02-03 DIAGNOSIS — G608 Other hereditary and idiopathic neuropathies: Secondary | ICD-10-CM | POA: Diagnosis not present

## 2017-02-03 DIAGNOSIS — R202 Paresthesia of skin: Secondary | ICD-10-CM | POA: Diagnosis not present

## 2017-02-03 DIAGNOSIS — F419 Anxiety disorder, unspecified: Secondary | ICD-10-CM | POA: Diagnosis not present

## 2017-02-03 DIAGNOSIS — Z466 Encounter for fitting and adjustment of urinary device: Secondary | ICD-10-CM | POA: Diagnosis not present

## 2017-02-03 DIAGNOSIS — M5489 Other dorsalgia: Secondary | ICD-10-CM | POA: Diagnosis not present

## 2017-02-03 DIAGNOSIS — G950 Syringomyelia and syringobulbia: Secondary | ICD-10-CM | POA: Diagnosis not present

## 2017-02-03 DIAGNOSIS — M549 Dorsalgia, unspecified: Secondary | ICD-10-CM | POA: Diagnosis not present

## 2017-02-03 DIAGNOSIS — G609 Hereditary and idiopathic neuropathy, unspecified: Secondary | ICD-10-CM | POA: Diagnosis not present

## 2017-02-03 DIAGNOSIS — Z87440 Personal history of urinary (tract) infections: Secondary | ICD-10-CM | POA: Diagnosis not present

## 2017-02-09 DIAGNOSIS — R279 Unspecified lack of coordination: Secondary | ICD-10-CM | POA: Diagnosis not present

## 2017-02-09 DIAGNOSIS — M79606 Pain in leg, unspecified: Secondary | ICD-10-CM | POA: Diagnosis not present

## 2017-02-09 DIAGNOSIS — Z743 Need for continuous supervision: Secondary | ICD-10-CM | POA: Diagnosis not present

## 2017-02-09 DIAGNOSIS — M546 Pain in thoracic spine: Secondary | ICD-10-CM | POA: Diagnosis not present

## 2017-02-09 DIAGNOSIS — M545 Low back pain: Secondary | ICD-10-CM | POA: Diagnosis not present

## 2017-02-09 DIAGNOSIS — G894 Chronic pain syndrome: Secondary | ICD-10-CM | POA: Diagnosis not present

## 2017-03-02 DIAGNOSIS — M549 Dorsalgia, unspecified: Secondary | ICD-10-CM | POA: Diagnosis not present

## 2017-03-02 DIAGNOSIS — G8929 Other chronic pain: Secondary | ICD-10-CM | POA: Diagnosis not present

## 2017-03-02 DIAGNOSIS — F419 Anxiety disorder, unspecified: Secondary | ICD-10-CM | POA: Diagnosis not present

## 2017-03-02 DIAGNOSIS — Z466 Encounter for fitting and adjustment of urinary device: Secondary | ICD-10-CM | POA: Diagnosis not present

## 2017-03-02 DIAGNOSIS — G950 Syringomyelia and syringobulbia: Secondary | ICD-10-CM | POA: Diagnosis not present

## 2017-03-02 DIAGNOSIS — G609 Hereditary and idiopathic neuropathy, unspecified: Secondary | ICD-10-CM | POA: Diagnosis not present

## 2017-03-03 DIAGNOSIS — Z466 Encounter for fitting and adjustment of urinary device: Secondary | ICD-10-CM | POA: Diagnosis not present

## 2017-03-03 DIAGNOSIS — F419 Anxiety disorder, unspecified: Secondary | ICD-10-CM | POA: Diagnosis not present

## 2017-03-03 DIAGNOSIS — G609 Hereditary and idiopathic neuropathy, unspecified: Secondary | ICD-10-CM | POA: Diagnosis not present

## 2017-03-03 DIAGNOSIS — G8929 Other chronic pain: Secondary | ICD-10-CM | POA: Diagnosis not present

## 2017-03-03 DIAGNOSIS — M549 Dorsalgia, unspecified: Secondary | ICD-10-CM | POA: Diagnosis not present

## 2017-03-03 DIAGNOSIS — G950 Syringomyelia and syringobulbia: Secondary | ICD-10-CM | POA: Diagnosis not present

## 2017-03-06 DIAGNOSIS — G8929 Other chronic pain: Secondary | ICD-10-CM | POA: Diagnosis not present

## 2017-03-06 DIAGNOSIS — F419 Anxiety disorder, unspecified: Secondary | ICD-10-CM | POA: Diagnosis not present

## 2017-03-06 DIAGNOSIS — F17200 Nicotine dependence, unspecified, uncomplicated: Secondary | ICD-10-CM | POA: Diagnosis not present

## 2017-03-06 DIAGNOSIS — M5441 Lumbago with sciatica, right side: Secondary | ICD-10-CM | POA: Diagnosis not present

## 2017-03-06 DIAGNOSIS — Z800 Family history of malignant neoplasm of digestive organs: Secondary | ICD-10-CM | POA: Diagnosis not present

## 2017-03-06 DIAGNOSIS — M791 Myalgia: Secondary | ICD-10-CM | POA: Diagnosis not present

## 2017-03-06 DIAGNOSIS — M545 Low back pain: Secondary | ICD-10-CM | POA: Diagnosis not present

## 2017-03-06 DIAGNOSIS — R52 Pain, unspecified: Secondary | ICD-10-CM | POA: Diagnosis not present

## 2017-03-06 DIAGNOSIS — Z833 Family history of diabetes mellitus: Secondary | ICD-10-CM | POA: Diagnosis not present

## 2017-03-06 DIAGNOSIS — Z79899 Other long term (current) drug therapy: Secondary | ICD-10-CM | POA: Diagnosis not present

## 2017-03-06 DIAGNOSIS — Z87442 Personal history of urinary calculi: Secondary | ICD-10-CM | POA: Diagnosis not present

## 2017-03-06 DIAGNOSIS — K219 Gastro-esophageal reflux disease without esophagitis: Secondary | ICD-10-CM | POA: Diagnosis not present

## 2017-03-06 DIAGNOSIS — N390 Urinary tract infection, site not specified: Secondary | ICD-10-CM | POA: Diagnosis not present

## 2017-03-06 DIAGNOSIS — L89892 Pressure ulcer of other site, stage 2: Secondary | ICD-10-CM | POA: Diagnosis not present

## 2017-03-06 DIAGNOSIS — T83511A Infection and inflammatory reaction due to indwelling urethral catheter, initial encounter: Secondary | ICD-10-CM | POA: Diagnosis not present

## 2017-03-06 DIAGNOSIS — G950 Syringomyelia and syringobulbia: Secondary | ICD-10-CM | POA: Diagnosis not present

## 2017-03-06 DIAGNOSIS — M5442 Lumbago with sciatica, left side: Secondary | ICD-10-CM | POA: Diagnosis not present

## 2017-03-06 DIAGNOSIS — R531 Weakness: Secondary | ICD-10-CM | POA: Diagnosis not present

## 2017-03-06 DIAGNOSIS — M546 Pain in thoracic spine: Secondary | ICD-10-CM | POA: Diagnosis not present

## 2017-03-06 DIAGNOSIS — B353 Tinea pedis: Secondary | ICD-10-CM | POA: Diagnosis not present

## 2017-03-06 DIAGNOSIS — Z8489 Family history of other specified conditions: Secondary | ICD-10-CM | POA: Diagnosis not present

## 2017-03-07 DIAGNOSIS — G950 Syringomyelia and syringobulbia: Secondary | ICD-10-CM | POA: Diagnosis not present

## 2017-03-07 DIAGNOSIS — M545 Low back pain: Secondary | ICD-10-CM | POA: Diagnosis not present

## 2017-03-07 DIAGNOSIS — N390 Urinary tract infection, site not specified: Secondary | ICD-10-CM | POA: Diagnosis not present

## 2017-03-07 DIAGNOSIS — K219 Gastro-esophageal reflux disease without esophagitis: Secondary | ICD-10-CM | POA: Diagnosis not present

## 2017-03-07 DIAGNOSIS — L89892 Pressure ulcer of other site, stage 2: Secondary | ICD-10-CM | POA: Diagnosis not present

## 2017-03-08 DIAGNOSIS — M545 Low back pain: Secondary | ICD-10-CM | POA: Diagnosis not present

## 2017-03-08 DIAGNOSIS — R279 Unspecified lack of coordination: Secondary | ICD-10-CM | POA: Diagnosis not present

## 2017-03-08 DIAGNOSIS — N390 Urinary tract infection, site not specified: Secondary | ICD-10-CM | POA: Diagnosis not present

## 2017-03-08 DIAGNOSIS — G950 Syringomyelia and syringobulbia: Secondary | ICD-10-CM | POA: Diagnosis not present

## 2017-03-08 DIAGNOSIS — Z7401 Bed confinement status: Secondary | ICD-10-CM | POA: Diagnosis not present

## 2017-03-08 DIAGNOSIS — L89892 Pressure ulcer of other site, stage 2: Secondary | ICD-10-CM | POA: Diagnosis not present

## 2017-03-08 DIAGNOSIS — K219 Gastro-esophageal reflux disease without esophagitis: Secondary | ICD-10-CM | POA: Diagnosis not present

## 2017-03-11 DIAGNOSIS — Z87442 Personal history of urinary calculi: Secondary | ICD-10-CM | POA: Diagnosis not present

## 2017-03-11 DIAGNOSIS — R1032 Left lower quadrant pain: Secondary | ICD-10-CM | POA: Diagnosis not present

## 2017-03-11 DIAGNOSIS — F419 Anxiety disorder, unspecified: Secondary | ICD-10-CM | POA: Diagnosis not present

## 2017-03-11 DIAGNOSIS — G8929 Other chronic pain: Secondary | ICD-10-CM | POA: Diagnosis not present

## 2017-03-11 DIAGNOSIS — R279 Unspecified lack of coordination: Secondary | ICD-10-CM | POA: Diagnosis not present

## 2017-03-11 DIAGNOSIS — R109 Unspecified abdominal pain: Secondary | ICD-10-CM | POA: Diagnosis not present

## 2017-03-11 DIAGNOSIS — R0789 Other chest pain: Secondary | ICD-10-CM | POA: Diagnosis not present

## 2017-03-11 DIAGNOSIS — Z79899 Other long term (current) drug therapy: Secondary | ICD-10-CM | POA: Diagnosis not present

## 2017-03-11 DIAGNOSIS — R52 Pain, unspecified: Secondary | ICD-10-CM | POA: Diagnosis not present

## 2017-03-11 DIAGNOSIS — Z7401 Bed confinement status: Secondary | ICD-10-CM | POA: Diagnosis not present

## 2017-03-11 DIAGNOSIS — F329 Major depressive disorder, single episode, unspecified: Secondary | ICD-10-CM | POA: Diagnosis not present

## 2017-03-11 DIAGNOSIS — F909 Attention-deficit hyperactivity disorder, unspecified type: Secondary | ICD-10-CM | POA: Diagnosis not present

## 2017-03-11 DIAGNOSIS — F17200 Nicotine dependence, unspecified, uncomplicated: Secondary | ICD-10-CM | POA: Diagnosis not present

## 2017-03-11 DIAGNOSIS — N200 Calculus of kidney: Secondary | ICD-10-CM | POA: Diagnosis not present

## 2017-03-11 DIAGNOSIS — M545 Low back pain: Secondary | ICD-10-CM | POA: Diagnosis not present

## 2017-03-11 DIAGNOSIS — G629 Polyneuropathy, unspecified: Secondary | ICD-10-CM | POA: Diagnosis not present

## 2017-03-19 DIAGNOSIS — G950 Syringomyelia and syringobulbia: Secondary | ICD-10-CM | POA: Insufficient documentation

## 2017-03-20 DIAGNOSIS — G8220 Paraplegia, unspecified: Secondary | ICD-10-CM | POA: Diagnosis not present

## 2017-03-20 DIAGNOSIS — I6789 Other cerebrovascular disease: Secondary | ICD-10-CM | POA: Diagnosis not present

## 2017-03-20 DIAGNOSIS — M549 Dorsalgia, unspecified: Secondary | ICD-10-CM | POA: Diagnosis not present

## 2017-03-20 DIAGNOSIS — R52 Pain, unspecified: Secondary | ICD-10-CM | POA: Diagnosis not present

## 2017-03-20 DIAGNOSIS — F329 Major depressive disorder, single episode, unspecified: Secondary | ICD-10-CM | POA: Diagnosis not present

## 2017-03-20 DIAGNOSIS — G8929 Other chronic pain: Secondary | ICD-10-CM | POA: Diagnosis not present

## 2017-03-20 DIAGNOSIS — R531 Weakness: Secondary | ICD-10-CM | POA: Diagnosis not present

## 2017-03-20 DIAGNOSIS — F17200 Nicotine dependence, unspecified, uncomplicated: Secondary | ICD-10-CM | POA: Diagnosis not present

## 2017-03-20 DIAGNOSIS — F419 Anxiety disorder, unspecified: Secondary | ICD-10-CM | POA: Diagnosis not present

## 2017-03-20 DIAGNOSIS — Z79899 Other long term (current) drug therapy: Secondary | ICD-10-CM | POA: Diagnosis not present

## 2017-03-21 ENCOUNTER — Encounter (HOSPITAL_COMMUNITY): Payer: Self-pay

## 2017-03-21 ENCOUNTER — Emergency Department (HOSPITAL_COMMUNITY)
Admission: EM | Admit: 2017-03-21 | Discharge: 2017-03-21 | Disposition: A | Discharge status: Home / Self Care | Payer: Medicare Other | Attending: Emergency Medicine | Admitting: Emergency Medicine

## 2017-03-21 DIAGNOSIS — Z87891 Personal history of nicotine dependence: Secondary | ICD-10-CM | POA: Insufficient documentation

## 2017-03-21 DIAGNOSIS — G894 Chronic pain syndrome: Secondary | ICD-10-CM | POA: Insufficient documentation

## 2017-03-21 DIAGNOSIS — R531 Weakness: Secondary | ICD-10-CM | POA: Diagnosis not present

## 2017-03-21 DIAGNOSIS — I6789 Other cerebrovascular disease: Secondary | ICD-10-CM | POA: Diagnosis not present

## 2017-03-21 DIAGNOSIS — G8220 Paraplegia, unspecified: Secondary | ICD-10-CM | POA: Diagnosis not present

## 2017-03-21 DIAGNOSIS — R279 Unspecified lack of coordination: Secondary | ICD-10-CM | POA: Diagnosis not present

## 2017-03-21 DIAGNOSIS — Z7401 Bed confinement status: Secondary | ICD-10-CM | POA: Diagnosis not present

## 2017-03-21 DIAGNOSIS — M549 Dorsalgia, unspecified: Secondary | ICD-10-CM | POA: Diagnosis present

## 2017-03-21 MED ORDER — OXYCODONE-ACETAMINOPHEN 5-325 MG PO TABS: 2.0000 | ORAL_TABLET | ORAL | 0 refills | Status: DC | PRN

## 2017-03-21 MED ORDER — ONDANSETRON 8 MG PO TBDP
8.0000 mg | ORAL_TABLET | Freq: Once | ORAL | Status: AC
  Administered 2017-03-21: 8 mg via ORAL
  Filled 2017-03-21: qty 1

## 2017-03-21 MED ORDER — HYDROMORPHONE HCL 2 MG/ML IJ SOLN
2.0000 mg | Freq: Once | INTRAMUSCULAR | Status: AC
  Administered 2017-03-21: 2 mg via INTRAMUSCULAR
  Filled 2017-03-21: qty 1

## 2017-03-21 NOTE — ED Triage Notes (Signed)
Pt was discharged from [facility] from ED, while being transferred home by EMS pt requested to come to this ED to be evaluated. Pt states he is out of his pain meds and has been since 03/12/17. Pt says he was given 2 percocet 5/325 mg at [facility] ED tonight. Pt states he is here to get a pain shot and some relief. Pt reports he cannot get his pain med until his next pain clinic appt.
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
## 2017-03-23 ENCOUNTER — Emergency Department (HOSPITAL_COMMUNITY)
Admission: EM | Admit: 2017-03-23 | Discharge: 2017-03-24 | Disposition: A | Discharge status: Home / Self Care | Payer: Medicare Other | Attending: Emergency Medicine | Admitting: Emergency Medicine

## 2017-03-23 ENCOUNTER — Encounter (HOSPITAL_COMMUNITY): Payer: Self-pay | Admitting: Emergency Medicine

## 2017-03-23 DIAGNOSIS — M549 Dorsalgia, unspecified: Secondary | ICD-10-CM | POA: Insufficient documentation

## 2017-03-23 DIAGNOSIS — M545 Low back pain: Secondary | ICD-10-CM | POA: Diagnosis not present

## 2017-03-23 DIAGNOSIS — Z87891 Personal history of nicotine dependence: Secondary | ICD-10-CM | POA: Diagnosis not present

## 2017-03-23 DIAGNOSIS — M546 Pain in thoracic spine: Secondary | ICD-10-CM | POA: Diagnosis not present

## 2017-03-23 DIAGNOSIS — Z79899 Other long term (current) drug therapy: Secondary | ICD-10-CM | POA: Diagnosis not present

## 2017-03-23 DIAGNOSIS — G8929 Other chronic pain: Secondary | ICD-10-CM | POA: Diagnosis not present

## 2017-03-23 DIAGNOSIS — Z91040 Latex allergy status: Secondary | ICD-10-CM | POA: Diagnosis not present

## 2017-03-23 DIAGNOSIS — R52 Pain, unspecified: Secondary | ICD-10-CM | POA: Diagnosis not present

## 2017-03-23 DIAGNOSIS — R531 Weakness: Secondary | ICD-10-CM | POA: Diagnosis not present

## 2017-03-23 NOTE — ED Triage Notes (Signed)
Pt c/o chronic back pain x 2 days. Pt goes to pain clinic but is out of meds. Pt told ems that if he does not get satisfied here with pain meds he is going to leave here and go to [place].. Pt did the same Saturday when he was here.

## 2017-03-24 DIAGNOSIS — Z7401 Bed confinement status: Secondary | ICD-10-CM | POA: Diagnosis not present

## 2017-03-24 DIAGNOSIS — R279 Unspecified lack of coordination: Secondary | ICD-10-CM | POA: Diagnosis not present

## 2017-03-24 DIAGNOSIS — M549 Dorsalgia, unspecified: Secondary | ICD-10-CM | POA: Diagnosis not present

## 2017-03-24 MED ORDER — PROMETHAZINE HCL 12.5 MG PO TABS: 12.5000 mg | ORAL_TABLET | Freq: Four times a day (QID) | ORAL | 0 refills | Status: DC | PRN

## 2017-03-24 MED ORDER — GABAPENTIN 600 MG PO TABS: 600.0000 mg | ORAL_TABLET | Freq: Four times a day (QID) | ORAL | 0 refills | Status: AC

## 2017-03-24 MED ORDER — PROCHLORPERAZINE MALEATE 5 MG PO TABS
10.0000 mg | ORAL_TABLET | Freq: Once | ORAL | Status: AC
  Administered 2017-03-24: 10 mg via ORAL
  Filled 2017-03-24: qty 2

## 2017-03-24 MED ORDER — HYDROMORPHONE HCL 1 MG/ML IJ SOLN
1.0000 mg | Freq: Once | INTRAMUSCULAR | Status: AC
  Administered 2017-03-24: 1 mg via INTRAMUSCULAR
  Filled 2017-03-24: qty 1

## 2017-03-24 NOTE — Discharge Instructions (Signed)
It is important that Michael Cole discuss your pain management with Dr.Hasanaj until you can be seen by the pain management center. The emergency department is not set up for pain management. You were treated tonight with an intramuscular narcotic medication, please use caution when getting about tonight.
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
## 2017-03-26 DIAGNOSIS — F900 Attention-deficit hyperactivity disorder, predominantly inattentive type: Secondary | ICD-10-CM | POA: Diagnosis not present

## 2017-03-26 DIAGNOSIS — F320 Major depressive disorder, single episode, mild: Secondary | ICD-10-CM | POA: Diagnosis not present

## 2017-03-26 DIAGNOSIS — K210 Gastro-esophageal reflux disease with esophagitis: Secondary | ICD-10-CM | POA: Diagnosis not present

## 2017-03-26 DIAGNOSIS — G8222 Paraplegia, incomplete: Secondary | ICD-10-CM | POA: Diagnosis not present

## 2017-03-26 DIAGNOSIS — G8929 Other chronic pain: Secondary | ICD-10-CM | POA: Diagnosis not present

## 2017-03-26 DIAGNOSIS — F411 Generalized anxiety disorder: Secondary | ICD-10-CM | POA: Diagnosis not present

## 2017-03-30 MED FILL — Oxycodone w/ Acetaminophen Tab 5-325 MG: ORAL | Qty: 6 | Status: AC

## 2017-04-01 DIAGNOSIS — Z466 Encounter for fitting and adjustment of urinary device: Secondary | ICD-10-CM | POA: Diagnosis not present

## 2017-04-01 DIAGNOSIS — G8929 Other chronic pain: Secondary | ICD-10-CM | POA: Diagnosis not present

## 2017-04-01 DIAGNOSIS — G609 Hereditary and idiopathic neuropathy, unspecified: Secondary | ICD-10-CM | POA: Diagnosis not present

## 2017-04-01 DIAGNOSIS — M549 Dorsalgia, unspecified: Secondary | ICD-10-CM | POA: Diagnosis not present

## 2017-04-01 DIAGNOSIS — F419 Anxiety disorder, unspecified: Secondary | ICD-10-CM | POA: Diagnosis not present

## 2017-04-01 DIAGNOSIS — G950 Syringomyelia and syringobulbia: Secondary | ICD-10-CM | POA: Diagnosis not present

## 2017-04-04 DIAGNOSIS — F419 Anxiety disorder, unspecified: Secondary | ICD-10-CM | POA: Diagnosis not present

## 2017-04-04 DIAGNOSIS — G608 Other hereditary and idiopathic neuropathies: Secondary | ICD-10-CM | POA: Diagnosis not present

## 2017-04-04 DIAGNOSIS — Z87440 Personal history of urinary (tract) infections: Secondary | ICD-10-CM | POA: Diagnosis not present

## 2017-04-04 DIAGNOSIS — M549 Dorsalgia, unspecified: Secondary | ICD-10-CM | POA: Diagnosis not present

## 2017-04-04 DIAGNOSIS — Z466 Encounter for fitting and adjustment of urinary device: Secondary | ICD-10-CM | POA: Diagnosis not present

## 2017-04-04 DIAGNOSIS — G950 Syringomyelia and syringobulbia: Secondary | ICD-10-CM | POA: Diagnosis not present

## 2017-04-04 DIAGNOSIS — G8929 Other chronic pain: Secondary | ICD-10-CM | POA: Diagnosis not present

## 2017-04-04 DIAGNOSIS — E669 Obesity, unspecified: Secondary | ICD-10-CM | POA: Diagnosis not present

## 2017-04-04 DIAGNOSIS — M5489 Other dorsalgia: Secondary | ICD-10-CM | POA: Diagnosis not present

## 2017-04-04 DIAGNOSIS — R202 Paresthesia of skin: Secondary | ICD-10-CM | POA: Diagnosis not present

## 2017-04-04 DIAGNOSIS — G609 Hereditary and idiopathic neuropathy, unspecified: Secondary | ICD-10-CM | POA: Diagnosis not present

## 2017-04-04 DIAGNOSIS — F329 Major depressive disorder, single episode, unspecified: Secondary | ICD-10-CM | POA: Diagnosis not present

## 2017-04-09 DIAGNOSIS — Z743 Need for continuous supervision: Secondary | ICD-10-CM | POA: Diagnosis not present

## 2017-04-09 DIAGNOSIS — R279 Unspecified lack of coordination: Secondary | ICD-10-CM | POA: Diagnosis not present

## 2017-04-15 ENCOUNTER — Ambulatory Visit: Payer: Medicare Other | Attending: Nurse Practitioner | Admitting: Nurse Practitioner

## 2017-04-15 ENCOUNTER — Encounter: Payer: Self-pay | Admitting: Nurse Practitioner

## 2017-04-15 VITALS — BP 122/91 | HR 96 | Temp 98.1°F | Resp 18 | Ht 72.0 in | Wt 180.0 lb

## 2017-04-15 DIAGNOSIS — G8929 Other chronic pain: Secondary | ICD-10-CM

## 2017-04-15 DIAGNOSIS — Z79899 Other long term (current) drug therapy: Secondary | ICD-10-CM | POA: Insufficient documentation

## 2017-04-15 DIAGNOSIS — M25552 Pain in left hip: Secondary | ICD-10-CM | POA: Diagnosis not present

## 2017-04-15 DIAGNOSIS — M79605 Pain in left leg: Secondary | ICD-10-CM

## 2017-04-15 DIAGNOSIS — G950 Syringomyelia and syringobulbia: Secondary | ICD-10-CM

## 2017-04-15 DIAGNOSIS — K219 Gastro-esophageal reflux disease without esophagitis: Secondary | ICD-10-CM | POA: Diagnosis not present

## 2017-04-15 DIAGNOSIS — M79604 Pain in right leg: Secondary | ICD-10-CM | POA: Diagnosis not present

## 2017-04-15 DIAGNOSIS — R6889 Other general symptoms and signs: Secondary | ICD-10-CM | POA: Diagnosis not present

## 2017-04-15 DIAGNOSIS — Z87891 Personal history of nicotine dependence: Secondary | ICD-10-CM | POA: Insufficient documentation

## 2017-04-15 DIAGNOSIS — Z79891 Long term (current) use of opiate analgesic: Secondary | ICD-10-CM | POA: Insufficient documentation

## 2017-04-15 DIAGNOSIS — M25551 Pain in right hip: Secondary | ICD-10-CM | POA: Diagnosis not present

## 2017-04-15 DIAGNOSIS — L89151 Pressure ulcer of sacral region, stage 1: Secondary | ICD-10-CM | POA: Insufficient documentation

## 2017-04-15 DIAGNOSIS — M79671 Pain in right foot: Secondary | ICD-10-CM | POA: Diagnosis not present

## 2017-04-15 DIAGNOSIS — Z91040 Latex allergy status: Secondary | ICD-10-CM | POA: Diagnosis not present

## 2017-04-15 DIAGNOSIS — R279 Unspecified lack of coordination: Secondary | ICD-10-CM | POA: Diagnosis not present

## 2017-04-15 DIAGNOSIS — F1190 Opioid use, unspecified, uncomplicated: Secondary | ICD-10-CM | POA: Insufficient documentation

## 2017-04-15 DIAGNOSIS — Z743 Need for continuous supervision: Secondary | ICD-10-CM | POA: Diagnosis not present

## 2017-04-15 DIAGNOSIS — M545 Low back pain: Secondary | ICD-10-CM

## 2017-04-15 DIAGNOSIS — M79672 Pain in left foot: Secondary | ICD-10-CM | POA: Insufficient documentation

## 2017-04-15 DIAGNOSIS — G894 Chronic pain syndrome: Secondary | ICD-10-CM | POA: Diagnosis not present

## 2017-04-15 DIAGNOSIS — F329 Major depressive disorder, single episode, unspecified: Secondary | ICD-10-CM | POA: Insufficient documentation

## 2017-04-15 DIAGNOSIS — Z7401 Bed confinement status: Secondary | ICD-10-CM | POA: Diagnosis not present

## 2017-04-15 DIAGNOSIS — M546 Pain in thoracic spine: Secondary | ICD-10-CM | POA: Insufficient documentation

## 2017-04-15 NOTE — Patient Instructions (Signed)

## 2017-04-15 NOTE — Progress Notes (Signed)
Safety precautions to be maintained throughout the outpatient stay will include: orient to surroundings, keep bed in low position, maintain call bell within reach at all times, provide assistance with transfer out of bed and ambulation.  

## 2017-04-15 NOTE — Progress Notes (Signed)
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 contacts the S1 nerve roots but do not grossly compress them.  Foraminal stenosis, right more than left with potential to irritate the L5 nerve roots is more pronounced than previously seen.  Provider: Loni Beckwith   Lumbar DG 2-3 views:  Results for orders placed during the hospital encounter of 01/21/04  DG Lumbar Spine 2-3 Views   Narrative Clinical Data: Thoracic spine cord injury. Back pain.   LUMBAR SPINE THREE VIEWS AND THORACIC SPINE   THORACIC SPINE   AP and lateral views of the thoracic spine reveal scoliosis of the thoracic spine with a convexity to the right. There is an element of kyphosis but no acute focal abnormality can be identified. There is minimal central compression of the mid to lower thoracic spine with no evidence of specific focal abnormality. No retropulsion is suggested.   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   LUMBAR SPINE (TWO TO THREE  VIEWS)  AP and lateral views of the lumbar spine reveals satisfactory alignment. No focal joint space narrowing can  be identified. There are some degenerative changes. 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  IMPRESSION  Degenerative changes are noted without acute or focal abnormality of the lumbosacral spine.    Provider: Ferd Glassing    Note: Available results from prior imaging studies were reviewed.        ROS  Cardiovascular History: No reported cardiovascular signs or symptoms such as High blood pressure, coronary artery disease, abnormal heart rate or rhythm, heart attack, blood thinner therapy or heart weakness and/or failure Pulmonary or Respiratory History: Smoking Neurological History: Incontinence:  Urinary Review of Past Neurological Studies: No results found for this or any previous visit. Psychological-Psychiatric History: Anxiousness, Depressed and Prone to panicking Gastrointestinal History: Reflux or heatburn Genitourinary History: Passing kidney stones Hematological History: No reported hematological signs or symptoms such as prolonged bleeding, low or poor functioning platelets, bruising or bleeding easily, hereditary bleeding problems, low energy levels due to low hemoglobin or being anemic Endocrine History: No reported endocrine signs or symptoms such as high or low blood sugar, rapid heart rate due to high thyroid levels, obesity or weight gain due to slow thyroid or thyroid disease Rheumatologic History: No reported rheumatological signs and symptoms such as fatigue, joint pain, tenderness, swelling, redness, heat, stiffness, decreased range of motion, with or without associated rash Musculoskeletal History: Negative for myasthenia gravis, muscular dystrophy, multiple sclerosis or malignant hyperthermia Work History: Disabled  Allergies  Mr. Whiteley is allergic to latex.  Laboratory Chemistry  Inflammation Markers Lab Results  Component Value Date   CRP 11.9 (H)  04/15/2017   ESRSEDRATE 27 04/15/2017   (CRP: Acute Phase) (ESR: Chronic Phase) Renal Function Markers Lab Results  Component Value Date   BUN 7 04/15/2017   CREATININE 0.63 (L) 04/15/2017   GFRAA 133 04/15/2017   GFRNONAA 115 04/15/2017   Hepatic Function Markers Lab Results  Component Value Date   AST 7 04/15/2017   ALT 6 04/15/2017   ALBUMIN 4.0 04/15/2017   ALKPHOS 82 04/15/2017   Electrolytes Lab Results  Component Value Date   NA 140 04/15/2017   K 3.2 (L) 04/15/2017   CL 102 04/15/2017   CALCIUM 9.5 04/15/2017   MG 2.2 04/15/2017   Neuropathy Markers Lab Results  Component Value Date   VITAMINB12 334 04/15/2017   Bone Pathology Markers Lab Results  Component Value Date   ALKPHOS 82 04/15/2017   25OHVITD1 WILL FOLLOW 04/15/2017   25OHVITD2 WILL FOLLOW 04/15/2017   25OHVITD3 WILL FOLLOW 04/15/2017   CALCIUM 9.5 04/15/2017   Coagulation Parameters Lab Results  Component Value Date   PLT 142 (L) 02/03/2010   Cardiovascular Markers Lab Results  Component Value Date   HGB 16.5 02/03/2010   HCT 46.7 02/03/2010   Note: Lab results reviewed.  Tool  Drug: Mr. Grassel  reports that he does not use drugs. Alcohol:  reports that he does not drink alcohol. Tobacco:  reports that he has quit smoking. His smoking use included Cigarettes. He started smoking about 31 years ago. He has a 20.00 pack-year smoking history. He has never used smokeless tobacco. Medical:  has a past medical history of Depression; Kidney stones; Lower back injury (2004); and Neuromuscular disorder ([place]). Family: family history includes Aneurysm in his father; Cancer in his brother; Heart failure in his mother.  Past Surgical History:  Procedure Laterality Date  . BACK SURGERY     back surgery-[facility]  . KIDNEY STONE SURGERY     Active Ambulatory Problems    Diagnosis Date Noted  .  Muscle weakness (generalized) 06/08/2011  . Hydronephrosis 09/20/2015  . 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 Long term current use of opiate analgesic 04/15/2017  . Long term prescription opiate use 04/15/2017  . Opiate use 04/15/2017  . Chronic pain syndrome 04/15/2017  . Chronic thoracic back pain (primary) (midline) 04/19/2017  . Lower extremity pain (secondary) (bilateral) (R > L) 04/19/2017  . Low back pain 04/19/2017  . Pressure ulcer of sacral region, stage 1 04/19/2017   Resolved Ambulatory Problems    Diagnosis Date Noted  . No Resolved Ambulatory Problems   Past Medical History:  Diagnosis Date  . Depression   . Kidney stones   . Lower back injury 2004  . Neuromuscular disorder ([place])    Constitutional Exam  General appearance: Well nourished, well developed, and well hydrated. In no apparent acute distress Vitals:   04/15/17 1347  BP: (!) 122/91  Pulse: 96  Resp: 18  Temp: 98.1 F (36.7 C)  TempSrc: Oral  SpO2: 99%  Weight: 180 lb (81.6 kg)  Height: 6' (1.829 m)   BMI Assessment: Estimated body mass index is 24.41 kg/m as calculated from the following:   Height as of this encounter: 6' (1.829 m).   Weight as of this encounter: 180 lb (81.6 kg).  BMI interpretation table: BMI level Category Range association with higher incidence of chronic pain  <18 kg/m2 Underweight   18.5-24.9 kg/m2 Ideal body weight   25-29.9 kg/m2 Overweight Increased incidence by 20%  30-34.9 kg/m2 Obese (Class I) Increased incidence by 68%  35-39.9 kg/m2 Severe obesity (Class II) Increased incidence by 136%  >40 kg/m2 Extreme obesity (Class III) Increased incidence by 254%   BMI Readings from Last 4 Encounters:  04/15/17 24.41 kg/m  03/23/17 25.09 kg/m  03/21/17 25.09 kg/m  11/03/11 32.73 kg/m   Wt Readings from Last 4 Encounters:  04/15/17 180 lb (81.6 kg)  03/23/17 185 lb (83.9 kg)  03/21/17 185 lb (83.9 kg)  11/03/11 238 lb (108 kg)  Psych/Mental status: Alert, oriented x 3 (person, place, & time)       Eyes: PERLA Respiratory: No evidence of acute  respiratory distress  Cervical Spine Exam  Inspection: No masses, redness, or swelling Alignment: Symmetrical Functional ROM: Unrestricted ROM      Stability: No instability detected Muscle strength & Tone: Functionally intact Sensory: Unimpaired Palpation: No palpable anomalies              Upper Extremity (UE) Exam    Side: Right upper extremity  Side: Left upper extremity  Inspection: No masses, redness, swelling, or asymmetry. No contractures  Inspection: No masses, redness, swelling, or asymmetry. No contractures  Functional ROM: Unrestricted ROM          Functional ROM: Unrestricted ROM          Muscle strength & Tone: Functionally intact  Muscle strength & Tone: Functionally intact  Sensory: Unimpaired  Sensory: Unimpaired  Palpation: No palpable anomalies              Palpation: No palpable anomalies              Specialized Test(s): Deferred         Specialized Test(s): Deferred          Thoracic Spine Exam  Inspection: Well healed scar from previous spine surgery detected Alignment: Symmetrical Functional ROM: Restricted ROM Stability: Possibly unstable Sensory: Unimpaired Muscle strength & Tone: Complains of area being tender to palpation  Lumbar Spine Exam  Inspection:  Large erythema to sacrum and coccyx area, Sacral foam dressing applied Alignment: Symmetrical Functional ROM: Limited ROM      Stability: No instability detected Muscle strength & Tone: Functionally intact Sensory: Impaired sensorium Palpation: Muscular Atrophy       Provocative Tests: Lumbar Hyperextension and rotation test: Unable to perform       Patrick's Maneuver: Unable to perform                    Gait & Posture Assessment  Ambulation: Nonfunctional Gait:bed bound Posture: Rigid   Lower Extremity Exam    Side: Right lower extremity  Side: Left lower extremity  Inspection: Atrophy  Inspection: Atrophy  Functional ROM: Restricted ROM          Functional ROM: Restricted ROM           Muscle strength & Tone: Deconditioned  Muscle strength & Tone: Deconditioned  Sensory: Impaired sensorium  Sensory: Impaired sensorium  Palpation: No palpable anomalies  Palpation: No palpable anomalies   Assessment  Primary Diagnosis & Pertinent Problem List: The primary encounter diagnosis was Chronic bilateral thoracic back pain. 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  Visit Diagnosis: 1. Chronic bilateral thoracic back pain   2. Traumatic syrinx (HCC) history   3. Pain in both lower extremities   4. Chronic low back pain, unspecified back pain laterality, with sciatica presence unspecified   5. Pressure ulcer of sacral region, stage 1   6. Chronic pain syndrome   7. Long term current use of opiate analgesic    Plan of Care  Initial treatment plan:  Please be advised that as per protocol, today's visit has been an evaluation only. We have not taken over the patient's controlled substance management.  Problem-specific plan: No problem-specific Assessment & Plan notes found for this encounter.  Ordered Lab-work, Procedure(s), Referral(s), & Consult(s): Orders Placed This Encounter  Procedures  . MR THORACIC SPINE WO CONTRAST  . Compliance Drug Analysis, Ur  . Comprehensive metabolic panel  . C-reactive protein  . Sedimentation rate  . Magnesium  . 25-Hydroxyvitamin D Lcms D2+D3  . Vitamin B12  . Ambulatory referral to Psychology  . Duoderm   Pharmacotherapy: Medications ordered:  No orders of the defined types were placed in this encounter.  Medications administered during this visit: Mr. Toran had no medications administered during this visit.   Pharmacotherapy under consideration:  Opioid  Analgesics: The patient was informed that there is no guarantee that he would be a candidate for opioid analgesics. The decision will be made following CDC guidelines. This decision will be based on the results of diagnostic studies, as well as Mr. Pohle risk profile.  Membrane stabilizer: To be determined at a later time Muscle relaxant: To be determined at a later time NSAID: To be determined at a later time Other analgesic(s): To be determined at a later time   Interventional therapies under consideration: Mr. Mcmahill was informed that there is no guarantee that he would be a candidate for interventional therapies. The decision will be based on the results of diagnostic studies, as well as Mr. Evett risk profile.  Possible procedure(s): Possible diagnostic thoracic epidural steroid injection Possible diagnostic thoracic facet injection Possible diagnostic thoracic radiofrequency ablation  Provider-requested follow-up: Return for 2nd Visit, w/ Dr. Dossie Arbour, after [facility] eval.  No future appointments.  Primary Care Physician: Neale Burly, MD Location: [facility] Note by:  Date: 04/15/2017; Time: 3:30 PM  Pain Score Disclaimer: We use the NRS-11 scale. This is a self-reported, subjective measurement of pain severity with only modest accuracy. It is used primarily to identify changes within a particular patient. It must be understood that outpatient pain scales are significantly less accurate that those used for research, where they can be applied under ideal controlled circumstances with minimal exposure to variables. In reality, the score is likely to be a combination of pain intensity and pain affect, where pain affect describes the degree of emotional arousal or changes in action readiness caused by the sensory experience of pain. Factors such as social and work situation, setting, emotional state, anxiety levels, expectation, and prior pain  experience may influence pain perception and show large inter-individual differences that may also be affected by time variables.  Patient instructions provided during this appointment: Patient Instructions    ____________________________________________________________________________________________  Appointment Policy Summary  It is our goal and responsibility to provide the medical community with assistance in the evaluation and management of patients with chronic pain. Unfortunately our resources are limited. Because we do not have an unlimited amount of time, or available appointments, we are required to closely monitor and manage their use. The following rules exist to maximize their use:  Patient's responsibilities: 1. Punctuality:  At what time should I arrive? You should be physically present in our office 30 minutes before your scheduled appointment. Your scheduled appointment is with your assigned healthcare provider. However, it takes 5-10 minutes to be "checked-in", and another 15 minutes for the nurses to do the admission. If you arrive to our office at the time you were given for your appointment, you will end up being at least 20-25 minutes late to your appointment with the provider. 2. Tardiness:  What happens if I arrive only a few minutes after my scheduled appointment time? You will need to reschedule your appointment. The cutoff is your appointment time. This is why it is so important that you arrive at least 30 minutes before that appointment. If you have an appointment scheduled for 10:00 AM and you arrive at 10:01, you will be required to reschedule your appointment.  3. Plan ahead:  Always assume that you will encounter traffic on your way in. Plan for it. If you are dependent on a driver, make sure they understand these rules and the need to arrive early. 4. Other appointments and responsibilities:  Avoid scheduling any other appointments before or after your pain  clinic appointments.  5. Be prepared:  Write down everything that you need to discuss with your healthcare provider and give this information to the admitting nurse. Write down the medications that you will need refilled. Bring your pills and bottles (even the empty ones), to all of your appointments, except for those where a procedure is scheduled. 6. No children or pets:  Find someone to take care of them. It is not appropriate to bring them in. 7. Scheduling changes:  We request "advanced notification" of any changes or cancellations. 8. Advanced notification:  Defined as a time period of more than 24 hours prior to the originally scheduled appointment. This allows for the appointment to be offered to other patients. 9. Rescheduling:  When a visit is rescheduled, it will require the cancellation of the original appointment.  For this reason they both fall within the category of "Cancellations".  10. Cancellations:  They require advanced notification. Any cancellation less than 24 hours before the  appointment will be recorded as a "No Show". 11. No Show:  Defined as an unkept appointment where the patient failed to notify or declare to the practice their intention or inability to keep the appointment.  Corrective process for repeat offenders:  1. Tardiness: Three (3) episodes of rescheduling due to late arrivals will be recorded as one (1) "No Show". 2. Cancellation or reschedule: Three (3) cancellations or rescheduling will be recorded as one (1) "No Show". 3. "No Shows": Three (3) "No Shows" within a 12 month period will result in discharge from the practice.  ____________________________________________________________________________________________  ____________________________________________________________________________________________  Pain Scale  Introduction: The pain score used by this practice is the Verbal Numerical Rating Scale (VNRS-11). This is an 11-point scale. It is  for adults and children 10 years or older. There are significant differences in how the pain score is reported, used, and applied. Forget everything you learned in the past and learn this scoring system.  General Information: The scale should reflect your current level of pain. Unless you are specifically asked for the level of your worst pain, or your average pain. If you are asked for one of these two, then it should be understood that it is over the past 24 hours.  Basic Activities of Daily Living (ADL): Personal hygiene, dressing, eating, transferring, and using restroom.  Instructions: Most patients tend to report their level of pain as a combination of two factors, their physical pain and their psychosocial pain. This last one is also known as "suffering" and it is reflection of how physical pain affects you socially and psychologically. From now on, report them separately. From this point on, when asked to report your pain level, report only your physical pain. Use the following table for reference.  Pain Clinic Pain Levels (0-5/10)  Pain Level Score  Description  No Pain 0   Mild pain 1 Nagging, annoying, but does not interfere with basic activities of daily living (ADL). Patients are able to eat, bathe, get dressed, toileting (being able to get on and off the toilet and perform personal hygiene functions), transfer (move in and out of bed or a chair without assistance), and maintain continence (able to control bladder and bowel functions). Blood pressure and heart rate are unaffected. A normal heart rate for a healthy adult ranges from 60 to 100 bpm (beats per minute).   Mild to moderate pain 2 Noticeable and distracting. Impossible to hide from other people. More frequent flare-ups. Still possible to adapt and function close to normal. It can be very annoying and may have occasional stronger flare-ups. With discipline, patients may get used to it and adapt.   Moderate pain 3 Interferes  significantly with activities of daily living (ADL). It becomes difficult to feed, bathe, get dressed, get on and off the toilet or to perform personal hygiene functions. Difficult to get in and out of bed or a chair without assistance. Very distracting. With effort, it can be ignored when deeply involved in activities.   Moderately severe pain 4 Impossible to ignore for more than a few minutes. With effort, patients may still be able to manage work or participate in some social activities. Very difficult to concentrate. Signs of autonomic nervous system discharge are evident: dilated pupils (mydriasis); mild sweating (diaphoresis); sleep interference. Heart rate becomes elevated (>115 bpm). Diastolic blood  pressure (lower number) rises above 100 mmHg. Patients find relief in laying down and not moving.   Severe pain 5 Intense and extremely unpleasant. Associated with frowning face and frequent crying. Pain overwhelms the senses.  Ability to do any activity or maintain social relationships becomes significantly limited. Conversation becomes difficult. Pacing back and forth is common, as getting into a comfortable position is nearly impossible. Pain wakes you up from deep sleep. Physical signs will be obvious: pupillary dilation; increased sweating; goosebumps; brisk reflexes; cold, clammy hands and feet; nausea, vomiting or dry heaves; loss of appetite; significant sleep disturbance with inability to fall asleep or to remain asleep. When persistent, significant weight loss is observed due to the complete loss of appetite and sleep deprivation.  Blood pressure and heart rate becomes significantly elevated. Caution: If elevated blood pressure triggers a pounding headache associated with blurred vision, then the patient should immediately seek attention at an urgent or emergency care unit, as these may be signs of an impending stroke.    Emergency Department Pain Levels (6-10/10)  Emergency Room Pain 6  Severely limiting. Requires emergency care and should not be seen or managed at an outpatient pain management facility. Communication becomes difficult and requires great effort. Assistance to reach the emergency department may be required. Facial flushing and profuse sweating along with potentially dangerous increases in heart rate and blood pressure will be evident.   Distressing pain 7 Self-care is very difficult. Assistance is required to transport, or use restroom. Assistance to reach the emergency department will be required. Tasks requiring coordination, such as bathing and getting dressed become very difficult.   Disabling pain 8 Self-care is no longer possible. At this level, pain is disabling. The individual is unable to do even the most "basic" activities such as walking, eating, bathing, dressing, transferring to a bed, or toileting. Fine motor skills are lost. It is difficult to think clearly.   Incapacitating pain 9 Pain becomes incapacitating. Thought processing is no longer possible. Difficult to remember your own name. Control of movement and coordination are lost.   The worst pain imaginable 10 At this level, most patients pass out from pain. When this level is reached, collapse of the autonomic nervous system occurs, leading to a sudden drop in blood pressure and heart rate. This in turn results in a temporary and dramatic drop in blood flow to the brain, leading to a loss of consciousness. Fainting is one of the body's self defense mechanisms. Passing out puts the brain in a calmed state and causes it to shut down for a while, in order to begin the healing process.    Summary: 1. Refer to this scale when providing [place] with your pain level. 2. Be accurate and careful when reporting your pain level. This will help with your care. 3. Over-reporting your pain level will lead to loss of credibility. 4. Even a level of 1/10 means that there is pain and will be treated at our  facility. 5. High, inaccurate reporting will be documented as "Symptom Exaggeration", leading to loss of credibility and suspicions of possible secondary gains such as obtaining more narcotics, or wanting to appear disabled, for fraudulent reasons. 6. Only pain levels of 5 or below will be seen at our facility. 7. Pain levels of 6 and above will be sent to the Emergency Department and the appointment cancelled. ____________________________________________________________________________________________

## 2017-04-19 DIAGNOSIS — M5442 Lumbago with sciatica, left side: Secondary | ICD-10-CM

## 2017-04-19 DIAGNOSIS — M5441 Lumbago with sciatica, right side: Secondary | ICD-10-CM

## 2017-04-19 DIAGNOSIS — M79605 Pain in left leg: Secondary | ICD-10-CM

## 2017-04-19 DIAGNOSIS — L89151 Pressure ulcer of sacral region, stage 1: Secondary | ICD-10-CM | POA: Insufficient documentation

## 2017-04-19 DIAGNOSIS — M79604 Pain in right leg: Secondary | ICD-10-CM

## 2017-04-19 DIAGNOSIS — G8929 Other chronic pain: Secondary | ICD-10-CM | POA: Insufficient documentation

## 2017-04-22 LAB — COMPLIANCE DRUG ANALYSIS, UR

## 2017-04-22 NOTE — Progress Notes (Signed)
NO MED NOTE: This forensic urine drug screen (UDS) test was conducted using a state-of-the-art ultra high performance liquid chromatography and mass spectrometry system (UPLC/MS-MS), the most sophisticated and accurate method available. UPLC/MS-MS is 1,000 times more precise and accurate than standard gas chromatography and mass spectrometry (GC/MS). This system can analyze 26 drug categories and 180 drug compounds.  The findings of this UDT were reported as abnormal due to inconsistencies with expected results. An expected substance was not present in the sample. Expectations were based on the medication history provided by the patient at the time of sample collection. Results may suggest one of the following possibilities:  1). Possible opioid diversion. 2). Non-compliance with instructions on how to take the medication, including increase intake of medication early in the prescription refill, possibly running out towards the end of the prescribed period. 3). A scheduling issue where the patient was unable to come in before running out of medication. 4). PRN and/or low dose use of medication. 

## 2017-04-23 LAB — COMPREHENSIVE METABOLIC PANEL
A/G RATIO: 1.2 (ref 1.2–2.2)
ALBUMIN: 4 g/dL (ref 3.5–5.5)
ALK PHOS: 82 IU/L (ref 39–117)
ALT: 6 IU/L (ref 0–44)
AST: 7 IU/L (ref 0–40)
BILIRUBIN TOTAL: 0.5 mg/dL (ref 0.0–1.2)
BUN / CREAT RATIO: 11 (ref 9–20)
BUN: 7 mg/dL (ref 6–24)
CHLORIDE: 102 mmol/L (ref 96–106)
CO2: 23 mmol/L (ref 20–29)
Calcium: 9.5 mg/dL (ref 8.7–10.2)
Creatinine, Ser: 0.63 mg/dL — ABNORMAL LOW (ref 0.76–1.27)
GFR calc Af Amer: 133 mL/min/{1.73_m2} (ref >59)
GFR calc non Af Amer: 115 mL/min/{1.73_m2} (ref >59)
Globulin, Total: 3.4 g/dL (ref 1.5–4.5)
Glucose: 87 mg/dL (ref 65–99)
Potassium: 3.2 mmol/L — ABNORMAL LOW (ref 3.5–5.2)
SODIUM: 140 mmol/L (ref 134–144)
Total Protein: 7.4 g/dL (ref 6.0–8.5)

## 2017-04-23 LAB — VITAMIN B12: VITAMIN B 12: 334 pg/mL (ref 232–1245)

## 2017-04-23 LAB — 25-HYDROXY VITAMIN D LCMS D2+D3
25-Hydroxy, Vitamin D-2: <1 ng/mL
25-Hydroxy, Vitamin D-3: 12 ng/mL
25-Hydroxy, Vitamin D: 12 ng/mL — ABNORMAL LOW

## 2017-04-23 LAB — SEDIMENTATION RATE: Sed Rate: 27 mm/hr (ref 0–30)

## 2017-04-23 LAB — MAGNESIUM: Magnesium: 2.2 mg/dL (ref 1.6–2.3)

## 2017-04-23 LAB — C-REACTIVE PROTEIN: CRP: 11.9 mg/L — AB (ref 0.0–4.9)

## 2017-04-27 DIAGNOSIS — F411 Generalized anxiety disorder: Secondary | ICD-10-CM | POA: Diagnosis not present

## 2017-04-27 DIAGNOSIS — F332 Major depressive disorder, recurrent severe without psychotic features: Secondary | ICD-10-CM | POA: Diagnosis not present

## 2017-05-05 DIAGNOSIS — F419 Anxiety disorder, unspecified: Secondary | ICD-10-CM | POA: Diagnosis not present

## 2017-05-05 DIAGNOSIS — Z466 Encounter for fitting and adjustment of urinary device: Secondary | ICD-10-CM | POA: Diagnosis not present

## 2017-05-05 DIAGNOSIS — M549 Dorsalgia, unspecified: Secondary | ICD-10-CM | POA: Diagnosis not present

## 2017-05-05 DIAGNOSIS — G8929 Other chronic pain: Secondary | ICD-10-CM | POA: Diagnosis not present

## 2017-05-05 DIAGNOSIS — G950 Syringomyelia and syringobulbia: Secondary | ICD-10-CM | POA: Diagnosis not present

## 2017-05-05 DIAGNOSIS — G609 Hereditary and idiopathic neuropathy, unspecified: Secondary | ICD-10-CM | POA: Diagnosis not present

## 2017-05-06 ENCOUNTER — Other Ambulatory Visit: Payer: Self-pay | Admitting: Nurse Practitioner

## 2017-05-06 ENCOUNTER — Encounter: Payer: Self-pay | Admitting: Nurse Practitioner

## 2017-05-06 ENCOUNTER — Other Ambulatory Visit (HOSPITAL_COMMUNITY)
Admission: AD | Admit: 2017-05-06 | Discharge: 2017-05-06 | Disposition: A | Discharge status: Home / Self Care | Payer: Medicare Other | Source: Skilled Nursing Facility | Attending: Internal Medicine | Admitting: Internal Medicine

## 2017-05-06 ENCOUNTER — Other Ambulatory Visit: Payer: Self-pay

## 2017-05-06 DIAGNOSIS — G950 Syringomyelia and syringobulbia: Secondary | ICD-10-CM | POA: Diagnosis not present

## 2017-05-06 DIAGNOSIS — F419 Anxiety disorder, unspecified: Secondary | ICD-10-CM | POA: Diagnosis not present

## 2017-05-06 DIAGNOSIS — Z466 Encounter for fitting and adjustment of urinary device: Secondary | ICD-10-CM | POA: Diagnosis not present

## 2017-05-06 DIAGNOSIS — N390 Urinary tract infection, site not specified: Secondary | ICD-10-CM | POA: Insufficient documentation

## 2017-05-06 DIAGNOSIS — E559 Vitamin D deficiency, unspecified: Secondary | ICD-10-CM | POA: Insufficient documentation

## 2017-05-06 DIAGNOSIS — G609 Hereditary and idiopathic neuropathy, unspecified: Secondary | ICD-10-CM | POA: Diagnosis not present

## 2017-05-06 DIAGNOSIS — G8929 Other chronic pain: Secondary | ICD-10-CM | POA: Diagnosis not present

## 2017-05-06 DIAGNOSIS — M549 Dorsalgia, unspecified: Secondary | ICD-10-CM | POA: Diagnosis not present

## 2017-05-06 LAB — URINALYSIS, ROUTINE W REFLEX MICROSCOPIC
Bilirubin Urine: NEGATIVE
GLUCOSE, UA: NEGATIVE mg/dL
Ketones, ur: NEGATIVE mg/dL
NITRITE: POSITIVE — AB
PH: 5 (ref 5.0–8.0)
Protein, ur: 30 mg/dL — AB
Specific Gravity, Urine: 1.015 (ref 1.005–1.030)
Squamous Epithelial / LPF: NONE SEEN

## 2017-05-06 MED ORDER — VITAMIN D (ERGOCALCIFEROL) 1.25 MG (50000 UNIT) PO CAPS: ORAL_CAPSULE | ORAL | 0 refills | Status: DC

## 2017-05-07 ENCOUNTER — Telehealth: Payer: Self-pay

## 2017-05-07 DIAGNOSIS — F419 Anxiety disorder, unspecified: Secondary | ICD-10-CM | POA: Diagnosis not present

## 2017-05-07 DIAGNOSIS — G950 Syringomyelia and syringobulbia: Secondary | ICD-10-CM | POA: Diagnosis not present

## 2017-05-07 DIAGNOSIS — Z466 Encounter for fitting and adjustment of urinary device: Secondary | ICD-10-CM | POA: Diagnosis not present

## 2017-05-07 DIAGNOSIS — M549 Dorsalgia, unspecified: Secondary | ICD-10-CM | POA: Diagnosis not present

## 2017-05-07 DIAGNOSIS — G8929 Other chronic pain: Secondary | ICD-10-CM | POA: Diagnosis not present

## 2017-05-07 DIAGNOSIS — G609 Hereditary and idiopathic neuropathy, unspecified: Secondary | ICD-10-CM | POA: Diagnosis not present

## 2017-05-07 NOTE — Telephone Encounter (Signed)
Informed patient that he had Vitamin D to be picked up from his pharmacy.  INstructed to take it  As prescribed for 12 weeks.

## 2017-05-08 LAB — URINE CULTURE

## 2017-05-18 DIAGNOSIS — M545 Low back pain: Secondary | ICD-10-CM | POA: Diagnosis not present

## 2017-05-18 DIAGNOSIS — F320 Major depressive disorder, single episode, mild: Secondary | ICD-10-CM | POA: Diagnosis not present

## 2017-05-18 DIAGNOSIS — G8929 Other chronic pain: Secondary | ICD-10-CM | POA: Diagnosis not present

## 2017-05-18 DIAGNOSIS — K210 Gastro-esophageal reflux disease with esophagitis: Secondary | ICD-10-CM | POA: Diagnosis not present

## 2017-05-18 DIAGNOSIS — G8222 Paraplegia, incomplete: Secondary | ICD-10-CM | POA: Diagnosis not present

## 2017-05-18 DIAGNOSIS — L89153 Pressure ulcer of sacral region, stage 3: Secondary | ICD-10-CM | POA: Diagnosis not present

## 2017-05-18 DIAGNOSIS — F900 Attention-deficit hyperactivity disorder, predominantly inattentive type: Secondary | ICD-10-CM | POA: Diagnosis not present

## 2017-05-18 DIAGNOSIS — F411 Generalized anxiety disorder: Secondary | ICD-10-CM | POA: Diagnosis not present
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
## 2017-05-26 ENCOUNTER — Ambulatory Visit: Payer: Medicare Other | Attending: Pain Medicine | Admitting: Pain Medicine

## 2017-05-26 ENCOUNTER — Encounter: Payer: Self-pay | Admitting: Pain Medicine

## 2017-05-26 VITALS — BP 125/79 | HR 94 | Temp 98.0°F | Resp 16 | Ht 72.0 in | Wt 183.0 lb

## 2017-05-26 DIAGNOSIS — M5124 Other intervertebral disc displacement, thoracic region: Secondary | ICD-10-CM | POA: Diagnosis not present

## 2017-05-26 DIAGNOSIS — M5146 Schmorl's nodes, lumbar region: Secondary | ICD-10-CM

## 2017-05-26 DIAGNOSIS — R252 Cramp and spasm: Secondary | ICD-10-CM

## 2017-05-26 DIAGNOSIS — M79605 Pain in left leg: Secondary | ICD-10-CM

## 2017-05-26 DIAGNOSIS — Z8781 Personal history of (healed) traumatic fracture: Secondary | ICD-10-CM | POA: Insufficient documentation

## 2017-05-26 DIAGNOSIS — F1390 Sedative, hypnotic, or anxiolytic use, unspecified, uncomplicated: Secondary | ICD-10-CM | POA: Insufficient documentation

## 2017-05-26 DIAGNOSIS — M5441 Lumbago with sciatica, right side: Secondary | ICD-10-CM

## 2017-05-26 DIAGNOSIS — Z79891 Long term (current) use of opiate analgesic: Secondary | ICD-10-CM | POA: Diagnosis not present

## 2017-05-26 DIAGNOSIS — G894 Chronic pain syndrome: Secondary | ICD-10-CM | POA: Diagnosis not present

## 2017-05-26 DIAGNOSIS — Z87442 Personal history of urinary calculi: Secondary | ICD-10-CM | POA: Diagnosis not present

## 2017-05-26 DIAGNOSIS — L89151 Pressure ulcer of sacral region, stage 1: Secondary | ICD-10-CM

## 2017-05-26 DIAGNOSIS — S24103A Unspecified injury at T7-T10 level of thoracic spinal cord, initial encounter: Secondary | ICD-10-CM | POA: Insufficient documentation

## 2017-05-26 DIAGNOSIS — M5137 Other intervertebral disc degeneration, lumbosacral region: Secondary | ICD-10-CM | POA: Insufficient documentation

## 2017-05-26 DIAGNOSIS — M79604 Pain in right leg: Secondary | ICD-10-CM

## 2017-05-26 DIAGNOSIS — G8220 Paraplegia, unspecified: Secondary | ICD-10-CM

## 2017-05-26 DIAGNOSIS — G4701 Insomnia due to medical condition: Secondary | ICD-10-CM

## 2017-05-26 DIAGNOSIS — S24103S Unspecified injury at T7-T10 level of thoracic spinal cord, sequela: Secondary | ICD-10-CM

## 2017-05-26 DIAGNOSIS — G9589 Other specified diseases of spinal cord: Secondary | ICD-10-CM | POA: Diagnosis not present

## 2017-05-26 DIAGNOSIS — Z79899 Other long term (current) drug therapy: Secondary | ICD-10-CM

## 2017-05-26 DIAGNOSIS — M5442 Lumbago with sciatica, left side: Secondary | ICD-10-CM | POA: Diagnosis not present

## 2017-05-26 DIAGNOSIS — M792 Neuralgia and neuritis, unspecified: Secondary | ICD-10-CM | POA: Diagnosis not present

## 2017-05-26 DIAGNOSIS — M62838 Other muscle spasm: Secondary | ICD-10-CM

## 2017-05-26 DIAGNOSIS — G8929 Other chronic pain: Secondary | ICD-10-CM

## 2017-05-26 DIAGNOSIS — R209 Unspecified disturbances of skin sensation: Secondary | ICD-10-CM

## 2017-05-26 DIAGNOSIS — M4154 Other secondary scoliosis, thoracic region: Secondary | ICD-10-CM

## 2017-05-26 DIAGNOSIS — F1190 Opioid use, unspecified, uncomplicated: Secondary | ICD-10-CM

## 2017-05-26 DIAGNOSIS — M5116 Intervertebral disc disorders with radiculopathy, lumbar region: Secondary | ICD-10-CM | POA: Diagnosis not present

## 2017-05-26 DIAGNOSIS — R937 Abnormal findings on diagnostic imaging of other parts of musculoskeletal system: Secondary | ICD-10-CM | POA: Insufficient documentation

## 2017-05-26 DIAGNOSIS — M5134 Other intervertebral disc degeneration, thoracic region: Secondary | ICD-10-CM | POA: Diagnosis not present

## 2017-05-26 DIAGNOSIS — E876 Hypokalemia: Secondary | ICD-10-CM | POA: Diagnosis not present

## 2017-05-26 DIAGNOSIS — M791 Myalgia: Secondary | ICD-10-CM | POA: Diagnosis not present

## 2017-05-26 DIAGNOSIS — F329 Major depressive disorder, single episode, unspecified: Secondary | ICD-10-CM | POA: Insufficient documentation

## 2017-05-26 DIAGNOSIS — M50223 Other cervical disc displacement at C6-C7 level: Secondary | ICD-10-CM | POA: Insufficient documentation

## 2017-05-26 DIAGNOSIS — M4184 Other forms of scoliosis, thoracic region: Secondary | ICD-10-CM | POA: Insufficient documentation

## 2017-05-26 DIAGNOSIS — R279 Unspecified lack of coordination: Secondary | ICD-10-CM | POA: Diagnosis not present

## 2017-05-26 DIAGNOSIS — F17210 Nicotine dependence, cigarettes, uncomplicated: Secondary | ICD-10-CM | POA: Insufficient documentation

## 2017-05-26 DIAGNOSIS — Z7401 Bed confinement status: Secondary | ICD-10-CM | POA: Diagnosis not present

## 2017-05-26 DIAGNOSIS — E559 Vitamin D deficiency, unspecified: Secondary | ICD-10-CM | POA: Diagnosis not present

## 2017-05-26 DIAGNOSIS — G950 Syringomyelia and syringobulbia: Secondary | ICD-10-CM

## 2017-05-26 DIAGNOSIS — Z743 Need for continuous supervision: Secondary | ICD-10-CM | POA: Diagnosis not present

## 2017-05-26 DIAGNOSIS — M546 Pain in thoracic spine: Secondary | ICD-10-CM | POA: Diagnosis not present

## 2017-05-26 DIAGNOSIS — M7918 Myalgia, other site: Secondary | ICD-10-CM

## 2017-05-26 DIAGNOSIS — R6889 Other general symptoms and signs: Secondary | ICD-10-CM | POA: Diagnosis not present

## 2017-05-26 DIAGNOSIS — M5136 Other intervertebral disc degeneration, lumbar region: Secondary | ICD-10-CM | POA: Diagnosis not present

## 2017-05-26 DIAGNOSIS — R7982 Elevated C-reactive protein (CRP): Secondary | ICD-10-CM

## 2017-05-26 MED ORDER — BUPRENORPHINE 7.5 MCG/HR TD PTWK: 7.5000 ug/h | MEDICATED_PATCH | TRANSDERMAL | 0 refills | Status: DC

## 2017-05-26 MED ORDER — CYANOCOBALAMIN 2000 MCG PO TABS: 2000.0000 ug | ORAL_TABLET | Freq: Every day | ORAL | 5 refills | Status: AC

## 2017-05-26 MED ORDER — CHOLECALCIFEROL 125 MCG (5000 UT) PO CAPS: 5000.0000 [IU] | ORAL_CAPSULE | Freq: Every day | ORAL | 0 refills | Status: DC

## 2017-05-26 MED ORDER — MELATONIN 10 MG PO CAPS: 20.0000 mg | ORAL_CAPSULE | Freq: Every evening | ORAL | 5 refills | Status: DC | PRN

## 2017-05-26 MED ORDER — CALCIUM PLUS D3 ABSORBABLE 600-2500 MG-UNIT PO CAPS: 1.0000 | ORAL_CAPSULE | Freq: Every day | ORAL | 0 refills | Status: DC

## 2017-05-26 NOTE — Patient Instructions (Addendum)
____________________________________________________________________________________________  Pain Scale  Introduction: The pain score used by this practice is the Verbal Numerical Rating Scale (VNRS-11). This is an 11-point scale. It is for adults and children 10 years or older. There are significant differences in how the pain score is reported, used, and applied. Forget everything you learned in the past and learn this scoring system.  General Information: The scale should reflect your current level of pain. Unless you are specifically asked for the level of your worst pain, or your average pain. If you are asked for one of these two, then it should be understood that it is over the past 24 hours.  Basic Activities of Daily Living (ADL): Personal hygiene, dressing, eating, transferring, and using restroom.  Instructions: Most patients tend to report their level of pain as a combination of two factors, their physical pain and their psychosocial pain. This last one is also known as "suffering" and it is reflection of how physical pain affects you socially and psychologically. From now on, report them separately. From this point on, when asked to report your pain level, report only your physical pain. Use the following table for reference.  Pain Clinic Pain Levels (0-5/10)  Pain Level Score  Description  No Pain 0   Mild pain 1 Nagging, annoying, but does not interfere with basic activities of daily living (ADL). Patients are able to eat, bathe, get dressed, toileting (being able to get on and off the toilet and perform personal hygiene functions), transfer (move in and out of bed or a chair without assistance), and maintain continence (able to control bladder and bowel functions). Blood pressure and heart rate are unaffected. A normal heart rate for a healthy adult ranges from 60 to 100 bpm (beats per minute).   Mild to moderate pain 2 Noticeable and distracting. Impossible to hide from other  people. More frequent flare-ups. Still possible to adapt and function close to normal. It can be very annoying and may have occasional stronger flare-ups. With discipline, patients may get used to it and adapt.   Moderate pain 3 Interferes significantly with activities of daily living (ADL). It becomes difficult to feed, bathe, get dressed, get on and off the toilet or to perform personal hygiene functions. Difficult to get in and out of bed or a chair without assistance. Very distracting. With effort, it can be ignored when deeply involved in activities.   Moderately severe pain 4 Impossible to ignore for more than a few minutes. With effort, patients may still be able to manage work or participate in some social activities. Very difficult to concentrate. Signs of autonomic nervous system discharge are evident: dilated pupils (mydriasis); mild sweating (diaphoresis); sleep interference. Heart rate becomes elevated (>115 bpm). Diastolic blood pressure (lower number) rises above 100 mmHg. Patients find relief in laying down and not moving.   Severe pain 5 Intense and extremely unpleasant. Associated with frowning face and frequent crying. Pain overwhelms the senses.  Ability to do any activity or maintain social relationships becomes significantly limited. Conversation becomes difficult. Pacing back and forth is common, as getting into a comfortable position is nearly impossible. Pain wakes you up from deep sleep. Physical signs will be obvious: pupillary dilation; increased sweating; goosebumps; brisk reflexes; cold, clammy hands and feet; nausea, vomiting or dry heaves; loss of appetite; significant sleep disturbance with inability to fall asleep or to remain asleep. When persistent, significant weight loss is observed due to the complete loss of appetite and sleep deprivation.  Blood   pressure and heart rate becomes significantly elevated. Caution: If elevated blood pressure triggers a pounding headache  associated with blurred vision, then the patient should immediately seek attention at an urgent or emergency care unit, as these may be signs of an impending stroke.    Emergency Department Pain Levels (6-10/10)  Emergency Room Pain 6 Severely limiting. Requires emergency care and should not be seen or managed at an outpatient pain management facility. Communication becomes difficult and requires great effort. Assistance to reach the emergency department may be required. Facial flushing and profuse sweating along with potentially dangerous increases in heart rate and blood pressure will be evident.   Distressing pain 7 Self-care is very difficult. Assistance is required to transport, or use restroom. Assistance to reach the emergency department will be required. Tasks requiring coordination, such as bathing and getting dressed become very difficult.   Disabling pain 8 Self-care is no longer possible. At this level, pain is disabling. The individual is unable to do even the most "basic" activities such as walking, eating, bathing, dressing, transferring to a bed, or toileting. Fine motor skills are lost. It is difficult to think clearly.   Incapacitating pain 9 Pain becomes incapacitating. Thought processing is no longer possible. Difficult to remember your own name. Control of movement and coordination are lost.   The worst pain imaginable 10 At this level, most patients pass out from pain. When this level is reached, collapse of the autonomic nervous system occurs, leading to a sudden drop in blood pressure and heart rate. This in turn results in a temporary and dramatic drop in blood flow to the brain, leading to a loss of consciousness. Fainting is one of the body's self defense mechanisms. Passing out puts the brain in a calmed state and causes it to shut down for a while, in order to begin the healing process.    Summary: 1. Refer to this scale when providing [place] with your pain level. 2. Be  accurate and careful when reporting your pain level. This will help with your care. 3. Over-reporting your pain level will lead to loss of credibility. 4. Even a level of 1/10 means that there is pain and will be treated at our facility. 5. High, inaccurate reporting will be documented as "Symptom Exaggeration", leading to loss of credibility and suspicions of possible secondary gains such as obtaining more narcotics, or wanting to appear disabled, for fraudulent reasons. 6. Only pain levels of 5 or below will be seen at our facility. 7. Pain levels of 6 and above will be sent to the Emergency Department and the appointment cancelled. ____________________________________________________________________________________________  Pain Management Discharge Instructions  General Discharge Instructions :  If you need to reach your doctor call: Monday-Friday 8:00 am - 4:00 pm at [phone] or toll free [phone].  After clinic hours [phone] to have operator reach doctor.  Bring all of your medication bottles to all your appointments in the pain clinic.  To cancel or reschedule your appointment with Pain Management please remember to call 24 hours in advance to avoid a fee.  Refer to the educational materials which you have been given on: General Risks, I had my Procedure. Discharge Instructions, Post Sedation.  Post Procedure Instructions:  The drugs you were given will stay in your system until tomorrow, so for the next 24 hours you should not drive, make any legal decisions or drink any alcoholic beverages.  You may eat anything you prefer, but it is better to start with liquids  then soups and crackers, and gradually work up to solid foods.  Please notify your doctor immediately if you have any unusual bleeding, trouble breathing or pain that is not related to your normal pain.  Depending on the type of procedure that was done, some parts of your body may feel week and/or numb.   This usually clears up by tonight or the next day.  Walk with the use of an assistive device or accompanied by an adult for the 24 hours.  You may use ice on the affected area for the first 24 hours.  Put ice in a Ziploc bag and cover with a towel and place against area 15 minutes on 15 minutes off.  You may switch to heat after 24 hours. Intrathecal Pain Pump Information What is the intrathecal pain pump? An intrathecal pain pump is a pump that sends medicine into your spinal canal through a thin tube (catheter). You may need an intrathecal pain pump if you have long-term pain that cannot be managed in other ways. In some cases, these pumps can be used to deliver medicine that treats long-term muscle spasms (spasticity). If you are using the pump for only a short time, the pump may be outside of your body. For long-term use, the pump and the catheter are implanted inside your body. The pump is about the size of a hockey puck. It is usually placed under the skin of the abdomen. The catheter will run under your skin from your abdomen to your back. Your health care provider will program the pump to deliver pain medicine into your spinal canal continuously or at certain intervals. You may have a handheld device that allows you to give yourself a dose of pain medicine when needed. The pump runs on a battery. The battery will last 7-10 years, and then the pump will need to be replaced. What are the benefits? Opioid pain medicines are strong medicinesthat are oftenused to control long-term and severe pain. When these medicines are placed in the fluid space (intrathecal space) that surrounds your spinal cord and brain, the medicine will block the pain signals that come from your body. This type of pump may be an option if you have long-term pain that cannot be managed with opioids that are taken by mouth or by injection. The pump may allow you to have better control of your pain with less medicine. What are  the risks? You will need to have a surgical procedure to place the catheter and the pain pump. This surgery has some risks. There are also risks to long-term use of opioids. Before you can have the pump placed, you will need to go through a pain control trial. Medicine will be placed into your intrathecal space to see if itcontrols your pain and to see how well you tolerate the medicine. Risks of pain pump implantation and use include:  Infection.  Bleeding.  Failure of the pump or the catheter.  Leaking of fluid from your spinal canal (cerebrospinal fluid leak).  A growth around the tip of the catheter inside your spinal canal (granuloma). This can put pressure on your spine.  A need to remove the pump if problems develop.  A need to replace the pump after 7-10 years.  Risks of long-term opioid use include:  A breathing problem that prevents you from getting enough oxygen (respiratory depression).  Physical and psychological dependence.  Constipation or difficulty passing urine.  Nausea and vomiting.  Mental changes, including feeling high, dizzy,  drowsy, confused, depressed, or anxious.  How do I use the pump? Your health care provider will decide what type of pump is best for you.If you have a pump that gives a dose of medicine on demand, you may have a handheld device to trigger the release of your medicine. You will need to see your health care provider often to make sure the pump is programmed correctly for your pain. This programming may need to be changed over time. You will also need to have the pump resupplied with medicine. Your health care provider may do this at your routine visits. You should also know that:  Electronic devices or scans will not damage your pump.  You will need a device identification card to pass through security gates.  Your device will have an alarm to warn you of a malfunction or low battery power. The alarm will sound if you need a  medicine refill. Contact your health care provider if your alarm sounds.  When should I seek medical care? Call your health care provider right away if:  You have severe constipation or trouble passing urine.  You have a severe headache or a headache that does not go away after 2 days.  You have redness, swelling, or pain near your pump site.  Your pain is not controlled.  When should I seek immediate medical care? Seek emergency care or call your local emergency services (911 in the [place] if:  You have trouble breathing.  You have passed out or had a seizure.  You suddenly have severe back pain, leg weakness, or loss of bladder or bowel control (incontinence).  You feel very dizzy, confused, or drowsy.  This information is not intended to replace advice given to you by your health care provider. Make sure you discuss any questions you have with your health care provider. Document Released: 12/31/2008 Document Revised: 08/10/2016 Document Reviewed: 05/06/2015 Elsevier Interactive Patient Education  2017 [place].  Intrathecal Pain Pump Implantation, Care After Refer to this sheet in the next few weeks. These instructions provide you with information about caring for yourself after your procedure. Your health care provider may also give you more specific instructions. Your treatment has been planned according to current medical practices, but problems sometimes occur. Call your health care provider if you have any problems or questions after your procedure. What can I expect after the procedure? After the procedure, it is common to have:  Pain in your back and abdomen.  A slight headache.  A reaction to your pain medicine that may include itchy skin, drowsiness, and nausea.  Follow these instructions at home: Incision care   Follow instructions from your health care provider about how to take care of your incisions. Make sure you: ? Wash your hands with soap and water  before you change your bandage (dressing). If soap and water are not available, use hand sanitizer. ? Change your dressing as told by your health care provider. ? Leave stitches (sutures), skin glue, or adhesive strips in place. These skin closures may need to be in place for 2 weeks or longer. If adhesive strip edges start to loosen and curl up, you may trim the loose edges. Do not remove adhesive strips completely unless your health care provider tells you to do that.  Check your incision area every day for signs of infection. Check for: ? More redness, swelling, or pain. ? More fluid or blood. ? Warmth. ? Pus or a bad smell. Medicines  Take  over-the-counter and prescription medicines only as told by your health care provider.  Take your antibiotic medicine as told by your health care provider. Do not stop taking the antibiotic even if you start to feel better. Bathing  Do not take baths, swim, or use a hot tub until your health care provider approves. Ask your health care provider if you can take showers. You may only be allowed to take sponge baths for bathing.  Keep the dressing dry until your health care provider says it can be removed. Driving  Do not drive for 2 weeks or until your health care provider says it is okay.  Do not drive or operate heavy machinery while on prescription pain medicine. Activity  Limit your movement and activities as told by your health care provider. You may be told to: ? Avoid sleeping on your belly. ? Avoid bending or stretching. ? Avoid raising your arms over your head. ? Avoid lifting anything that is heavier than 5 lb (2.3 kg). ? Avoid doing work around the house, such as loading the dishwasher, vacuuming, or mowing the lawn.  Return to your normal activities as told by your health care provider. Ask your health care provider what activities are safe for you. General instructions  Do not use any tobacco products, such as cigarettes, chewing  tobacco, and e-cigarettes. If you need help quitting, ask your health care provider.  Limit alcohol intake to no more than 1 drink per day for nonpregnant women and 2 drinks per day for men. One drink equals 12 oz of beer, 5 oz of wine, or 1 oz of hard liquor.  Do not use recreational drugs.  Keep all follow-up visits as told by your health care provider. This is important. Contact a health care provider if:  You havemore redness, swelling, or pain around your incision.  You have more fluid or blood coming from your incision.  Your incision feels warm to the touch.  You have pus or a bad smell coming from your incision.  You vomit.  You have a headache for more than 2 days.  You feel confused, anxious, dizzy, or depressed.  You have trouble urinating or having a bowel movement.  Your pain is not being relieved by the pump.  You have a fever.  The alarm on your pump starts to beep. Get help right away if:  You have trouble breathing.  You have a seizure or you pass out.  You have sudden back pain or weakness in your legs.  You lose control over urination or bowel movements (have incontinence). This information is not intended to replace advice given to you by your health care provider. Make sure you discuss any questions you have with your health care provider. Document Released: 12/31/2008 Document Revised: 02/20/2016 Document Reviewed: 05/06/2015 Elsevier Interactive Patient Education  2018 [employer].

## 2017-05-26 NOTE — Progress Notes (Signed)
Safety precautions to be maintained throughout the outpatient stay will include: orient to surroundings, keep bed in low position, maintain call bell within reach at all times, provide assistance with transfer out of bed and ambulation.  

## 2017-05-27 ENCOUNTER — Telehealth: Payer: Self-pay

## 2017-05-27 ENCOUNTER — Telehealth: Payer: Self-pay | Admitting: Pain Medicine

## 2017-05-27 NOTE — Telephone Encounter (Signed)
Called pharm to get clarification. Also called pt and she did not sure. Clarification given to Dr Dossie Arbour to send in new script.

## 2017-05-27 NOTE — Telephone Encounter (Signed)
Talked with wife about Dr Dossie Arbour and he states that he will not write a prescription for Fent patches due to his history and his current medication. Told wife that she needed to talk to the insurance company and pharm to get [place] a PA. Fax number given

## 2017-05-27 NOTE — Telephone Encounter (Signed)
Wife called asking to speak with nurse, about meds, pharmacy faxed some info on what they needed. Patient is in a lot of pain and has no medications. Please call asap

## 2017-05-29 DIAGNOSIS — G609 Hereditary and idiopathic neuropathy, unspecified: Secondary | ICD-10-CM | POA: Diagnosis not present

## 2017-05-29 DIAGNOSIS — Z466 Encounter for fitting and adjustment of urinary device: Secondary | ICD-10-CM | POA: Diagnosis not present

## 2017-05-29 DIAGNOSIS — M549 Dorsalgia, unspecified: Secondary | ICD-10-CM | POA: Diagnosis not present

## 2017-05-29 DIAGNOSIS — G950 Syringomyelia and syringobulbia: Secondary | ICD-10-CM | POA: Diagnosis not present

## 2017-05-29 DIAGNOSIS — F419 Anxiety disorder, unspecified: Secondary | ICD-10-CM | POA: Diagnosis not present

## 2017-05-29 DIAGNOSIS — G8929 Other chronic pain: Secondary | ICD-10-CM | POA: Diagnosis not present

## 2017-06-02 DIAGNOSIS — G950 Syringomyelia and syringobulbia: Secondary | ICD-10-CM | POA: Diagnosis not present

## 2017-06-02 DIAGNOSIS — M549 Dorsalgia, unspecified: Secondary | ICD-10-CM | POA: Diagnosis not present

## 2017-06-02 DIAGNOSIS — F419 Anxiety disorder, unspecified: Secondary | ICD-10-CM | POA: Diagnosis not present

## 2017-06-02 DIAGNOSIS — Z466 Encounter for fitting and adjustment of urinary device: Secondary | ICD-10-CM | POA: Diagnosis not present

## 2017-06-02 DIAGNOSIS — G609 Hereditary and idiopathic neuropathy, unspecified: Secondary | ICD-10-CM | POA: Diagnosis not present

## 2017-06-02 DIAGNOSIS — G8929 Other chronic pain: Secondary | ICD-10-CM | POA: Diagnosis not present

## 2017-06-03 DIAGNOSIS — M549 Dorsalgia, unspecified: Secondary | ICD-10-CM | POA: Diagnosis not present

## 2017-06-03 DIAGNOSIS — G608 Other hereditary and idiopathic neuropathies: Secondary | ICD-10-CM | POA: Diagnosis not present

## 2017-06-03 DIAGNOSIS — R202 Paresthesia of skin: Secondary | ICD-10-CM | POA: Diagnosis not present

## 2017-06-03 DIAGNOSIS — F329 Major depressive disorder, single episode, unspecified: Secondary | ICD-10-CM | POA: Diagnosis not present

## 2017-06-03 DIAGNOSIS — M545 Low back pain: Secondary | ICD-10-CM | POA: Diagnosis not present

## 2017-06-03 DIAGNOSIS — G8929 Other chronic pain: Secondary | ICD-10-CM | POA: Diagnosis not present

## 2017-06-03 DIAGNOSIS — G609 Hereditary and idiopathic neuropathy, unspecified: Secondary | ICD-10-CM | POA: Diagnosis not present

## 2017-06-03 DIAGNOSIS — Z466 Encounter for fitting and adjustment of urinary device: Secondary | ICD-10-CM | POA: Diagnosis not present

## 2017-06-03 DIAGNOSIS — F419 Anxiety disorder, unspecified: Secondary | ICD-10-CM | POA: Diagnosis not present

## 2017-06-03 DIAGNOSIS — G950 Syringomyelia and syringobulbia: Secondary | ICD-10-CM | POA: Diagnosis not present

## 2017-06-03 DIAGNOSIS — E669 Obesity, unspecified: Secondary | ICD-10-CM | POA: Diagnosis not present

## 2017-06-04 DIAGNOSIS — G8929 Other chronic pain: Secondary | ICD-10-CM | POA: Diagnosis not present

## 2017-06-04 DIAGNOSIS — G609 Hereditary and idiopathic neuropathy, unspecified: Secondary | ICD-10-CM | POA: Diagnosis not present

## 2017-06-04 DIAGNOSIS — F419 Anxiety disorder, unspecified: Secondary | ICD-10-CM | POA: Diagnosis not present

## 2017-06-04 DIAGNOSIS — M549 Dorsalgia, unspecified: Secondary | ICD-10-CM | POA: Diagnosis not present

## 2017-06-04 DIAGNOSIS — G950 Syringomyelia and syringobulbia: Secondary | ICD-10-CM | POA: Diagnosis not present

## 2017-06-04 DIAGNOSIS — Z466 Encounter for fitting and adjustment of urinary device: Secondary | ICD-10-CM | POA: Diagnosis not present

## 2017-06-09 DIAGNOSIS — Z79899 Other long term (current) drug therapy: Secondary | ICD-10-CM | POA: Diagnosis not present

## 2017-06-09 DIAGNOSIS — M5136 Other intervertebral disc degeneration, lumbar region: Secondary | ICD-10-CM | POA: Diagnosis not present

## 2017-06-09 DIAGNOSIS — M5134 Other intervertebral disc degeneration, thoracic region: Secondary | ICD-10-CM | POA: Diagnosis not present

## 2017-06-09 DIAGNOSIS — M7580 Other shoulder lesions, unspecified shoulder: Secondary | ICD-10-CM | POA: Diagnosis not present

## 2017-06-09 DIAGNOSIS — M4800 Spinal stenosis, site unspecified: Secondary | ICD-10-CM | POA: Diagnosis not present

## 2017-06-10 DIAGNOSIS — Z466 Encounter for fitting and adjustment of urinary device: Secondary | ICD-10-CM | POA: Diagnosis not present

## 2017-06-10 DIAGNOSIS — G609 Hereditary and idiopathic neuropathy, unspecified: Secondary | ICD-10-CM | POA: Diagnosis not present

## 2017-06-10 DIAGNOSIS — G950 Syringomyelia and syringobulbia: Secondary | ICD-10-CM | POA: Diagnosis not present

## 2017-06-10 DIAGNOSIS — M549 Dorsalgia, unspecified: Secondary | ICD-10-CM | POA: Diagnosis not present

## 2017-06-10 DIAGNOSIS — G8929 Other chronic pain: Secondary | ICD-10-CM | POA: Diagnosis not present

## 2017-06-10 DIAGNOSIS — F419 Anxiety disorder, unspecified: Secondary | ICD-10-CM | POA: Diagnosis not present

## 2017-06-20 DIAGNOSIS — D696 Thrombocytopenia, unspecified: Secondary | ICD-10-CM | POA: Insufficient documentation

## 2017-06-20 DIAGNOSIS — Z79899 Other long term (current) drug therapy: Secondary | ICD-10-CM | POA: Insufficient documentation

## 2017-06-20 NOTE — Progress Notes (Deleted)
Patient's Name: Michael Cole  MRN: [id number]  Referring Provider: Neale Burly, MD  DOB: 01-Dec-1965  PCP: Neale Burly, MD  DOS: 06/21/2017  Note by: Gaspar Cola, MD  Service setting: Ambulatory outpatient  Specialty: Interventional Pain Management  Location: [facility] (AMB) Pain Management Facility    Patient type: Established   Primary Reason(s) for Visit: Encounter for prescription drug management. (Level of risk: moderate)  CC: No chief complaint on file.  HPI  Michael Cole is a 51 y.o. year old, male patient, who comes today for a medication management evaluation. 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); Long term current use of opiate analgesic; Long term prescription opiate use; Opiate use; Chronic pain syndrome; Chronic lower extremity pain (Secondary Area of Pain) (Bilateral) (R>L); Chronic low back pain (Tertiary Area of Pain) (Bilateral); Pressure ulcer of sacral region, stage 1; Vitamin D deficiency; Insomnia secondary to chronic pain; Neuropathic pain; Disturbance of skin sensation; Muscle spasticity; Post-traumatic spasticity; Chronic musculoskeletal pain; Chronic thoracic back pain (Primary Area of Pain) (midline); Long term prescription benzodiazepine use; History of rib fracture (2005) (Left 8th & 9th rib); Abnormal MRI, cervical spine (11/23/2003); Abnormal MRI, thoracic spine (11/23/2003); Abnormal MRI, lumbar spine (11/23/2003); Dextroconvex Thoracic Spinal Kyphoscoliosis; DDD (degenerative disc disease), thoracic; Schmorl's nodes of lumbar region (L1); Thoracic spinal cord Myelomalacia (T6-T8); DDD (degenerative disc disease), lumbar (L4-5 and L5-S1); 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 His primarily concern today is the No chief complaint on file.  Pain Assessment: Location:     Radiating:   Onset:   Duration:   Quality:   Severity:  /10 (self-reported pain score)  Note: Reported level is compatible with observation.                   When using our objective Pain Scale, any level above a 5/10 is said to belong in an emergency room, as it progressively worsens from a 6/10, which is described as severely limiting. Requiring emergency care not usually available at an outpatient pain management facility. Communication becomes difficult and requires great effort. Assistance to reach the emergency department may be required. Facial flushing and profuse sweating along with potentially dangerous increases in heart rate and blood pressure will be evident. Effect on ADL:   Timing:   Modifying factors:    Michael Cole was last scheduled for an appointment on 05/27/2017 for medication management. During today's appointment we reviewed Michael Cole chronic pain status, as well as his outpatient medication regimen.  The patient  reports that he does not use drugs. His body mass index is unknown because there is no height or weight on file.  Further details on both, my assessment(s), as well as the proposed treatment plan, please see below.  Controlled Substance Pharmacotherapy Assessment REMS (Risk Evaluation and Mitigation Strategy)  Analgesic: Buprenorphine 7.5 MCG/HR patch every 7 (seven) days. MME/day: *** mg/day.  No notes on file Pharmacokinetics: Liberation and absorption (onset of action): WNL Distribution (time to peak effect): WNL Metabolism and excretion (duration of action): WNL         Pharmacodynamics: Desired effects: Analgesia: Michael Cole reports >50% benefit. Functional ability: Patient reports that medication allows him to accomplish basic ADLs Clinically meaningful improvement in function (CMIF): Sustained CMIF goals met Perceived effectiveness:  Described  as relatively effective, allowing for increase in activities of daily living (ADL) Undesirable effects: Side-effects or Adverse reactions: None reported Monitoring: [place] PMP: Online review of the past 66-monthperiod conducted. Compliant with practice rules and regulations  PMP NARX Score Report:  Narcotic: 560 Sedative: 531 Stimulant: 030 PMP NARX Overdose Risk Score: 570 List of all UDS test(s) done:  Lab Results  Component Value Date   SUMMARY FINAL 04/15/2017   Last UDS on record: Summary  Date Value Ref Range Status  04/15/2017 FINAL  Final    Comment:    ==================================================================== TOXASSURE COMP DRUG ANALYSIS,UR ==================================================================== Test                             Result       Flag       Units Drug Present and Declared for Prescription Verification   Desmethyldiazepam              1755         EXPECTED   ng/mg creat   Oxazepam                       >6452        EXPECTED   ng/mg creat   Temazepam                      >6452        EXPECTED   ng/mg creat    Desmethyldiazepam, oxazepam, and temazepam are expected    metabolites of diazepam. Desmethyldiazepam and oxazepam are also    expected metabolites of other drugs, including chlordiazepoxide,    prazepam, clorazepate, and halazepam. Oxazepam is an expected    metabolite of temazepam. Oxazepam and temazepam are also    available as scheduled prescription medications.   Gabapentin                     PRESENT      EXPECTED   Baclofen                       PRESENT      EXPECTED Drug Present not Declared for Prescription Verification   Fluoxetine                     PRESENT      UNEXPECTED   Norfluoxetine                  PRESENT      UNEXPECTED    Norfluoxetine is an expected metabolite of fluoxetine.   Diphenhydramine                PRESENT      UNEXPECTED Drug Absent but Declared for Prescription Verification   Oxycodone                       Not Detected UNEXPECTED ng/mg creat   Cyclobenzaprine                Not Detected UNEXPECTED   Duloxetine                     Not Detected UNEXPECTED   Naproxen                       Not Detected UNEXPECTED   Promethazine  Not Detected UNEXPECTED ==================================================================== Test                      Result    Flag   Units      Ref Range   Creatinine              31               mg/dL      >=20 ==================================================================== Declared Medications:  The flagging and interpretation on this report are based on the  following declared medications.  Unexpected results may arise from  inaccuracies in the declared medications.  **Note: The testing scope of this panel includes these medications:  Baclofen  Cyclobenzaprine  Diazepam  Duloxetine  Gabapentin  Naproxen  Oxycodone  Promethazine  **Note: The testing scope of this panel does not include following  reported medications:  Omeprazole  Ranitidine  Vitamin B  Vitamin C  Zinc ==================================================================== For clinical consultation, please call [phone]. ====================================================================    UDS interpretation: Compliant          Medication Assessment Form: Reviewed. Patient indicates being compliant with therapy Treatment compliance: Compliant Risk Assessment Profile: Aberrant behavior: See prior evaluations. None observed or detected today Comorbid factors increasing risk of overdose: See prior notes. No additional risks detected today Risk of substance use disorder (SUD): Low  ORT Scoring interpretation table:  Score <3 = Low Risk for SUD  Score between 4-7 = Moderate Risk for SUD  Score >8 = High Risk for Opioid Abuse   Risk Mitigation Strategies:  Patient Counseling: Covered Patient-Prescriber Agreement (PPA): Present and active   Notification to other healthcare providers: Done  Pharmacologic Plan: No change in therapy, at this time  Laboratory Chemistry  Inflammation Markers (CRP: Acute Phase) (ESR: Chronic Phase) Lab Results  Component Value Date   CRP 11.9 (H) 04/15/2017   ESRSEDRATE 27 04/15/2017                 Renal Function Markers Lab Results  Component Value Date   BUN 7 04/15/2017   CREATININE 0.63 (L) 04/15/2017   GFRAA 133 04/15/2017   GFRNONAA 115 04/15/2017                 Hepatic Function Markers Lab Results  Component Value Date   AST 7 04/15/2017   ALT 6 04/15/2017   ALBUMIN 4.0 04/15/2017   ALKPHOS 82 04/15/2017                 Electrolytes Lab Results  Component Value Date   NA 140 04/15/2017   K 3.2 (L) 04/15/2017   CL 102 04/15/2017   CALCIUM 9.5 04/15/2017   MG 2.2 04/15/2017                 Neuropathy Markers Lab Results  Component Value Date   VITAMINB12 334 04/15/2017                 Bone Pathology Markers Lab Results  Component Value Date   ALKPHOS 82 04/15/2017   25OHVITD1 12 (L) 04/15/2017   25OHVITD2 <1.0 04/15/2017   25OHVITD3 12 04/15/2017   CALCIUM 9.5 04/15/2017                 Coagulation Parameters Lab Results  Component Value Date   PLT 142 (L) 02/03/2010                 Cardiovascular Markers Lab Results  Component  Value Date   HGB 16.5 02/03/2010   HCT 46.7 02/03/2010                 Note: Lab results reviewed.  Recent Diagnostic Imaging Results  CT Abdomen Pelvis Wo Contrast *RADIOLOGY REPORT*  Clinical Data: Left-sided flank pain  CT ABDOMEN AND PELVIS WITHOUT CONTRAST  Technique:  Multidetector CT imaging of the abdomen and pelvis was performed following the standard protocol without intravenous contrast.  Comparison: 10/28/2011  Findings: Minimal right middle lobe scarring incidentally noted. Lung bases are otherwise clear.  No pleural effusion.  Hepatic steatosis again noted.  Sparing about the gallbladder  fossa incidentally noted.  Spleen, adrenal glands, fatty infiltrated pancreas, and gallbladder are normal.  Bilateral radiopaque renal calculi again noted, including multiple calculi within the left renal pelvis, largest 1.7 cm, increased since previous exam, for example image 36.  There is mild left renal hilar stranding.  No radiopaque ureteral calculus is identified on either side.  1 mm dependent bladder calculus identified, best seen on the image 60 series 6.  There is mild fullness of the left renal pelvis.  Radiopaque right renal calculi have increased since previously, with conglomeration of calculi at the hilum measuring 9 mm image 34.  No bowel wall thickening or focal segmental dilatation.  No pelvic free fluid or lymphadenopathy.  Mild atheromatous calcification of the normal caliber aorta.  Lumbar spine degenerative change incidentally noted.  IMPRESSION: Increased bilateral radiopaque renal stone burden as above.  Mild fullness of the left renal pelvis and surrounding pericolonic stranding may indicate transient obstruction by calculi.  1 mm dependent bladder calculus.  Original Report Authenticated By: Arline Asp, M.D.  Note: Imaging results reviewed.          Meds   Current Outpatient Prescriptions:  .  B Complex Vitamins (VITAMIN-B COMPLEX) TABS, Take 1 tablet by mouth daily., Disp: , Rfl:  .  baclofen (LIORESAL) 20 MG tablet, Take 20 mg by mouth 4 (four) times daily., Disp: , Rfl:  .  Buprenorphine 7.5 MCG/HR PTWK, Place 7.5 mcg/hr onto the skin every 7 (seven) days., Disp: 4 patch, Rfl: 0 .  Calcium Carb-Cholecalciferol (CALCIUM PLUS D3 ABSORBABLE) [phone] MG-UNIT CAPS, Take 1 capsule by mouth daily with breakfast., Disp: 180 capsule, Rfl: 0 .  Cholecalciferol 5000 units capsule, Take 1 capsule (5,000 Units total) by mouth daily., Disp: 180 capsule, Rfl: 0 .  cyanocobalamin ([employer] VITAMIN B12) 2000 MCG tablet, Take 1 tablet (2,000 mcg total) by mouth  daily., Disp: 30 tablet, Rfl: 5 .  cyclobenzaprine (FLEXERIL) 10 MG tablet, Take 10 mg by mouth 3 (three) times daily as needed. For muscle pain, Disp: , Rfl:  .  diazepam (VALIUM) 10 MG tablet, Take 10 mg by mouth every 8 (eight) hours as needed for anxiety or sleep. For anixety, Disp: , Rfl:  .  DULoxetine (CYMBALTA) 60 MG capsule, Take 60 mg by mouth daily., Disp: , Rfl:  .  gabapentin (NEURONTIN) 600 MG tablet, Take 1 tablet (600 mg total) by mouth 4 (four) times daily., Disp: 28 tablet, Rfl: 0 .  Melatonin 10 MG CAPS, Take 20 mg by mouth at bedtime as needed., Disp: 60 capsule, Rfl: 5 .  naproxen (NAPROSYN) 500 MG tablet, Take 500 mg by mouth as needed. For pain, Disp: , Rfl:  .  omeprazole (PRILOSEC) 20 MG capsule, Take 20 mg by mouth daily., Disp: , Rfl:  .  ranitidine (ZANTAC) 150 MG tablet, Take 150 mg by mouth  2 (two) times daily as needed for heartburn., Disp: , Rfl:  .  vitamin C (ASCORBIC ACID) 500 MG tablet, Take 500 mg by mouth 2 (two) times daily., Disp: , Rfl:  .  Vitamin D, Ergocalciferol, (DRISDOL) 50000 units CAPS capsule, Take 1 capsule (50,000 Units total) by mouth weekly for 12 weeks, Disp: 12 capsule, Rfl: 0  ROS  Constitutional: Denies any fever or chills Gastrointestinal: No reported hemesis, hematochezia, vomiting, or acute GI distress Musculoskeletal: Denies any acute onset joint swelling, redness, loss of ROM, or weakness Neurological: No reported episodes of acute onset apraxia, aphasia, dysarthria, agnosia, amnesia, paralysis, loss of coordination, or loss of consciousness  Allergies  Mr. Granja is allergic to latex.  [place]  Drug: Mr. Worrel  reports that he does not use drugs. Alcohol:  reports that he does not drink alcohol. Tobacco:  reports that he has been smoking Cigarettes.  He started smoking about 31 years ago. He has a 20.00 pack-year smoking history. He has never used smokeless tobacco. Medical:  has a past medical history of Depression; Kidney  stones; Lower back injury (2004); and Neuromuscular disorder ([place]). Surgical: Mr. Magnan  has a past surgical history that includes Back surgery and Kidney stone surgery. Family: family history includes Aneurysm in his father; Cancer in his brother; Heart failure in his mother.  Constitutional Exam  General appearance: Well nourished, well developed, and well hydrated. In no apparent acute distress There were no vitals filed for this visit. BMI Assessment: Estimated body mass index is 24.82 kg/m as calculated from the following:   Height as of 05/26/17: 6' (1.829 m).   Weight as of 05/26/17: 183 lb (83 kg).  BMI interpretation table: BMI level Category Range association with higher incidence of chronic pain  <18 kg/m2 Underweight   18.5-24.9 kg/m2 Ideal body weight   25-29.9 kg/m2 Overweight Increased incidence by 20%  30-34.9 kg/m2 Obese (Class I) Increased incidence by 68%  35-39.9 kg/m2 Severe obesity (Class II) Increased incidence by 136%  >40 kg/m2 Extreme obesity (Class III) Increased incidence by 254%   BMI Readings from Last 4 Encounters:  05/26/17 24.82 kg/m  04/15/17 24.41 kg/m  03/23/17 25.09 kg/m  03/21/17 25.09 kg/m   Wt Readings from Last 4 Encounters:  05/26/17 183 lb (83 kg)  04/15/17 180 lb (81.6 kg)  03/23/17 185 lb (83.9 kg)  03/21/17 185 lb (83.9 kg)  Psych/Mental status: Alert, oriented x 3 (person, place, & time)       Eyes: PERLA Respiratory: No evidence of acute respiratory distress  Cervical Spine Area Exam  Skin & Axial Inspection: No masses, redness, edema, swelling, or associated skin lesions Alignment: Symmetrical Functional ROM: Unrestricted ROM      Stability: No instability detected Muscle Tone/Strength: Functionally intact. No obvious neuro-muscular anomalies detected. Sensory (Neurological): Unimpaired Palpation: No palpable anomalies              Upper Extremity (UE) Exam    Side: Right upper extremity  Side: Left upper extremity   Skin & Extremity Inspection: Skin color, temperature, and hair growth are WNL. No peripheral edema or cyanosis. No masses, redness, swelling, asymmetry, or associated skin lesions. No contractures.  Skin & Extremity Inspection: Skin color, temperature, and hair growth are WNL. No peripheral edema or cyanosis. No masses, redness, swelling, asymmetry, or associated skin lesions. No contractures.  Functional ROM: Unrestricted ROM          Functional ROM: Unrestricted ROM  Muscle Tone/Strength: Functionally intact. No obvious neuro-muscular anomalies detected.  Muscle Tone/Strength: Functionally intact. No obvious neuro-muscular anomalies detected.  Sensory (Neurological): Unimpaired          Sensory (Neurological): Unimpaired          Palpation: No palpable anomalies              Palpation: No palpable anomalies              Specialized Test(s): Deferred         Specialized Test(s): Deferred          Thoracic Spine Area Exam  Skin & Axial Inspection: No masses, redness, or swelling Alignment: Symmetrical Functional ROM: Unrestricted ROM Stability: No instability detected Muscle Tone/Strength: Functionally intact. No obvious neuro-muscular anomalies detected. Sensory (Neurological): Unimpaired Muscle strength & Tone: No palpable anomalies  Lumbar Spine Area Exam  Skin & Axial Inspection: No masses, redness, or swelling Alignment: Symmetrical Functional ROM: Unrestricted ROM      Stability: No instability detected Muscle Tone/Strength: Functionally intact. No obvious neuro-muscular anomalies detected. Sensory (Neurological): Unimpaired Palpation: No palpable anomalies       Provocative Tests: Lumbar Hyperextension and rotation test: evaluation deferred today       Lumbar Lateral bending test: evaluation deferred today       Patrick's Maneuver: evaluation deferred today                    Gait & Posture Assessment  Ambulation: Unassisted Gait: Relatively normal for age and body  habitus Posture: WNL   Lower Extremity Exam    Side: Right lower extremity  Side: Left lower extremity  Skin & Extremity Inspection: Skin color, temperature, and hair growth are WNL. No peripheral edema or cyanosis. No masses, redness, swelling, asymmetry, or associated skin lesions. No contractures.  Skin & Extremity Inspection: Skin color, temperature, and hair growth are WNL. No peripheral edema or cyanosis. No masses, redness, swelling, asymmetry, or associated skin lesions. No contractures.  Functional ROM: Unrestricted ROM          Functional ROM: Unrestricted ROM          Muscle Tone/Strength: Functionally intact. No obvious neuro-muscular anomalies detected.  Muscle Tone/Strength: Functionally intact. No obvious neuro-muscular anomalies detected.  Sensory (Neurological): Unimpaired  Sensory (Neurological): Unimpaired  Palpation: No palpable anomalies  Palpation: No palpable anomalies   Assessment  Primary Diagnosis & Pertinent Problem List: The primary encounter diagnosis was Chronic thoracic back pain (Primary Area of Pain) (midline). Diagnoses of Chronic lower extremity pain (Secondary Area of Pain) (Bilateral) (R>L), Chronic low back pain (Tertiary Area of Pain) (Bilateral), Thrombocytopenia (HCC), Long term prescription benzodiazepine use, Long term prescription opiate use, Pharmacologic therapy, Chronic pain syndrome, Chronic bilateral thoracic back pain, DDD (degenerative disc disease), lumbar (L4-5 and L5-S1), DDD (degenerative disc disease), thoracic, Abnormal MRI, lumbar spine (11/23/2003), Abnormal MRI, thoracic spine (11/23/2003), T8 spinal cord injury, sequela ([place]), Thoracic spinal cord Myelomalacia (T6-T8), 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  Status Diagnosis  Controlled Controlled Controlled 1.  Chronic thoracic back pain (Primary Area of Pain) (midline)   2. Chronic lower extremity pain (Secondary Area of Pain) (Bilateral) (R>L)   3. Chronic low back pain [facility] Area of Pain) (Bilateral)   4. Thrombocytopenia ([place])   5. Long term prescription benzodiazepine use   6. Long term prescription opiate use  7. Pharmacologic therapy   8. Chronic pain syndrome   9. Chronic bilateral thoracic back pain   10. DDD (degenerative disc disease), lumbar (L4-5 and L5-S1)   11. DDD (degenerative disc disease), thoracic   12. Abnormal MRI, lumbar spine (11/23/2003)   13. Abnormal MRI, thoracic spine (11/23/2003)   14. T8 spinal cord injury, sequela ([place])   15. Thoracic spinal cord Myelomalacia (T6-T8)   16. 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 Other intervertebral disc degeneration, thoracic region     Problems updated and reviewed during this visit: Problem  Thrombocytopenia (Hcc)  Pharmacologic Therapy   Plan of Care  Pharmacotherapy (Medications Ordered): No orders of the defined types were placed in this encounter.  New Prescriptions   No medications on file   Medications administered today: Mr. Cudworth had no medications administered during this visit.  Procedure Orders    No procedure(s) ordered today   Lab Orders  No laboratory test(s) ordered today   Imaging Orders  No imaging studies ordered today   Referral Orders  No referral(s) requested today    Interventional management options: Planned, scheduled, and/or pending:   None at this time.    Considering:   Possible diagnostic thoracic epidural steroid injection Possible diagnostic thoracic facet injection Possible diagnostic thoracic radiofrequency ablation    Palliative PRN treatment(s):   None at this time   Provider-requested follow-up: No Follow-up on file.  Future Appointments Date Time Provider  [place]  06/21/2017 11:30 AM Milinda Pointer, MD [facility] None   Primary Care Physician: Neale Burly, MD Location: [facility] Outpatient Pain Management Facility Note by: Gaspar Cola, MD Date: 06/21/2017; Time: 7:15 PM

## 2017-06-21 ENCOUNTER — Ambulatory Visit: Payer: Medicare Other | Attending: Pain Medicine | Admitting: Pain Medicine

## 2017-07-12 DIAGNOSIS — Z79899 Other long term (current) drug therapy: Secondary | ICD-10-CM | POA: Diagnosis not present

## 2017-07-12 DIAGNOSIS — M5136 Other intervertebral disc degeneration, lumbar region: Secondary | ICD-10-CM | POA: Diagnosis not present

## 2017-07-12 DIAGNOSIS — M545 Low back pain: Secondary | ICD-10-CM | POA: Diagnosis not present

## 2017-07-13 DIAGNOSIS — G609 Hereditary and idiopathic neuropathy, unspecified: Secondary | ICD-10-CM | POA: Diagnosis not present

## 2017-07-13 DIAGNOSIS — Z466 Encounter for fitting and adjustment of urinary device: Secondary | ICD-10-CM | POA: Diagnosis not present

## 2017-07-13 DIAGNOSIS — G950 Syringomyelia and syringobulbia: Secondary | ICD-10-CM | POA: Diagnosis not present

## 2017-07-13 DIAGNOSIS — F419 Anxiety disorder, unspecified: Secondary | ICD-10-CM | POA: Diagnosis not present

## 2017-07-13 DIAGNOSIS — M549 Dorsalgia, unspecified: Secondary | ICD-10-CM | POA: Diagnosis not present

## 2017-07-13 DIAGNOSIS — G8929 Other chronic pain: Secondary | ICD-10-CM | POA: Diagnosis not present

## 2017-07-29 DIAGNOSIS — M549 Dorsalgia, unspecified: Secondary | ICD-10-CM | POA: Diagnosis not present

## 2017-07-29 DIAGNOSIS — G609 Hereditary and idiopathic neuropathy, unspecified: Secondary | ICD-10-CM | POA: Diagnosis not present

## 2017-07-29 DIAGNOSIS — E669 Obesity, unspecified: Secondary | ICD-10-CM | POA: Diagnosis not present

## 2017-07-29 DIAGNOSIS — Z87440 Personal history of urinary (tract) infections: Secondary | ICD-10-CM | POA: Diagnosis not present

## 2017-07-29 DIAGNOSIS — R202 Paresthesia of skin: Secondary | ICD-10-CM | POA: Diagnosis not present

## 2017-07-29 DIAGNOSIS — Z466 Encounter for fitting and adjustment of urinary device: Secondary | ICD-10-CM | POA: Diagnosis not present

## 2017-07-29 DIAGNOSIS — G8929 Other chronic pain: Secondary | ICD-10-CM | POA: Diagnosis not present

## 2017-07-29 DIAGNOSIS — R69 Illness, unspecified: Secondary | ICD-10-CM | POA: Diagnosis not present

## 2017-07-29 DIAGNOSIS — G950 Syringomyelia and syringobulbia: Secondary | ICD-10-CM | POA: Diagnosis not present

## 2017-07-30 DIAGNOSIS — M549 Dorsalgia, unspecified: Secondary | ICD-10-CM | POA: Diagnosis not present

## 2017-07-30 DIAGNOSIS — G950 Syringomyelia and syringobulbia: Secondary | ICD-10-CM | POA: Diagnosis not present

## 2017-07-30 DIAGNOSIS — Z466 Encounter for fitting and adjustment of urinary device: Secondary | ICD-10-CM | POA: Diagnosis not present

## 2017-07-30 DIAGNOSIS — R69 Illness, unspecified: Secondary | ICD-10-CM | POA: Diagnosis not present

## 2017-07-30 DIAGNOSIS — Z87440 Personal history of urinary (tract) infections: Secondary | ICD-10-CM | POA: Diagnosis not present

## 2017-07-30 DIAGNOSIS — R202 Paresthesia of skin: Secondary | ICD-10-CM | POA: Diagnosis not present

## 2017-07-30 DIAGNOSIS — E669 Obesity, unspecified: Secondary | ICD-10-CM | POA: Diagnosis not present

## 2017-07-30 DIAGNOSIS — G8929 Other chronic pain: Secondary | ICD-10-CM | POA: Diagnosis not present

## 2017-07-30 DIAGNOSIS — G609 Hereditary and idiopathic neuropathy, unspecified: Secondary | ICD-10-CM | POA: Diagnosis not present

## 2017-08-02 DIAGNOSIS — G950 Syringomyelia and syringobulbia: Secondary | ICD-10-CM | POA: Diagnosis not present

## 2017-08-02 DIAGNOSIS — Z466 Encounter for fitting and adjustment of urinary device: Secondary | ICD-10-CM | POA: Diagnosis not present

## 2017-08-02 DIAGNOSIS — M549 Dorsalgia, unspecified: Secondary | ICD-10-CM | POA: Diagnosis not present

## 2017-08-02 DIAGNOSIS — G608 Other hereditary and idiopathic neuropathies: Secondary | ICD-10-CM | POA: Diagnosis not present

## 2017-08-10 DIAGNOSIS — M79605 Pain in left leg: Secondary | ICD-10-CM | POA: Diagnosis not present

## 2017-08-10 DIAGNOSIS — M545 Low back pain: Secondary | ICD-10-CM | POA: Diagnosis not present

## 2017-08-10 DIAGNOSIS — Z79899 Other long term (current) drug therapy: Secondary | ICD-10-CM | POA: Diagnosis not present

## 2017-08-10 DIAGNOSIS — M79604 Pain in right leg: Secondary | ICD-10-CM | POA: Diagnosis not present

## 2017-08-12 DIAGNOSIS — G8222 Paraplegia, incomplete: Secondary | ICD-10-CM | POA: Diagnosis not present

## 2017-08-12 DIAGNOSIS — F320 Major depressive disorder, single episode, mild: Secondary | ICD-10-CM | POA: Diagnosis not present

## 2017-08-12 DIAGNOSIS — F411 Generalized anxiety disorder: Secondary | ICD-10-CM | POA: Diagnosis not present

## 2017-08-12 DIAGNOSIS — K210 Gastro-esophageal reflux disease with esophagitis: Secondary | ICD-10-CM | POA: Diagnosis not present

## 2017-08-12 DIAGNOSIS — R69 Illness, unspecified: Secondary | ICD-10-CM | POA: Diagnosis not present

## 2017-08-12 DIAGNOSIS — F900 Attention-deficit hyperactivity disorder, predominantly inattentive type: Secondary | ICD-10-CM | POA: Diagnosis not present

## 2017-08-29 DIAGNOSIS — R69 Illness, unspecified: Secondary | ICD-10-CM | POA: Diagnosis not present

## 2017-08-29 DIAGNOSIS — Z466 Encounter for fitting and adjustment of urinary device: Secondary | ICD-10-CM | POA: Diagnosis not present

## 2017-08-29 DIAGNOSIS — Z87440 Personal history of urinary (tract) infections: Secondary | ICD-10-CM | POA: Diagnosis not present

## 2017-08-29 DIAGNOSIS — G950 Syringomyelia and syringobulbia: Secondary | ICD-10-CM | POA: Diagnosis not present

## 2017-08-29 DIAGNOSIS — G8929 Other chronic pain: Secondary | ICD-10-CM | POA: Diagnosis not present

## 2017-08-29 DIAGNOSIS — E669 Obesity, unspecified: Secondary | ICD-10-CM | POA: Diagnosis not present

## 2017-08-29 DIAGNOSIS — G609 Hereditary and idiopathic neuropathy, unspecified: Secondary | ICD-10-CM | POA: Diagnosis not present

## 2017-08-29 DIAGNOSIS — R202 Paresthesia of skin: Secondary | ICD-10-CM | POA: Diagnosis not present

## 2017-08-29 DIAGNOSIS — M549 Dorsalgia, unspecified: Secondary | ICD-10-CM | POA: Diagnosis not present

## 2017-09-10 DIAGNOSIS — Z466 Encounter for fitting and adjustment of urinary device: Secondary | ICD-10-CM | POA: Diagnosis not present

## 2017-09-10 DIAGNOSIS — G8929 Other chronic pain: Secondary | ICD-10-CM | POA: Diagnosis not present

## 2017-09-10 DIAGNOSIS — G609 Hereditary and idiopathic neuropathy, unspecified: Secondary | ICD-10-CM | POA: Diagnosis not present

## 2017-09-10 DIAGNOSIS — R69 Illness, unspecified: Secondary | ICD-10-CM | POA: Diagnosis not present

## 2017-09-10 DIAGNOSIS — Z87440 Personal history of urinary (tract) infections: Secondary | ICD-10-CM | POA: Diagnosis not present

## 2017-09-10 DIAGNOSIS — M549 Dorsalgia, unspecified: Secondary | ICD-10-CM | POA: Diagnosis not present

## 2017-09-10 DIAGNOSIS — G950 Syringomyelia and syringobulbia: Secondary | ICD-10-CM | POA: Diagnosis not present

## 2017-09-10 DIAGNOSIS — R202 Paresthesia of skin: Secondary | ICD-10-CM | POA: Diagnosis not present

## 2017-09-10 DIAGNOSIS — E669 Obesity, unspecified: Secondary | ICD-10-CM | POA: Diagnosis not present

## 2017-09-13 DIAGNOSIS — R279 Unspecified lack of coordination: Secondary | ICD-10-CM | POA: Diagnosis not present

## 2017-09-13 DIAGNOSIS — M5136 Other intervertebral disc degeneration, lumbar region: Secondary | ICD-10-CM | POA: Diagnosis not present

## 2017-09-13 DIAGNOSIS — Z79899 Other long term (current) drug therapy: Secondary | ICD-10-CM | POA: Diagnosis not present

## 2017-09-13 DIAGNOSIS — Z743 Need for continuous supervision: Secondary | ICD-10-CM | POA: Diagnosis not present

## 2017-09-13 DIAGNOSIS — M4800 Spinal stenosis, site unspecified: Secondary | ICD-10-CM | POA: Diagnosis not present

## 2017-09-13 DIAGNOSIS — M545 Low back pain: Secondary | ICD-10-CM | POA: Diagnosis not present

## 2017-09-13 DIAGNOSIS — M5134 Other intervertebral disc degeneration, thoracic region: Secondary | ICD-10-CM | POA: Diagnosis not present

## 2017-09-15 DIAGNOSIS — L89154 Pressure ulcer of sacral region, stage 4: Secondary | ICD-10-CM | POA: Diagnosis not present

## 2017-09-15 DIAGNOSIS — Z466 Encounter for fitting and adjustment of urinary device: Secondary | ICD-10-CM | POA: Diagnosis not present

## 2017-09-15 DIAGNOSIS — S12101A Unspecified nondisplaced fracture of second cervical vertebra, initial encounter for closed fracture: Secondary | ICD-10-CM | POA: Diagnosis not present

## 2017-09-28 DIAGNOSIS — E669 Obesity, unspecified: Secondary | ICD-10-CM | POA: Diagnosis not present

## 2017-09-28 DIAGNOSIS — G609 Hereditary and idiopathic neuropathy, unspecified: Secondary | ICD-10-CM | POA: Diagnosis not present

## 2017-09-28 DIAGNOSIS — G950 Syringomyelia and syringobulbia: Secondary | ICD-10-CM | POA: Diagnosis not present

## 2017-09-28 DIAGNOSIS — Z466 Encounter for fitting and adjustment of urinary device: Secondary | ICD-10-CM | POA: Diagnosis not present

## 2017-09-28 DIAGNOSIS — G8929 Other chronic pain: Secondary | ICD-10-CM | POA: Diagnosis not present

## 2017-09-28 DIAGNOSIS — R69 Illness, unspecified: Secondary | ICD-10-CM | POA: Diagnosis not present

## 2017-09-28 DIAGNOSIS — M549 Dorsalgia, unspecified: Secondary | ICD-10-CM | POA: Diagnosis not present

## 2017-09-28 DIAGNOSIS — R202 Paresthesia of skin: Secondary | ICD-10-CM | POA: Diagnosis not present

## 2017-09-28 DIAGNOSIS — Z87440 Personal history of urinary (tract) infections: Secondary | ICD-10-CM | POA: Diagnosis not present

## 2017-10-01 DIAGNOSIS — Z466 Encounter for fitting and adjustment of urinary device: Secondary | ICD-10-CM | POA: Diagnosis not present

## 2017-10-01 DIAGNOSIS — G950 Syringomyelia and syringobulbia: Secondary | ICD-10-CM | POA: Diagnosis not present

## 2017-10-01 DIAGNOSIS — G608 Other hereditary and idiopathic neuropathies: Secondary | ICD-10-CM | POA: Diagnosis not present

## 2017-10-01 DIAGNOSIS — M545 Low back pain: Secondary | ICD-10-CM | POA: Diagnosis not present

## 2017-10-13 DIAGNOSIS — M5134 Other intervertebral disc degeneration, thoracic region: Secondary | ICD-10-CM | POA: Diagnosis not present

## 2017-10-13 DIAGNOSIS — Z79899 Other long term (current) drug therapy: Secondary | ICD-10-CM | POA: Diagnosis not present

## 2017-10-13 DIAGNOSIS — M5136 Other intervertebral disc degeneration, lumbar region: Secondary | ICD-10-CM | POA: Diagnosis not present

## 2017-10-13 DIAGNOSIS — M545 Low back pain: Secondary | ICD-10-CM | POA: Diagnosis not present

## 2017-10-13 DIAGNOSIS — M4800 Spinal stenosis, site unspecified: Secondary | ICD-10-CM | POA: Diagnosis not present

## 2017-10-25 DIAGNOSIS — L89154 Pressure ulcer of sacral region, stage 4: Secondary | ICD-10-CM | POA: Diagnosis not present

## 2017-10-25 DIAGNOSIS — S12101A Unspecified nondisplaced fracture of second cervical vertebra, initial encounter for closed fracture: Secondary | ICD-10-CM | POA: Diagnosis not present

## 2017-10-25 DIAGNOSIS — Z466 Encounter for fitting and adjustment of urinary device: Secondary | ICD-10-CM | POA: Diagnosis not present

## 2017-10-28 DIAGNOSIS — Z87440 Personal history of urinary (tract) infections: Secondary | ICD-10-CM | POA: Diagnosis not present

## 2017-10-28 DIAGNOSIS — M549 Dorsalgia, unspecified: Secondary | ICD-10-CM | POA: Diagnosis not present

## 2017-10-28 DIAGNOSIS — R202 Paresthesia of skin: Secondary | ICD-10-CM | POA: Diagnosis not present

## 2017-10-28 DIAGNOSIS — G950 Syringomyelia and syringobulbia: Secondary | ICD-10-CM | POA: Diagnosis not present

## 2017-10-28 DIAGNOSIS — E669 Obesity, unspecified: Secondary | ICD-10-CM | POA: Diagnosis not present

## 2017-10-28 DIAGNOSIS — Z466 Encounter for fitting and adjustment of urinary device: Secondary | ICD-10-CM | POA: Diagnosis not present

## 2017-10-28 DIAGNOSIS — G8929 Other chronic pain: Secondary | ICD-10-CM | POA: Diagnosis not present

## 2017-10-28 DIAGNOSIS — G609 Hereditary and idiopathic neuropathy, unspecified: Secondary | ICD-10-CM | POA: Diagnosis not present

## 2017-10-28 DIAGNOSIS — R69 Illness, unspecified: Secondary | ICD-10-CM | POA: Diagnosis not present

## 2017-11-11 DIAGNOSIS — Z7401 Bed confinement status: Secondary | ICD-10-CM | POA: Diagnosis not present

## 2017-11-11 DIAGNOSIS — Z79899 Other long term (current) drug therapy: Secondary | ICD-10-CM | POA: Diagnosis not present

## 2017-11-11 DIAGNOSIS — R279 Unspecified lack of coordination: Secondary | ICD-10-CM | POA: Diagnosis not present

## 2017-11-11 DIAGNOSIS — M5134 Other intervertebral disc degeneration, thoracic region: Secondary | ICD-10-CM | POA: Diagnosis not present

## 2017-11-11 DIAGNOSIS — M4800 Spinal stenosis, site unspecified: Secondary | ICD-10-CM | POA: Diagnosis not present

## 2017-11-11 DIAGNOSIS — M545 Low back pain: Secondary | ICD-10-CM | POA: Diagnosis not present

## 2017-11-11 DIAGNOSIS — M5136 Other intervertebral disc degeneration, lumbar region: Secondary | ICD-10-CM | POA: Diagnosis not present

## 2017-11-25 DIAGNOSIS — R279 Unspecified lack of coordination: Secondary | ICD-10-CM | POA: Diagnosis not present

## 2017-11-25 DIAGNOSIS — K210 Gastro-esophageal reflux disease with esophagitis: Secondary | ICD-10-CM | POA: Diagnosis not present

## 2017-11-25 DIAGNOSIS — Z7401 Bed confinement status: Secondary | ICD-10-CM | POA: Diagnosis not present

## 2017-11-25 DIAGNOSIS — R69 Illness, unspecified: Secondary | ICD-10-CM | POA: Diagnosis not present

## 2017-11-25 DIAGNOSIS — G8222 Paraplegia, incomplete: Secondary | ICD-10-CM | POA: Diagnosis not present

## 2017-11-26 DIAGNOSIS — G950 Syringomyelia and syringobulbia: Secondary | ICD-10-CM | POA: Diagnosis not present

## 2017-11-26 DIAGNOSIS — Z9289 Personal history of other medical treatment: Secondary | ICD-10-CM

## 2017-11-26 DIAGNOSIS — Z466 Encounter for fitting and adjustment of urinary device: Secondary | ICD-10-CM | POA: Diagnosis not present

## 2017-11-26 DIAGNOSIS — R202 Paresthesia of skin: Secondary | ICD-10-CM | POA: Diagnosis not present

## 2017-11-26 DIAGNOSIS — G609 Hereditary and idiopathic neuropathy, unspecified: Secondary | ICD-10-CM | POA: Diagnosis not present

## 2017-11-26 DIAGNOSIS — R69 Illness, unspecified: Secondary | ICD-10-CM | POA: Diagnosis not present

## 2017-11-26 DIAGNOSIS — G8929 Other chronic pain: Secondary | ICD-10-CM | POA: Diagnosis not present

## 2017-11-26 DIAGNOSIS — Z87440 Personal history of urinary (tract) infections: Secondary | ICD-10-CM | POA: Diagnosis not present

## 2017-11-26 DIAGNOSIS — M549 Dorsalgia, unspecified: Secondary | ICD-10-CM | POA: Diagnosis not present

## 2017-11-26 DIAGNOSIS — E669 Obesity, unspecified: Secondary | ICD-10-CM | POA: Diagnosis not present

## 2017-11-26 HISTORY — DX: Personal history of other medical treatment: Z92.89

## 2017-11-26 HISTORY — PX: CYSTOSCOPY: SUR368

## 2017-11-29 DIAGNOSIS — S12101A Unspecified nondisplaced fracture of second cervical vertebra, initial encounter for closed fracture: Secondary | ICD-10-CM | POA: Diagnosis not present

## 2017-11-29 DIAGNOSIS — N200 Calculus of kidney: Secondary | ICD-10-CM | POA: Diagnosis not present

## 2017-11-29 DIAGNOSIS — R51 Headache: Secondary | ICD-10-CM | POA: Diagnosis not present

## 2017-11-29 DIAGNOSIS — Z466 Encounter for fitting and adjustment of urinary device: Secondary | ICD-10-CM | POA: Diagnosis not present

## 2017-11-29 DIAGNOSIS — R1310 Dysphagia, unspecified: Secondary | ICD-10-CM | POA: Diagnosis not present

## 2017-11-29 DIAGNOSIS — R571 Hypovolemic shock: Secondary | ICD-10-CM | POA: Diagnosis not present

## 2017-11-29 DIAGNOSIS — T83498A Other mechanical complication of other prosthetic devices, implants and grafts of genital tract, initial encounter: Secondary | ICD-10-CM | POA: Diagnosis not present

## 2017-11-29 DIAGNOSIS — N368 Other specified disorders of urethra: Secondary | ICD-10-CM | POA: Diagnosis not present

## 2017-11-29 DIAGNOSIS — R578 Other shock: Secondary | ICD-10-CM | POA: Diagnosis not present

## 2017-11-29 DIAGNOSIS — T82898A Other specified complication of vascular prosthetic devices, implants and grafts, initial encounter: Secondary | ICD-10-CM | POA: Diagnosis not present

## 2017-11-29 DIAGNOSIS — N2889 Other specified disorders of kidney and ureter: Secondary | ICD-10-CM | POA: Diagnosis not present

## 2017-11-29 DIAGNOSIS — Y846 Urinary catheterization as the cause of abnormal reaction of the patient, or of later complication, without mention of misadventure at the time of the procedure: Secondary | ICD-10-CM | POA: Diagnosis not present

## 2017-11-29 DIAGNOSIS — S37099A Other injury of unspecified kidney, initial encounter: Secondary | ICD-10-CM | POA: Diagnosis not present

## 2017-11-29 DIAGNOSIS — Z7401 Bed confinement status: Secondary | ICD-10-CM | POA: Diagnosis not present

## 2017-11-29 DIAGNOSIS — L89154 Pressure ulcer of sacral region, stage 4: Secondary | ICD-10-CM | POA: Diagnosis not present

## 2017-11-29 DIAGNOSIS — R05 Cough: Secondary | ICD-10-CM | POA: Diagnosis not present

## 2017-11-29 DIAGNOSIS — Z79899 Other long term (current) drug therapy: Secondary | ICD-10-CM | POA: Diagnosis not present

## 2017-11-29 DIAGNOSIS — N201 Calculus of ureter: Secondary | ICD-10-CM | POA: Diagnosis not present

## 2017-11-29 DIAGNOSIS — S3730XA Unspecified injury of urethra, initial encounter: Secondary | ICD-10-CM | POA: Diagnosis not present

## 2017-11-30 ENCOUNTER — Inpatient Hospital Stay (HOSPITAL_COMMUNITY)
Admission: AD | Admit: 2017-11-30 | Discharge: 2017-12-08 | DRG: 698 | Disposition: A | Discharge status: Skilled Nursing Facility | Payer: Medicare HMO | Source: Other Acute Inpatient Hospital | Attending: Family Medicine | Admitting: Family Medicine

## 2017-11-30 ENCOUNTER — Inpatient Hospital Stay (HOSPITAL_COMMUNITY): Payer: Medicare HMO

## 2017-11-30 DIAGNOSIS — Z8249 Family history of ischemic heart disease and other diseases of the circulatory system: Secondary | ICD-10-CM | POA: Diagnosis not present

## 2017-11-30 DIAGNOSIS — Z466 Encounter for fitting and adjustment of urinary device: Secondary | ICD-10-CM | POA: Diagnosis not present

## 2017-11-30 DIAGNOSIS — S3710XA Unspecified injury of ureter, initial encounter: Secondary | ICD-10-CM | POA: Diagnosis not present

## 2017-11-30 DIAGNOSIS — I959 Hypotension, unspecified: Secondary | ICD-10-CM | POA: Diagnosis present

## 2017-11-30 DIAGNOSIS — A419 Sepsis, unspecified organism: Secondary | ICD-10-CM | POA: Diagnosis present

## 2017-11-30 DIAGNOSIS — J9811 Atelectasis: Secondary | ICD-10-CM | POA: Diagnosis present

## 2017-11-30 DIAGNOSIS — D62 Acute posthemorrhagic anemia: Secondary | ICD-10-CM | POA: Diagnosis not present

## 2017-11-30 DIAGNOSIS — Y846 Urinary catheterization as the cause of abnormal reaction of the patient, or of later complication, without mention of misadventure at the time of the procedure: Secondary | ICD-10-CM | POA: Diagnosis not present

## 2017-11-30 DIAGNOSIS — J9601 Acute respiratory failure with hypoxia: Secondary | ICD-10-CM | POA: Diagnosis present

## 2017-11-30 DIAGNOSIS — Z809 Family history of malignant neoplasm, unspecified: Secondary | ICD-10-CM

## 2017-11-30 DIAGNOSIS — N210 Calculus in bladder: Secondary | ICD-10-CM | POA: Diagnosis present

## 2017-11-30 DIAGNOSIS — T148XXS Other injury of unspecified body region, sequela: Secondary | ICD-10-CM | POA: Diagnosis not present

## 2017-11-30 DIAGNOSIS — L89153 Pressure ulcer of sacral region, stage 3: Secondary | ICD-10-CM | POA: Diagnosis present

## 2017-11-30 DIAGNOSIS — N390 Urinary tract infection, site not specified: Secondary | ICD-10-CM | POA: Diagnosis not present

## 2017-11-30 DIAGNOSIS — R6520 Severe sepsis without septic shock: Secondary | ICD-10-CM | POA: Diagnosis not present

## 2017-11-30 DIAGNOSIS — R918 Other nonspecific abnormal finding of lung field: Secondary | ICD-10-CM | POA: Diagnosis not present

## 2017-11-30 DIAGNOSIS — G92 Toxic encephalopathy: Secondary | ICD-10-CM | POA: Diagnosis not present

## 2017-11-30 DIAGNOSIS — N179 Acute kidney failure, unspecified: Secondary | ICD-10-CM | POA: Diagnosis not present

## 2017-11-30 DIAGNOSIS — R4189 Other symptoms and signs involving cognitive functions and awareness: Secondary | ICD-10-CM | POA: Diagnosis present

## 2017-11-30 DIAGNOSIS — G950 Syringomyelia and syringobulbia: Secondary | ICD-10-CM | POA: Diagnosis not present

## 2017-11-30 DIAGNOSIS — M545 Low back pain: Secondary | ICD-10-CM | POA: Diagnosis not present

## 2017-11-30 DIAGNOSIS — T83098A Other mechanical complication of other indwelling urethral catheter, initial encounter: Secondary | ICD-10-CM | POA: Diagnosis not present

## 2017-11-30 DIAGNOSIS — T83518A Infection and inflammatory reaction due to other urinary catheter, initial encounter: Secondary | ICD-10-CM | POA: Diagnosis not present

## 2017-11-30 DIAGNOSIS — N319 Neuromuscular dysfunction of bladder, unspecified: Secondary | ICD-10-CM | POA: Diagnosis present

## 2017-11-30 DIAGNOSIS — R29818 Other symptoms and signs involving the nervous system: Secondary | ICD-10-CM | POA: Diagnosis not present

## 2017-11-30 DIAGNOSIS — R4182 Altered mental status, unspecified: Secondary | ICD-10-CM | POA: Diagnosis not present

## 2017-11-30 DIAGNOSIS — B377 Candidal sepsis: Secondary | ICD-10-CM | POA: Diagnosis not present

## 2017-11-30 DIAGNOSIS — G608 Other hereditary and idiopathic neuropathies: Secondary | ICD-10-CM | POA: Diagnosis not present

## 2017-11-30 DIAGNOSIS — D696 Thrombocytopenia, unspecified: Secondary | ICD-10-CM | POA: Diagnosis present

## 2017-11-30 DIAGNOSIS — R05 Cough: Secondary | ICD-10-CM | POA: Diagnosis not present

## 2017-11-30 DIAGNOSIS — Z719 Counseling, unspecified: Secondary | ICD-10-CM

## 2017-11-30 DIAGNOSIS — Z91040 Latex allergy status: Secondary | ICD-10-CM | POA: Diagnosis not present

## 2017-11-30 DIAGNOSIS — E876 Hypokalemia: Secondary | ICD-10-CM | POA: Diagnosis not present

## 2017-11-30 DIAGNOSIS — K5900 Constipation, unspecified: Secondary | ICD-10-CM | POA: Diagnosis present

## 2017-11-30 DIAGNOSIS — F17210 Nicotine dependence, cigarettes, uncomplicated: Secondary | ICD-10-CM | POA: Diagnosis present

## 2017-11-30 DIAGNOSIS — J9600 Acute respiratory failure, unspecified whether with hypoxia or hypercapnia: Secondary | ICD-10-CM

## 2017-11-30 DIAGNOSIS — L8990 Pressure ulcer of unspecified site, unspecified stage: Secondary | ICD-10-CM

## 2017-11-30 DIAGNOSIS — G8220 Paraplegia, unspecified: Secondary | ICD-10-CM | POA: Diagnosis present

## 2017-11-30 DIAGNOSIS — R69 Illness, unspecified: Secondary | ICD-10-CM | POA: Diagnosis not present

## 2017-11-30 DIAGNOSIS — R319 Hematuria, unspecified: Secondary | ICD-10-CM | POA: Diagnosis present

## 2017-11-30 DIAGNOSIS — I340 Nonrheumatic mitral (valve) insufficiency: Secondary | ICD-10-CM | POA: Diagnosis not present

## 2017-11-30 DIAGNOSIS — T83511A Infection and inflammatory reaction due to indwelling urethral catheter, initial encounter: Secondary | ICD-10-CM | POA: Diagnosis not present

## 2017-11-30 DIAGNOSIS — N201 Calculus of ureter: Secondary | ICD-10-CM | POA: Diagnosis not present

## 2017-11-30 DIAGNOSIS — S12101A Unspecified nondisplaced fracture of second cervical vertebra, initial encounter for closed fracture: Secondary | ICD-10-CM | POA: Diagnosis not present

## 2017-11-30 DIAGNOSIS — L89154 Pressure ulcer of sacral region, stage 4: Secondary | ICD-10-CM | POA: Diagnosis not present

## 2017-11-30 HISTORY — DX: Personal history of other medical treatment: Z92.89

## 2017-11-30 HISTORY — DX: Low back pain: M54.5

## 2017-11-30 HISTORY — DX: Other chronic pain: G89.29

## 2017-11-30 HISTORY — DX: Anxiety disorder, unspecified: F41.9

## 2017-11-30 HISTORY — DX: Presence of other specified devices: Z97.8

## 2017-11-30 HISTORY — DX: Personal history of urinary calculi: Z87.442

## 2017-11-30 HISTORY — DX: Paraplegia, unspecified: G82.20

## 2017-11-30 HISTORY — DX: Gastro-esophageal reflux disease without esophagitis: K21.9

## 2017-11-30 HISTORY — DX: Presence of urogenital implants: Z96.0

## 2017-11-30 LAB — URINALYSIS, ROUTINE W REFLEX MICROSCOPIC
Bilirubin Urine: NEGATIVE
Glucose, UA: NEGATIVE mg/dL
Ketones, ur: NEGATIVE mg/dL
Nitrite: NEGATIVE
PH: 5 (ref 5.0–8.0)
PROTEIN: NEGATIVE mg/dL
Specific Gravity, Urine: 1.011 (ref 1.005–1.030)

## 2017-11-30 LAB — GLUCOSE, CAPILLARY: GLUCOSE-CAPILLARY: 80 mg/dL (ref 65–99)

## 2017-11-30 LAB — APTT: aPTT: 44 seconds — ABNORMAL HIGH (ref 24–36)

## 2017-11-30 LAB — PROTIME-INR
INR: 1.48
Prothrombin Time: 17.8 seconds — ABNORMAL HIGH (ref 11.4–15.2)

## 2017-11-30 LAB — MRSA PCR SCREENING: MRSA by PCR: NEGATIVE

## 2017-11-30 MED ORDER — SENNA 8.6 MG PO TABS
1.0000 | ORAL_TABLET | Freq: Every day | ORAL | Status: DC
  Administered 2017-11-30 – 2017-12-07 (×7): 8.6 mg via ORAL
  Filled 2017-11-30 (×8): qty 1

## 2017-11-30 MED ORDER — ACETAMINOPHEN 325 MG PO TABS
650.0000 mg | ORAL_TABLET | ORAL | Status: DC | PRN
  Administered 2017-12-04: 650 mg via ORAL
  Filled 2017-11-30: qty 2

## 2017-11-30 MED ORDER — ALBUTEROL SULFATE (2.5 MG/3ML) 0.083% IN NEBU: 2.5000 mg | INHALATION_SOLUTION | RESPIRATORY_TRACT | Status: DC | PRN

## 2017-11-30 MED ORDER — BISACODYL 10 MG RE SUPP
10.0000 mg | Freq: Every day | RECTAL | Status: DC | PRN
  Administered 2017-12-04: 10 mg via RECTAL
  Filled 2017-11-30: qty 1

## 2017-11-30 MED ORDER — SODIUM CHLORIDE 0.9 % IV SOLN: 250.0000 mL | INTRAVENOUS | Status: DC | PRN

## 2017-11-30 MED ORDER — SODIUM CHLORIDE 0.9 % IV SOLN
Freq: Once | INTRAVENOUS | Status: AC
  Administered 2017-11-30: 23:00:00 via INTRAVENOUS

## 2017-11-30 MED ORDER — FAMOTIDINE 20 MG PO TABS
20.0000 mg | ORAL_TABLET | Freq: Two times a day (BID) | ORAL | Status: DC
  Administered 2017-11-30 – 2017-12-08 (×15): 20 mg via ORAL
  Filled 2017-11-30 (×16): qty 1

## 2017-11-30 MED ORDER — SODIUM CHLORIDE 0.9 % IV SOLN
INTRAVENOUS | Status: DC
  Administered 2017-11-30 – 2017-12-01 (×2): via INTRAVENOUS

## 2017-11-30 MED ORDER — DOCUSATE SODIUM 100 MG PO CAPS
100.0000 mg | ORAL_CAPSULE | Freq: Two times a day (BID) | ORAL | Status: DC
  Administered 2017-11-30 – 2017-12-07 (×14): 100 mg via ORAL
  Filled 2017-11-30 (×15): qty 1

## 2017-11-30 MED ORDER — SODIUM CHLORIDE 0.9 % IV SOLN
1.0000 g | INTRAVENOUS | Status: DC
  Administered 2017-11-30 – 2017-12-01 (×2): 1 g via INTRAVENOUS
  Filled 2017-11-30 (×2): qty 10

## 2017-11-30 MED ORDER — ONDANSETRON HCL 4 MG/2ML IJ SOLN: 4.0000 mg | Freq: Four times a day (QID) | INTRAMUSCULAR | Status: DC | PRN

## 2017-11-30 MED ORDER — OXYCODONE HCL 5 MG PO TABS
5.0000 mg | ORAL_TABLET | ORAL | Status: DC | PRN
  Administered 2017-11-30 – 2017-12-01 (×3): 5 mg via ORAL
  Filled 2017-11-30 (×3): qty 1
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
## 2017-11-30 NOTE — Progress Notes (Signed)
eLink Physician-Brief Progress Note Patient Name: Michael Cole DOB: 1965/12/30 MRN: [id number]   Date of Service  11/30/2017  HPI/Events of Note  Hypotension  eICU Interventions  500 ml saline bolus x 1        Navi Ew[place] U Daviyon Widmayer 11/30/2017, 11:11 PM
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
## 2017-12-01 ENCOUNTER — Other Ambulatory Visit: Payer: Self-pay

## 2017-12-01 ENCOUNTER — Encounter (HOSPITAL_COMMUNITY): Payer: Self-pay

## 2017-12-01 DIAGNOSIS — N390 Urinary tract infection, site not specified: Secondary | ICD-10-CM

## 2017-12-01 DIAGNOSIS — R319 Hematuria, unspecified: Secondary | ICD-10-CM

## 2017-12-01 DIAGNOSIS — T83511A Infection and inflammatory reaction due to indwelling urethral catheter, initial encounter: Secondary | ICD-10-CM

## 2017-12-01 DIAGNOSIS — L8990 Pressure ulcer of unspecified site, unspecified stage: Secondary | ICD-10-CM

## 2017-12-01 DIAGNOSIS — A419 Sepsis, unspecified organism: Secondary | ICD-10-CM

## 2017-12-01 LAB — TYPE AND SCREEN
ABO/RH(D): O POS
ANTIBODY SCREEN: NEGATIVE

## 2017-12-01 LAB — CBC
HCT: 42.7 % (ref 39.0–52.0)
Hemoglobin: 14.2 g/dL (ref 13.0–17.0)
MCH: 30.1 pg (ref 26.0–34.0)
MCHC: 33.3 g/dL (ref 30.0–36.0)
MCV: 90.7 fL (ref 78.0–100.0)
PLATELETS: 45 10*3/uL — AB (ref 150–400)
RBC: 4.71 MIL/uL (ref 4.22–5.81)
RDW: 16.2 % — ABNORMAL HIGH (ref 11.5–15.5)
WBC: 26 10*3/uL — ABNORMAL HIGH (ref 4.0–10.5)

## 2017-12-01 LAB — BASIC METABOLIC PANEL
Anion gap: 10 (ref 5–15)
BUN: 17 mg/dL (ref 6–20)
CO2: 23 mmol/L (ref 22–32)
Calcium: 8.4 mg/dL — ABNORMAL LOW (ref 8.9–10.3)
Chloride: 104 mmol/L (ref 101–111)
Creatinine, Ser: 0.83 mg/dL (ref 0.61–1.24)
GFR calc Af Amer: >60 mL/min (ref >60)
GFR calc non Af Amer: >60 mL/min (ref >60)
GLUCOSE: 88 mg/dL (ref 65–99)
POTASSIUM: 3.9 mmol/L (ref 3.5–5.1)
Sodium: 137 mmol/L (ref 135–145)

## 2017-12-01 LAB — COMPREHENSIVE METABOLIC PANEL
ALBUMIN: 2.8 g/dL — AB (ref 3.5–5.0)
ALK PHOS: 76 U/L (ref 38–126)
ALT: 30 U/L (ref 17–63)
AST: 47 U/L — AB (ref 15–41)
Anion gap: 13 (ref 5–15)
BILIRUBIN TOTAL: 1.3 mg/dL — AB (ref 0.3–1.2)
BUN: 21 mg/dL — AB (ref 6–20)
CALCIUM: 8.5 mg/dL — AB (ref 8.9–10.3)
CO2: 22 mmol/L (ref 22–32)
CREATININE: 1 mg/dL (ref 0.61–1.24)
Chloride: 103 mmol/L (ref 101–111)
GFR calc Af Amer: >60 mL/min (ref >60)
GFR calc non Af Amer: >60 mL/min (ref >60)
GLUCOSE: 87 mg/dL (ref 65–99)
Potassium: 3.6 mmol/L (ref 3.5–5.1)
SODIUM: 138 mmol/L (ref 135–145)
TOTAL PROTEIN: 6.2 g/dL — AB (ref 6.5–8.1)

## 2017-12-01 LAB — LACTIC ACID, PLASMA
LACTIC ACID, VENOUS: 2.1 mmol/L — AB (ref 0.5–1.9)
LACTIC ACID, VENOUS: 1.3 mmol/L (ref 0.5–1.9)
LACTIC ACID, VENOUS: 1.4 mmol/L (ref 0.5–1.9)

## 2017-12-01 LAB — MAGNESIUM
Magnesium: 1.3 mg/dL — ABNORMAL LOW (ref 1.7–2.4)
Magnesium: 1.9 mg/dL (ref 1.7–2.4)

## 2017-12-01 LAB — PROCALCITONIN: Procalcitonin: 59.91 ng/mL

## 2017-12-01 LAB — CORTISOL: Cortisol, Plasma: 18.3 ug/dL

## 2017-12-01 LAB — GLUCOSE, CAPILLARY
GLUCOSE-CAPILLARY: 90 mg/dL (ref 65–99)
Glucose-Capillary: 79 mg/dL (ref 65–99)

## 2017-12-01 LAB — ABO/RH: ABO/RH(D): O POS

## 2017-12-01 LAB — HIV ANTIBODY (ROUTINE TESTING W REFLEX): HIV Screen 4th Generation wRfx: NONREACTIVE

## 2017-12-01 LAB — PHOSPHORUS: Phosphorus: 2.8 mg/dL (ref 2.5–4.6)

## 2017-12-01 MED ORDER — MAGNESIUM SULFATE 2 GM/50ML IV SOLN
2.0000 g | Freq: Once | INTRAVENOUS | Status: AC
  Administered 2017-12-01: 2 g via INTRAVENOUS
  Filled 2017-12-01: qty 50

## 2017-12-01 MED ORDER — PANTOPRAZOLE SODIUM 40 MG PO TBEC
40.0000 mg | DELAYED_RELEASE_TABLET | Freq: Every day | ORAL | Status: DC
  Administered 2017-12-01 – 2017-12-08 (×8): 40 mg via ORAL
  Filled 2017-12-01 (×8): qty 1

## 2017-12-01 MED ORDER — POTASSIUM CHLORIDE 10 MEQ/100ML IV SOLN
10.0000 meq | INTRAVENOUS | Status: DC
  Administered 2017-12-01 (×3): 10 meq via INTRAVENOUS
  Filled 2017-12-01 (×3): qty 100

## 2017-12-01 MED ORDER — VITAMIN C 500 MG PO TABS
500.0000 mg | ORAL_TABLET | Freq: Two times a day (BID) | ORAL | Status: DC
  Administered 2017-12-01 – 2017-12-08 (×14): 500 mg via ORAL
  Filled 2017-12-01 (×15): qty 1

## 2017-12-01 MED ORDER — OXYCODONE HCL 5 MG PO TABS
10.0000 mg | ORAL_TABLET | ORAL | Status: DC | PRN
  Administered 2017-12-01 – 2017-12-08 (×18): 10 mg via ORAL
  Filled 2017-12-01 (×18): qty 2

## 2017-12-01 MED ORDER — GABAPENTIN 600 MG PO TABS
600.0000 mg | ORAL_TABLET | Freq: Four times a day (QID) | ORAL | Status: DC
  Administered 2017-12-01 – 2017-12-02 (×3): 600 mg via ORAL
  Filled 2017-12-01 (×6): qty 1

## 2017-12-01 MED ORDER — DULOXETINE HCL 60 MG PO CPEP
60.0000 mg | ORAL_CAPSULE | Freq: Every day | ORAL | Status: DC
  Administered 2017-12-01 – 2017-12-08 (×8): 60 mg via ORAL
  Filled 2017-12-01 (×9): qty 1

## 2017-12-01 MED ORDER — SODIUM CHLORIDE 0.9 % IV BOLUS (SEPSIS)
500.0000 mL | Freq: Once | INTRAVENOUS | Status: AC
  Administered 2017-12-01: 500 mL via INTRAVENOUS

## 2017-12-01 MED ORDER — GABAPENTIN 300 MG PO CAPS
600.0000 mg | ORAL_CAPSULE | Freq: Four times a day (QID) | ORAL | Status: DC
  Administered 2017-12-01: 600 mg via ORAL
  Filled 2017-12-01 (×2): qty 2

## 2017-12-01 MED ORDER — CYCLOBENZAPRINE HCL 10 MG PO TABS
5.0000 mg | ORAL_TABLET | Freq: Three times a day (TID) | ORAL | Status: DC | PRN
  Administered 2017-12-01: 5 mg via ORAL
  Filled 2017-12-01: qty 1

## 2017-12-01 MED ORDER — BACLOFEN 20 MG PO TABS
20.0000 mg | ORAL_TABLET | Freq: Four times a day (QID) | ORAL | Status: DC
  Administered 2017-12-01 – 2017-12-08 (×29): 20 mg via ORAL
  Filled 2017-12-01 (×31): qty 1

## 2017-12-01 NOTE — Progress Notes (Signed)
PT Cancellation Note  Patient Details Name: Michael Cole MRN: [id number] DOB: 1965-11-17   Cancelled Treatment:    Reason Eval/Treat Not Completed: Active bedrest order. PT will continue to f/u and await updated activity orders.   [place] 12/01/2017, 3:10 PM

## 2017-12-01 NOTE — Consult Note (Signed)
[facility] Nurse wound consult note Reason for Consult: Consult requested for buttocks.  Pt states he had a wound to left ischium which is almost healed and occurred when he fell awhile ago. Wound type: Left ischium appears to be a healing stage 3 pressure injury; 4X.2X.1cm, pink and dry linear wound; no odor, drainage, or fluctuance when probed. Sacum and upper buttocks with dry patchy areas of partial thickness skin loss and peeling skin; appearance is consistent with moisture associated skin damage.  Bedside nurse states that pt has a deep tissue injury to right heel; 1.5X1.5cm, dark purple with intact skin.   Pressure Injury POA: Yes Dressing procedure/placement/frequency: Foam dressing to sacrum/buttocks and left ischium to protect and promote healing.  Pt has Prevalon boots in place to pressure. Please re-consult if further assistance is needed.  Thank-you,  Julien Girt MSN, [place], [place], [place], [place]

## 2017-12-01 NOTE — Progress Notes (Signed)
CRITICAL VALUE ALERT  Critical Value:  Lactic Acid   Date & Time Notied: 12/01/2017 0034  Provider Notified: Dr. Trevor Mace  Orders Received/Actions taken: Dr. Aletha Halim look over labs / monitor.
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
## 2017-12-02 ENCOUNTER — Inpatient Hospital Stay (HOSPITAL_COMMUNITY): Payer: Medicare HMO

## 2017-12-02 DIAGNOSIS — G92 Toxic encephalopathy: Secondary | ICD-10-CM

## 2017-12-02 LAB — POCT I-STAT 3, ART BLOOD GAS (G3+)
Acid-base deficit: 1 mmol/L (ref 0.0–2.0)
Bicarbonate: 25.2 mmol/L (ref 20.0–28.0)
O2 Saturation: 93 %
PCO2 ART: 48.3 mmHg — AB (ref 32.0–48.0)
PH ART: 7.327 — AB (ref 7.350–7.450)
TCO2: 27 mmol/L (ref 22–32)
pO2, Arterial: 76 mmHg — ABNORMAL LOW (ref 83.0–108.0)

## 2017-12-02 LAB — BASIC METABOLIC PANEL
Anion gap: 9 (ref 5–15)
Anion gap: 9 (ref 5–15)
BUN: 12 mg/dL (ref 6–20)
BUN: 12 mg/dL (ref 6–20)
CHLORIDE: 108 mmol/L (ref 101–111)
CHLORIDE: 109 mmol/L (ref 101–111)
CO2: 23 mmol/L (ref 22–32)
CO2: 25 mmol/L (ref 22–32)
Calcium: 8.5 mg/dL — ABNORMAL LOW (ref 8.9–10.3)
Calcium: 8.8 mg/dL — ABNORMAL LOW (ref 8.9–10.3)
Creatinine, Ser: 0.67 mg/dL (ref 0.61–1.24)
Creatinine, Ser: 0.69 mg/dL (ref 0.61–1.24)
GFR calc Af Amer: >60 mL/min (ref >60)
GFR calc Af Amer: >60 mL/min (ref >60)
GFR calc non Af Amer: >60 mL/min (ref >60)
GFR calc non Af Amer: >60 mL/min (ref >60)
GLUCOSE: 110 mg/dL — AB (ref 65–99)
Glucose, Bld: 102 mg/dL — ABNORMAL HIGH (ref 65–99)
POTASSIUM: 3.5 mmol/L (ref 3.5–5.1)
Potassium: 3.3 mmol/L — ABNORMAL LOW (ref 3.5–5.1)
SODIUM: 140 mmol/L (ref 135–145)
Sodium: 143 mmol/L (ref 135–145)

## 2017-12-02 LAB — GLUCOSE, CAPILLARY
GLUCOSE-CAPILLARY: 119 mg/dL — AB (ref 65–99)
GLUCOSE-CAPILLARY: 92 mg/dL (ref 65–99)

## 2017-12-02 LAB — URINE CULTURE

## 2017-12-02 LAB — CBC
HCT: 41.2 % (ref 39.0–52.0)
HEMATOCRIT: 42.2 % (ref 39.0–52.0)
HEMOGLOBIN: 13.5 g/dL (ref 13.0–17.0)
Hemoglobin: 14 g/dL (ref 13.0–17.0)
MCH: 30.1 pg (ref 26.0–34.0)
MCH: 30.1 pg (ref 26.0–34.0)
MCHC: 33.2 g/dL (ref 30.0–36.0)
MCHC: 32.8 g/dL (ref 30.0–36.0)
MCV: 90.8 fL (ref 78.0–100.0)
MCV: 92 fL (ref 78.0–100.0)
Platelets: 58 10*3/uL — ABNORMAL LOW (ref 150–400)
Platelets: 48 10*3/uL — ABNORMAL LOW (ref 150–400)
RBC: 4.48 MIL/uL (ref 4.22–5.81)
RBC: 4.65 MIL/uL (ref 4.22–5.81)
RDW: 16.2 % — ABNORMAL HIGH (ref 11.5–15.5)
RDW: 16.3 % — ABNORMAL HIGH (ref 11.5–15.5)
WBC: 20.2 10*3/uL — ABNORMAL HIGH (ref 4.0–10.5)
WBC: 14.2 10*3/uL — ABNORMAL HIGH (ref 4.0–10.5)

## 2017-12-02 LAB — PROCALCITONIN: Procalcitonin: 22.17 ng/mL

## 2017-12-02 MED ORDER — POTASSIUM CHLORIDE 10 MEQ/100ML IV SOLN
10.0000 meq | INTRAVENOUS | Status: AC
  Administered 2017-12-02 (×2): 10 meq via INTRAVENOUS
  Filled 2017-12-02 (×2): qty 100

## 2017-12-02 MED ORDER — ACETAMINOPHEN 650 MG RE SUPP
650.0000 mg | RECTAL | Status: DC | PRN
Start: 2017-12-02 — End: 2017-12-02
  Administered 2017-12-02: 650 mg via RECTAL
  Filled 2017-12-02: qty 1

## 2017-12-02 MED ORDER — GABAPENTIN 600 MG PO TABS
600.0000 mg | ORAL_TABLET | Freq: Three times a day (TID) | ORAL | Status: DC
  Administered 2017-12-02 – 2017-12-08 (×19): 600 mg via ORAL
  Filled 2017-12-02 (×20): qty 1

## 2017-12-02 MED ORDER — VANCOMYCIN HCL IN DEXTROSE 1-5 GM/200ML-% IV SOLN
1000.0000 mg | Freq: Two times a day (BID) | INTRAVENOUS | Status: DC
  Filled 2017-12-02: qty 200

## 2017-12-02 MED ORDER — VANCOMYCIN HCL 10 G IV SOLR
2000.0000 mg | Freq: Once | INTRAVENOUS | Status: AC
  Administered 2017-12-02: 2000 mg via INTRAVENOUS
  Filled 2017-12-02: qty 2000

## 2017-12-02 MED ORDER — POTASSIUM CHLORIDE CRYS ER 20 MEQ PO TBCR: 20.0000 meq | EXTENDED_RELEASE_TABLET | ORAL | Status: DC

## 2017-12-02 MED ORDER — SODIUM CHLORIDE 0.9 % IV SOLN
2.0000 g | INTRAVENOUS | Status: DC
  Administered 2017-12-02 – 2017-12-03 (×2): 2 g via INTRAVENOUS
  Filled 2017-12-02 (×4): qty 20

## 2017-12-02 NOTE — Progress Notes (Signed)
[facility] Progress Note Patient Name: Michael Cole DOB: [birth year] MRN: [id number]   Date of Service  12/02/2017  HPI/Events of Note  Temp spike to 102, altered mental status, tachycardia  eICU Interventions  Repeat blood cultures x 2 with temp spike, broaden coverage with Vancomycin pending culture results, hold transfer out of ICU pending PCCM re-evaluation. Head CT, ABG unremarkable. CXR-basilar atelectasis vs pneumonia, low lung volumes. EKG needs f/u     Intervention Category Major Interventions: Change in mental status - evaluation and management;Infection - evaluation and management;Sepsis - evaluation and management  Frederik Pear 12/02/2017, 2:32 AM
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
## 2017-12-02 NOTE — Progress Notes (Signed)
Report given to Jearld Fenton, RN on 6N. Patient to transfer in bed. Patient stable at this time.
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
## 2017-12-02 NOTE — NC FL2 (Signed)
[place] LEVEL OF CARE SCREENING TOOL     IDENTIFICATION  Patient Name: Michael Cole Birthdate: [birth year] Sex: male Admission Date (Current Location): 11/30/2017  [facility] and [place] Number:  [employer] and Address:  [facility] [place] [place] [address] [place], [place] [address]      Provider Number: [id number]  Attending Physician Name and Address:  Chesley Mires, MD  Relative Name and Phone Number:  [name], spouse, [phone]    Current Level of Care: Hospital Recommended Level of Care: [place] Prior Approval Number:    Date Approved/Denied:   PASRR Number: [id number]  Discharge Plan: SNF    Current Diagnoses: Patient Active Problem List   Diagnosis Date Noted  . Pressure injury of skin 12/01/2017  . Sepsis ([place]) 11/30/2017  . Hematuria 11/30/2017  . Thrombocytopenia ([place]) 06/20/2017  . Pharmacologic therapy 06/20/2017  . Insomnia secondary to chronic pain 05/26/2017  . Neuropathic pain 05/26/2017  . Disturbance of skin sensation 05/26/2017  . Muscle spasticity 05/26/2017  . Post-traumatic spasticity 05/26/2017  . Chronic musculoskeletal pain 05/26/2017  . Chronic thoracic back pain (Primary Area of Pain) (midline) 05/26/2017  . Long term prescription benzodiazepine use 05/26/2017  . History of rib fracture (2005) (Left 8th & 9th rib) 05/26/2017  . Abnormal MRI, cervical spine (11/23/2003) 05/26/2017  . Abnormal MRI, thoracic spine (11/23/2003) 05/26/2017  . Abnormal MRI, lumbar spine (11/23/2003) 05/26/2017  . Dextroconvex Thoracic Spinal Kyphoscoliosis 05/26/2017  . 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 Schmorl's nodes of lumbar region (L1) 05/26/2017  . 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 DDD (degenerative disc disease), lumbar (L4-5 and L5-S1) 05/26/2017  . 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 T8 spinal cord  injury, sequela ([place]) 05/26/2017  . Elevated C-reactive protein (CRP) 05/26/2017  . Hypokalemia 05/26/2017  . Paraparesis of both lower limbs ([place]) 05/26/2017  . Vitamin D deficiency 05/06/2017  . Chronic lower extremity pain (Secondary Area of Pain) (Bilateral) (R>L) 04/19/2017  . Chronic low back pain [facility] Area of Pain) (Bilateral) 04/19/2017  . Pressure ulcer of sacral region, stage 1 04/19/2017  . Long term current use of opiate analgesic 04/15/2017  . Long term prescription opiate use 04/15/2017  . Opiate use 04/15/2017  . Chronic pain syndrome 04/15/2017  . 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 Hydronephrosis 09/20/2015  . Muscle weakness (generalized) 06/08/2011    Orientation RESPIRATION BLADDER Height & Weight     Self, Time, Situation, Place  Normal Incontinent, Indwelling catheter Weight: 105.2 kg (231 lb 14.8 oz) Height:  6' (182.9 cm)  BEHAVIORAL SYMPTOMS/MOOD NEUROLOGICAL BOWEL NUTRITION STATUS      Continent Diet(Please see DC Summary)  AMBULATORY STATUS COMMUNICATION OF NEEDS Skin   Limited Assist Verbally PU Stage and Appropriate Care(Stage III on ishcial tuberosity; deep pressure injury on heel)                       Personal Care Assistance Level of Assistance  Bathing, Feeding, Dressing Bathing Assistance: Limited assistance Feeding assistance: Independent Dressing Assistance: Limited assistance     Functional Limitations Info             SPECIAL CARE FACTORS FREQUENCY  PT (By licensed PT)     PT Frequency: 5x/week              Contractures      Additional Factors Info  Code Status, Allergies, Psychotropic Code Status Info: Full Allergies Info: Latex Psychotropic Info: Cymbalta         Current Medications (12/02/2017):  This is the current hospital active medication list Current Facility-Administered Medications  Medication Dose Route Frequency Provider Last Rate Last Dose  . 0.9 %  sodium  chloride infusion  250 mL Intravenous PRN Hammonds, Sharyn Blitz, MD      . acetaminophen (TYLENOL) tablet 650 mg  650 mg Oral Q4H PRN Hammonds, Sharyn Blitz, MD      . albuterol (PROVENTIL) (2.5 MG/3ML) 0.083% nebulizer solution 2.5 mg  2.5 mg Nebulization Q2H PRN Hammonds, Sharyn Blitz, MD      . baclofen (LIORESAL) tablet 20 mg  20 mg Oral QID Darlina Sicilian A, NP   20 mg at 12/02/17 1420  . bisacodyl (DULCOLAX) suppository 10 mg  10 mg Rectal Daily PRN Jennelle Human B, NP      . cefTRIAXone (ROCEPHIN) 2 g in sodium chloride 0.9 % 100 mL IVPB  2 g Intravenous Q24H Chesley Mires, MD      . docusate sodium (COLACE) capsule 100 mg  100 mg Oral BID Hammonds, Sharyn Blitz, MD   100 mg at 12/02/17 0948  . DULoxetine (CYMBALTA) DR capsule 60 mg  60 mg Oral Daily Whiteheart, Kathryn A, NP   60 mg at 12/02/17 0948  . famotidine (PEPCID) tablet 20 mg  20 mg Oral BID Hammonds, Sharyn Blitz, MD   20 mg at 12/02/17 0948  . gabapentin (NEURONTIN) tablet 600 mg  600 mg Oral TID Darlina Sicilian A, NP   600 mg at 12/02/17 1619  . ondansetron (ZOFRAN) injection 4 mg  4 mg Intravenous Q6H PRN Hammonds, Sharyn Blitz, MD      . oxyCODONE (Oxy IR/ROXICODONE) immediate release tablet 10 mg  10 mg Oral Q4H PRN Marijean Heath, NP   10 mg at 12/02/17 1620  . pantoprazole (PROTONIX) EC tablet 40 mg  40 mg Oral Daily Whiteheart, Kathryn A, NP   40 mg at 12/02/17 0951  . senna (SENOKOT) tablet 8.6 mg  1 tablet Oral QHS Hammonds, Sharyn Blitz, MD   8.6 mg at 11/30/17 2159  . vitamin C (ASCORBIC ACID) tablet 500 mg  500 mg Oral BID Marijean Heath, NP   500 mg at 12/02/17 5573     Discharge Medications: Please see discharge summary for a list of discharge medications.  Relevant Imaging Results:  Relevant Lab Results:   Additional Information SSN: [place], [place]

## 2017-12-02 NOTE — Progress Notes (Signed)
Pharmacy Antibiotic Note  Michael Cole is a 52 y.o. male admitted on 11/30/2017 with sepsis and hematuria. Pt is paraplegic with chronic foley. Starting empiric abx for fever spike.   Plan: -Vancomycin 2 g IV x1 then 1g/12h -Ceftriaxone 1 g IV q24h -Monitor renal fx, cultures, VT as indicated  Height: 6' (182.9 cm) Weight: 227 lb 8.2 oz (103.2 kg) IBW/kg (Calculated) : 77.6  Temp (24hrs), Avg:98.6 F (37 C), Min:97.9 F (36.6 C), Max:99.4 F (37.4 C)  Recent Labs  Lab 11/30/17 2322 12/01/17 0454 12/01/17 0703 12/01/17 1023 12/02/17 0013  WBC  --  26.0*  --   --  20.2*  CREATININE 1.00 0.83  --   --  0.69  LATICACIDVEN 2.1*  --  1.3 1.4  --     Estimated Creatinine Clearance: 135.7 mL/min (by C-G formula based on SCr of 0.69 mg/dL).    Allergies  Allergen Reactions  . Latex Rash    Antimicrobials this admission: 3/7 ceftriaxone > 3/7 vancomycin >  Dose adjustments this admission: N/A  Microbiology results: 3/5 urine cx: 3/5 blood cx: 3/5 mrsa pcr: neg   Harvel Quale 12/02/2017 2:42 AM

## 2017-12-02 NOTE — Progress Notes (Signed)
Pt. Began shivering around 0230 12/02/17 with sinus tach. Temp was 98.6. Around 0300, Pt's temp was 102.4. Elink MD notified. Blood cultures and vancomycin was ordered. Ice packs applied, and Tylenol suppository was administerred Temp is 99.9 at 0400.

## 2017-12-02 NOTE — Progress Notes (Signed)
Transferred from 87M this male patient. Alert,oriented x4. IV site in situ, foley catheter infusing well

## 2017-12-02 NOTE — Progress Notes (Signed)
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 He was last seen normal 930-10 pm on 12/01/2017, he was more lethargic and unresponsive to voice at the time code stroke was called out. pt taken for a stat ct of the head, he woke immediately when moved from bed to table. This is more of a encephalopathic exam.

## 2017-12-02 NOTE — Progress Notes (Signed)
Bath was not given to patient due to his mental changes / instability event.

## 2017-12-02 NOTE — Progress Notes (Signed)
Pt. began to have mental status change around 2330 12/01/2017. Pt nonverbal, only responding to aggressive sternal rub. Previously pt was alert and oriented, following commands. Elink MD notified. Code stroke was called around 0000 12/02/17 code team arrived, stat CT of head was done per neurologist. Code stroke cancelled because patient began to follow some commands and CT was negative. However, pt was still lethargic.

## 2017-12-02 NOTE — Evaluation (Signed)
Physical Therapy Evaluation Patient Details Name: Michael Cole MRN: [id number] DOB: 1966-04-22 Today's Date: 12/02/2017   History of Present Illness  SOU NOHR is a 52 y.o. 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  CT negative.   Clinical Impression  Pt admitted with above diagnosis. Pt currently with functional limitations due to the deficits listed below (see PT Problem List). Pt was able to transfer but needed mod tto max assist for lateral scoot transfer. Per wife, has gotten weaker over last few months while wound healing as she provides 24 hour care.  Recommend SNF to incr to prior functional level.  Pt aand wife agree. Pt will benefit from skilled PT to increase their independence and safety with mobility to allow discharge to the venue listed below.      Follow Up Recommendations SNF;Supervision/Assistance - 24 hour    Equipment Recommendations  None recommended by PT    Recommendations for Other Services       Precautions / Restrictions Precautions Precautions: Fall Required Braces or Orthoses: Other Brace/Splint(Darco shoes bilaterally) Restrictions Weight Bearing Restrictions: No      Mobility  Bed Mobility Overal bed mobility: Needs Assistance Bed Mobility: Supine to Sit     Supine to sit: Max assist;+2 for physical assistance;Mod assist     General bed mobility comments: Pt needed incr time and assist to come to EOB with assist for LEs and use of pad.  Pt with significant posterior lean and while pt trying to use UEs, posterior lean incr at times and had to constantly cue and assist for anterior lean with pt needing min to mod assist to trunk stability.   Transfers Overall transfer  level: Needs assistance   Transfers: Lateral/Scoot Transfers          Lateral/Scoot Transfers: Mod assist;Max assist;+2 physical assistance;From elevated surface General transfer comment: Lateral scoot to left drop arm of recliner with use of pad to assist.  Pt had difficulty maintaining anterior lean neding mod to max assist.   Ambulation/Gait                Stairs            Wheelchair Mobility    Modified Rankin (Stroke Patients Only)       Balance Overall balance assessment: Needs assistance Sitting-balance support: Bilateral upper extremity supported;Feet unsupported Sitting balance-Leahy Scale: Poor Sitting balance - Comments: Pt needed mod to max assist to sit EOB as he had significant posterior lean.  Pt does try to use UEs to assist with sitting however weak trunk stability and bil LEs stay extended.  Postural control: Posterior lean                                   Pertinent Vitals/Pain Pain Assessment: No/denies pain    Home Living Family/patient expects to be discharged to:: Private residence Living Arrangements: Spouse/significant other Available Help at Discharge: Family;Available 24 hours/day(wife) Type of Home: House Home Access: Other (comment)(Ambulance transport last few months  due to wound)     Home Layout: One level Home Equipment: Bedside commode;Wheelchair - Scientist, product/process development - 2 wheels  Prior Function Level of Independence: Needs assistance   Gait / Transfers Assistance Needed: Pt was transferring to wheelchair with max assist recently as pt had gotten weak because he had a wound that just healed.  He was walking with cane vs RW prior to this per wife.   ADL's / Homemaking Assistance Needed: sponge bathed        Hand Dominance        Extremity/Trunk Assessment   Upper Extremity Assessment Upper Extremity Assessment: Defer to OT evaluation    Lower Extremity Assessment Lower Extremity  Assessment: RLE deficits/detail;LLE deficits/detail RLE Deficits / Details: grossly 2+/5 except knee limited with flexion.  LLE Deficits / Details: grossly 2+/5    Cervical / Trunk Assessment Cervical / Trunk Assessment: Kyphotic  Communication   Communication: No difficulties  Cognition Arousal/Alertness: Awake/alert Behavior During Therapy: Flat affect Overall Cognitive Status: Within Functional Limits for tasks assessed                                        General Comments      Exercises     Assessment/Plan    PT Assessment Patient needs continued PT services  PT Problem List Decreased strength;Decreased activity tolerance;Decreased mobility;Decreased balance;Decreased knowledge of use of DME;Decreased safety awareness;Decreased knowledge of precautions       PT Treatment Interventions DME instruction;Functional mobility training;Therapeutic exercise;Therapeutic activities;Balance training;Patient/family education;Neuromuscular re-education    PT Goals (Current goals can be found in the Care Plan section)  Acute Rehab PT Goals Patient Stated Goal: to go h ome PT Goal Formulation: With patient/family Time For Goal Achievement: 12/16/17 Potential to Achieve Goals: Good    Frequency Min 2X/week   Barriers to discharge        Co-evaluation               AM-PAC PT "6 Clicks" Daily Activity  Outcome Measure Difficulty turning over in bed (including adjusting bedclothes, sheets and blankets)?: Unable Difficulty moving from lying on back to sitting on the side of the bed? : Unable Difficulty sitting down on and standing up from a chair with arms (e.g., wheelchair, bedside commode, etc,.)?: Unable Help needed moving to and from a bed to chair (including a wheelchair)?: A Lot Help needed walking in hospital room?: Total Help needed climbing 3-5 steps with a railing? : Total 6 Click Score: 7    End of Session Equipment Utilized During Treatment:  Gait belt;Oxygen Activity Tolerance: Patient limited by fatigue Patient left: in chair;with call bell/phone within reach;with chair alarm set Nurse Communication: Mobility status;Need for lift equipment PT Visit Diagnosis: Muscle weakness (generalized) (M62.81)    Time: 0109-3235 PT Time Calculation (min) (ACUTE ONLY): 23 min   Charges:   PT Evaluation $PT Eval Moderate Complexity: 1 Mod PT Treatments $Therapeutic Activity: 8-22 mins   PT G Codes:        Kandie Keiper,PT Acute Rehabilitation [phone] (pager)   Denice Paradise 12/02/2017, 11:25 AM

## 2017-12-03 ENCOUNTER — Encounter (HOSPITAL_COMMUNITY): Payer: Self-pay

## 2017-12-03 DIAGNOSIS — J9601 Acute respiratory failure with hypoxia: Secondary | ICD-10-CM

## 2017-12-03 LAB — BASIC METABOLIC PANEL
Anion gap: 10 (ref 5–15)
BUN: 9 mg/dL (ref 6–20)
CHLORIDE: 109 mmol/L (ref 101–111)
CO2: 24 mmol/L (ref 22–32)
Calcium: 8.9 mg/dL (ref 8.9–10.3)
Creatinine, Ser: 0.56 mg/dL — ABNORMAL LOW (ref 0.61–1.24)
GFR calc Af Amer: >60 mL/min (ref >60)
GFR calc non Af Amer: >60 mL/min (ref >60)
GLUCOSE: 97 mg/dL (ref 65–99)
POTASSIUM: 3 mmol/L — AB (ref 3.5–5.1)
Sodium: 143 mmol/L (ref 135–145)

## 2017-12-03 LAB — MAGNESIUM: Magnesium: 1.5 mg/dL — ABNORMAL LOW (ref 1.7–2.4)

## 2017-12-03 MED ORDER — WHITE PETROLATUM EX OINT
TOPICAL_OINTMENT | CUTANEOUS | Status: AC
  Administered 2017-12-03: 15:00:00
  Filled 2017-12-03: qty 28.35

## 2017-12-03 MED ORDER — POTASSIUM CHLORIDE CRYS ER 20 MEQ PO TBCR
40.0000 meq | EXTENDED_RELEASE_TABLET | Freq: Two times a day (BID) | ORAL | Status: DC
  Administered 2017-12-03 – 2017-12-05 (×5): 40 meq via ORAL
  Filled 2017-12-03 (×5): qty 2

## 2017-12-03 MED ORDER — MAGNESIUM SULFATE 4 GM/100ML IV SOLN
4.0000 g | Freq: Once | INTRAVENOUS | Status: AC
  Administered 2017-12-03: 4 g via INTRAVENOUS
  Filled 2017-12-03: qty 100
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
## 2017-12-03 NOTE — Care Management Important Message (Signed)
Important Message  Patient Details  Name: Michael Cole MRN: [id number] Date of Birth: 01-15-1966   Medicare Important Message Given:  Yes    Orbie Pyo 12/03/2017, 1:07 PM

## 2017-12-03 NOTE — Evaluation (Signed)
Occupational Therapy Evaluation Patient Details Name: Michael Cole MRN: [id number] DOB: 1966/08/17 Today's Date: 12/03/2017    History of Present Illness Michael Cole is a 52 y.o. 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  CT negative.    Clinical Impression   Pt with decline in function and safety with decreased strength, balance endurance and coordination. Pt with jerking/tremors in B hands and having difficulty handling is tray table and eating breakfast this morning. Pt reports that he "hurts all over". PTA, pt's wife provided assist for LB ADL and pt sponge bathed at sink. Pt was able to feed himself, groom and perform UB ADLs with min A.  Pt would benefit from acute OT services to address impairments to maximize level of function and safety    Follow Up Recommendations  SNF    Equipment Recommendations  None recommended by OT;Other (comment)(TBD at next venue of care)    Recommendations for Other Services       Precautions / Restrictions Precautions Precautions: Fall Required Braces or Orthoses: Other Brace/Splint Restrictions Weight Bearing Restrictions: No      Mobility Bed Mobility Overal bed mobility: Needs Assistance             General bed mobility comments: NT, pt struggling eith breakfast tray upon entering room and OT assisted pt with self feeding. Per PT note, pt is max A +2 for bed mobility sup - sit  Transfers Overall transfer level: Needs assistance               General transfer comment: NT, per PT note pt is mod - max A +2 lateral scoot transfer    Balance                                           ADL either performed or assessed with clinical  judgement   ADL Overall ADL's : Needs assistance/impaired Eating/Feeding: Minimal assistance Eating/Feeding Details (indicate cue type and reason): pt with jerking/tremors in B UEs during breakfast dropping food and unable to open containers Grooming: Wash/dry hands;Wash/dry face;Bed level;Sitting;Minimal assistance   Upper Body Bathing: Moderate assistance;Bed level;Sitting Upper Body Bathing Details (indicate cue type and reason): siumulated Lower Body Bathing: Total assistance   Upper Body Dressing : Moderate assistance;Bed level;Sitting   Lower Body Dressing: Total assistance     Toilet Transfer Details (indicate cue type and reason): NT, per PT note pt is mod - max A +2 lateral scoot transfer [place] and Hygiene: Total assistance         General ADL Comments: upon enterring room, pt struggling to set up breakfast tray. Pt required min A with self feeding due to "jerking" in B hands and was droppping food and spilling drink. Pt unable to open containers     Vision Patient Visual Report: No change from baseline       Perception     Praxis      Pertinent Vitals/Pain Pain Assessment: 0-10 Pain Score: 6  Pain Location: "all over" Pain Descriptors / Indicators: Sore;Discomfort;Grimacing;Guarding Pain Intervention(s): Limited activity within patient's tolerance;Monitored during session;Repositioned  Hand Dominance Right   Extremity/Trunk Assessment Upper Extremity Assessment Upper Extremity Assessment: RUE deficits/detail;LUE deficits/detail RUE Deficits / Details: pt with jerking/tremors in B UEs during breakfast dropping food and unable to open containers RUE Coordination: decreased fine motor;decreased gross motor LUE Deficits / Details: pt with jerking/tremors in B UEs during breakfast dropping food and unable to open containers LUE Coordination: decreased fine motor;decreased gross motor   Lower Extremity Assessment Lower Extremity  Assessment: Defer to PT evaluation   Cervical / Trunk Assessment Cervical / Trunk Assessment: Kyphotic   Communication Communication Communication: No difficulties   Cognition Arousal/Alertness: Awake/alert Behavior During Therapy: Flat affect Overall Cognitive Status: Within Functional Limits for tasks assessed                                     General Comments       Exercises     Shoulder Instructions      Home Living Family/patient expects to be discharged to:: Private residence Living Arrangements: Spouse/significant other Available Help at Discharge: Family;Available 24 hours/day Type of Home: House Home Access: (uses ambulance for transport due to wounds)     Home Layout: One level     Bathroom Shower/Tub: Tub/shower unit;Walk-in shower   Bathroom Toilet: Standard     Home Equipment: Bedside commode;Wheelchair - Scientist, product/process development - 2 wheels          Prior Functioning/Environment Level of Independence: Needs assistance  Gait / Transfers Assistance Needed: Pt was transferring to wheelchair with max assist recently as pt had gotten weak because he had a wound that just healed.  He was walking with cane vs RW prior to this per wife.  ADL's / Homemaking Assistance Needed: sponge bathed, wife assisted with LB ADLs            OT Problem List: Decreased strength;Decreased activity tolerance;Decreased coordination;Pain;Impaired balance (sitting and/or standing);Impaired tone;Impaired UE functional use      OT Treatment/Interventions: Self-care/ADL training;DME and/or AE instruction;Therapeutic activities;Therapeutic exercise;Neuromuscular education;Patient/family education    OT Goals(Current goals can be found in the care plan section) Acute Rehab OT Goals Patient Stated Goal: to go home OT Goal Formulation: With patient Time For Goal Achievement: 12/17/17 Potential to Achieve Goals: Good ADL Goals Pt Will Perform Eating: with  supervision;with set-up Pt Will Perform Grooming: with supervision;with set-up;sitting Pt Will Perform Upper Body Bathing: with min assist;sitting Pt Will Perform Upper Body Dressing: with min assist;sitting Additional ADL Goal #1: pt will complete bed mobility with mod A in prep for ADL transfers  OT Frequency: Min 2X/week   Barriers to D/C: Decreased caregiver support  pt and wife planning for SNF rehab after acute d/c       Co-evaluation              AM-PAC PT "6 Clicks" Daily Activity     Outcome Measure Help from another person eating meals?: A Little Help from another person taking care of personal grooming?: A Little Help from another person toileting, which includes using toliet, bedpan, or urinal?: Total Help from another person bathing (including washing, rinsing, drying)?: Total Help from another person to put on and taking off regular upper body clothing?: A Lot Help from another person to put on and taking off regular lower body clothing?: Total 6 Click Score: 11   End of Session    Activity Tolerance: Patient limited by pain Patient left: in bed;with call  bell/phone within reach  OT Visit Diagnosis: Muscle weakness (generalized) (M62.81);Pain;Ataxia, unspecified (R27.0);Other abnormalities of gait and mobility (R26.89) Pain - Right/Left: (generalized) Pain - part of body: ("all over" per pt)                Time: 9574-7340 OT Time Calculation (min): 26 min Charges:  OT General Charges $OT Visit: 1 Visit OT Evaluation $OT Eval Moderate Complexity: 1 Mod G-Codes: OT G-codes **NOT FOR INPATIENT CLASS** Functional Assessment Tool Used: AM-PAC 6 Clicks Daily Activity     Britt Bottom 12/03/2017, 11:40 AM

## 2017-12-04 LAB — MAGNESIUM: MAGNESIUM: 1.9 mg/dL (ref 1.7–2.4)

## 2017-12-04 LAB — BLOOD CULTURE ID PANEL (REFLEXED)
Acinetobacter baumannii: NOT DETECTED
CANDIDA GLABRATA: NOT DETECTED
CANDIDA KRUSEI: NOT DETECTED
CANDIDA PARAPSILOSIS: DETECTED — AB
CANDIDA TROPICALIS: NOT DETECTED
Candida albicans: NOT DETECTED
ENTEROBACTER CLOACAE COMPLEX: NOT DETECTED
ESCHERICHIA COLI: NOT DETECTED
Enterobacteriaceae species: NOT DETECTED
Enterococcus species: NOT DETECTED
Haemophilus influenzae: NOT DETECTED
KLEBSIELLA OXYTOCA: NOT DETECTED
KLEBSIELLA PNEUMONIAE: NOT DETECTED
Listeria monocytogenes: NOT DETECTED
Neisseria meningitidis: NOT DETECTED
PROTEUS SPECIES: NOT DETECTED
PSEUDOMONAS AERUGINOSA: NOT DETECTED
STAPHYLOCOCCUS SPECIES: NOT DETECTED
STREPTOCOCCUS PNEUMONIAE: NOT DETECTED
Serratia marcescens: NOT DETECTED
Staphylococcus aureus (BCID): NOT DETECTED
Streptococcus agalactiae: NOT DETECTED
Streptococcus pyogenes: NOT DETECTED
Streptococcus species: NOT DETECTED

## 2017-12-04 LAB — COMPREHENSIVE METABOLIC PANEL
ALBUMIN: 2.8 g/dL — AB (ref 3.5–5.0)
ALT: 17 U/L (ref 17–63)
ANION GAP: 11 (ref 5–15)
AST: 16 U/L (ref 15–41)
Alkaline Phosphatase: 88 U/L (ref 38–126)
BUN: 11 mg/dL (ref 6–20)
CHLORIDE: 109 mmol/L (ref 101–111)
CO2: 22 mmol/L (ref 22–32)
Calcium: 8.7 mg/dL — ABNORMAL LOW (ref 8.9–10.3)
Creatinine, Ser: 0.55 mg/dL — ABNORMAL LOW (ref 0.61–1.24)
GFR calc Af Amer: >60 mL/min (ref >60)
GFR calc non Af Amer: >60 mL/min (ref >60)
GLUCOSE: 108 mg/dL — AB (ref 65–99)
POTASSIUM: 3.2 mmol/L — AB (ref 3.5–5.1)
SODIUM: 142 mmol/L (ref 135–145)
Total Bilirubin: 1.2 mg/dL (ref 0.3–1.2)
Total Protein: 7 g/dL (ref 6.5–8.1)

## 2017-12-04 LAB — CBC
HCT: 40.7 % (ref 39.0–52.0)
Hemoglobin: 13.4 g/dL (ref 13.0–17.0)
MCH: 30 pg (ref 26.0–34.0)
MCHC: 32.9 g/dL (ref 30.0–36.0)
MCV: 91.1 fL (ref 78.0–100.0)
Platelets: 94 10*3/uL — ABNORMAL LOW (ref 150–400)
RBC: 4.47 MIL/uL (ref 4.22–5.81)
RDW: 15.7 % — AB (ref 11.5–15.5)
WBC: 11.7 10*3/uL — ABNORMAL HIGH (ref 4.0–10.5)

## 2017-12-04 MED ORDER — FLUCONAZOLE IN SODIUM CHLORIDE 400-0.9 MG/200ML-% IV SOLN
800.0000 mg | Freq: Once | INTRAVENOUS | Status: AC
  Administered 2017-12-04: 800 mg via INTRAVENOUS
  Filled 2017-12-04 (×2): qty 400

## 2017-12-04 MED ORDER — FLUCONAZOLE IN SODIUM CHLORIDE 400-0.9 MG/200ML-% IV SOLN
400.0000 mg | INTRAVENOUS | Status: DC
  Administered 2017-12-05: 400 mg via INTRAVENOUS
  Filled 2017-12-04 (×2): qty 200

## 2017-12-04 MED ORDER — CEFUROXIME AXETIL 500 MG PO TABS
500.0000 mg | ORAL_TABLET | Freq: Two times a day (BID) | ORAL | Status: AC
  Administered 2017-12-04 – 2017-12-07 (×6): 500 mg via ORAL
  Filled 2017-12-04 (×6): qty 1

## 2017-12-04 NOTE — Progress Notes (Signed)
Pharmacy Antibiotic Note  Michael Cole is a 52 y.o. male admitted on 11/30/2017 with acute respiratory failure. Patient now has yeast growing in 1/2 blood cultures from 3/5.  Pharmacy has been consulted for fluconazole dosing.WBC trending down and patient afebrile. Baseline LFTS okay.   Plan: Fluconazole 800 mg X 1 then 400 mg every 24 hours Monitor LFTS and cultures   Height: 6' (182.9 cm) Weight: 227 lb 11.8 oz (103.3 kg) IBW/kg (Calculated) : 77.6  Temp (24hrs), Avg:98.4 F (36.9 C), Min:98.1 F (36.7 C), Max:98.7 F (37.1 C)  Recent Labs  Lab 11/30/17 2322 12/01/17 0454 12/01/17 0703 12/01/17 1023 12/02/17 0013 12/02/17 0327 12/03/17 0640 12/04/17 0421  WBC  --  26.0*  --   --  20.2* 14.2*  --  11.7*  CREATININE 1.00 0.83  --   --  0.69 0.67 0.56* 0.55*  LATICACIDVEN 2.1*  --  1.3 1.4  --   --   --   --     Estimated Creatinine Clearance: 135.8 mL/min (A) (by C-G formula based on SCr of 0.55 mg/dL (L)).    Allergies  Allergen Reactions  . Latex Rash      Thank you for allowing pharmacy to be a part of this patient's care.  Jimmy Footman, PharmD, BCPS PGY2 Infectious Diseases Pharmacy Resident Pager: [phone]  12/04/2017 4:52 PM

## 2017-12-04 NOTE — Progress Notes (Signed)
PHARMACY - PHYSICIAN COMMUNICATION CRITICAL VALUE ALERT - BLOOD CULTURE IDENTIFICATION (BCID)  Results for orders placed or performed during the hospital encounter of 11/30/17  Blood Culture ID Panel (Reflexed) (Collected: 11/30/2017 11:25 PM)  Result Value Ref Range   Enterococcus species NOT DETECTED NOT DETECTED   Listeria monocytogenes NOT DETECTED NOT DETECTED   Staphylococcus species NOT DETECTED NOT DETECTED   Staphylococcus aureus NOT DETECTED NOT DETECTED   Streptococcus species NOT DETECTED NOT DETECTED   Streptococcus agalactiae NOT DETECTED NOT DETECTED   Streptococcus pneumoniae NOT DETECTED NOT DETECTED   Streptococcus pyogenes NOT DETECTED NOT DETECTED   Acinetobacter baumannii NOT DETECTED NOT DETECTED   Enterobacteriaceae species NOT DETECTED NOT DETECTED   Enterobacter cloacae complex NOT DETECTED NOT DETECTED   Escherichia coli NOT DETECTED NOT DETECTED   Klebsiella oxytoca NOT DETECTED NOT DETECTED   Klebsiella pneumoniae NOT DETECTED NOT DETECTED   Proteus species NOT DETECTED NOT DETECTED   Serratia marcescens NOT DETECTED NOT DETECTED   Haemophilus influenzae NOT DETECTED NOT DETECTED   Neisseria meningitidis NOT DETECTED NOT DETECTED   Pseudomonas aeruginosa NOT DETECTED NOT DETECTED   Candida albicans NOT DETECTED NOT DETECTED   Candida glabrata NOT DETECTED NOT DETECTED   Candida krusei NOT DETECTED NOT DETECTED   Candida parapsilosis DETECTED (A) NOT DETECTED   Candida tropicalis NOT DETECTED NOT DETECTED    Name of physician (or Provider) Contacted: [place]   Changes to prescribed antibiotics required: Patient already started on fluconazole which should cover candida parapsilosis  Jimmy Footman, PharmD, BCPS PGY2 Infectious Diseases Pharmacy Resident Pager: [phone]  12/04/2017  5:48 PM
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
## 2017-12-05 ENCOUNTER — Inpatient Hospital Stay (HOSPITAL_COMMUNITY): Payer: Medicare HMO

## 2017-12-05 DIAGNOSIS — G8220 Paraplegia, unspecified: Secondary | ICD-10-CM

## 2017-12-05 DIAGNOSIS — T148XXS Other injury of unspecified body region, sequela: Secondary | ICD-10-CM

## 2017-12-05 DIAGNOSIS — B377 Candidal sepsis: Secondary | ICD-10-CM

## 2017-12-05 DIAGNOSIS — I340 Nonrheumatic mitral (valve) insufficiency: Secondary | ICD-10-CM

## 2017-12-05 DIAGNOSIS — F17210 Nicotine dependence, cigarettes, uncomplicated: Secondary | ICD-10-CM

## 2017-12-05 DIAGNOSIS — Z91040 Latex allergy status: Secondary | ICD-10-CM

## 2017-12-05 LAB — BASIC METABOLIC PANEL
ANION GAP: 11 (ref 5–15)
BUN: 11 mg/dL (ref 6–20)
CALCIUM: 8.7 mg/dL — AB (ref 8.9–10.3)
CO2: 21 mmol/L — AB (ref 22–32)
CREATININE: 0.58 mg/dL — AB (ref 0.61–1.24)
Chloride: 107 mmol/L (ref 101–111)
GFR calc non Af Amer: >60 mL/min (ref >60)
Glucose, Bld: 84 mg/dL (ref 65–99)
Potassium: 3.4 mmol/L — ABNORMAL LOW (ref 3.5–5.1)
SODIUM: 139 mmol/L (ref 135–145)

## 2017-12-05 LAB — ECHOCARDIOGRAM COMPLETE
HEIGHTINCHES: 72 in
WEIGHTICAEL: 3641.6 [oz_av]

## 2017-12-05 MED ORDER — POTASSIUM CHLORIDE CRYS ER 20 MEQ PO TBCR
40.0000 meq | EXTENDED_RELEASE_TABLET | Freq: Three times a day (TID) | ORAL | Status: DC
  Administered 2017-12-05 – 2017-12-08 (×10): 40 meq via ORAL
  Filled 2017-12-05 (×10): qty 2

## 2017-12-05 NOTE — Progress Notes (Signed)
  Echocardiogram 2D Echocardiogram has been performed.  Ciena Sampley G Biridiana Twardowski 12/05/2017, 4:00 PM
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
## 2017-12-06 ENCOUNTER — Encounter (HOSPITAL_COMMUNITY): Payer: Self-pay | Admitting: General Practice

## 2017-12-06 LAB — CBC
HCT: 42 % (ref 39.0–52.0)
HEMOGLOBIN: 14.2 g/dL (ref 13.0–17.0)
MCH: 31.4 pg (ref 26.0–34.0)
MCHC: 33.8 g/dL (ref 30.0–36.0)
MCV: 92.9 fL (ref 78.0–100.0)
PLATELETS: 178 10*3/uL (ref 150–400)
RBC: 4.52 MIL/uL (ref 4.22–5.81)
RDW: 15.4 % (ref 11.5–15.5)
WBC: 12.2 10*3/uL — ABNORMAL HIGH (ref 4.0–10.5)

## 2017-12-06 LAB — BASIC METABOLIC PANEL
ANION GAP: 13 (ref 5–15)
BUN: 11 mg/dL (ref 6–20)
CALCIUM: 8.8 mg/dL — AB (ref 8.9–10.3)
CO2: 20 mmol/L — AB (ref 22–32)
CREATININE: 0.63 mg/dL (ref 0.61–1.24)
Chloride: 104 mmol/L (ref 101–111)
Glucose, Bld: 75 mg/dL (ref 65–99)
Potassium: 3.8 mmol/L (ref 3.5–5.1)
Sodium: 137 mmol/L (ref 135–145)

## 2017-12-06 LAB — CULTURE, BLOOD (ROUTINE X 2)
CULTURE: NO GROWTH
SPECIAL REQUESTS: ADEQUATE
Special Requests: ADEQUATE

## 2017-12-06 MED ORDER — FLUCONAZOLE 100 MG PO TABS
400.0000 mg | ORAL_TABLET | Freq: Every day | ORAL | Status: DC
  Administered 2017-12-06 – 2017-12-07 (×2): 400 mg via ORAL
  Filled 2017-12-06 (×2): qty 4

## 2017-12-06 NOTE — Care Management Note (Addendum)
Case Management Note  Patient Details  Name: ELADIO DENTREMONT MRN: [id number] Date of Birth: [birth year]  Subjective/Objective:                    Action/Plan:  Discussed discharge plan with patient at bedside. Discussed PT recommendation of SNF with patient. Patient stated he didn't want to go to SNF because he does not want to share a room. Discussed home with home health . Patient didn't want  through [facility],  and PT only comes three times a week for a hour at the most. Patient will discuss SNF vs Home Health with his wife today. Provided a list of home health agencies for [facility], left at bedside.  Patient already has medical transportation company who takes him to all appointments. He has hospital bed, bedside commode, walker and wheelchair at home.  Patient's home address is [address], [place]  Will continue to follow. Expected Discharge Date:                  Expected Discharge Plan:  [place]  In-House Referral:  Clinical Social Work  Discharge planning Services  CM Consult  Post Acute Care Choice:    Choice offered to:  Patient  DME Arranged:    DME Agency:     HH Arranged:    [place] Agency:     Status of Service:  In process, will continue to follow  If discussed at Long Length of Stay Meetings, dates discussed:    Additional Comments:  Marilu Favre, RN 12/06/2017, 10:48 AM

## 2017-12-06 NOTE — Progress Notes (Signed)
Physical Therapy Treatment Patient Details Name: Michael Cole MRN: [id number] DOB: [birth year] Today's Date: 12/06/2017    History of Present Illness Michael Cole is a 52 y.o. 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  CT negative.     PT Comments    Patient tolerated session and eager to get OOB. Pt reported he plans to go to SNF prior to d/c home. Pt required mod A +2 for A/P transfer. Attempted use of Stedy however pt with limited ROM at pelvis and bilat LE and unable to achieve upright posture. Continue to progress as tolerated with anticipated d/c to SNF for further skilled PT services.     Follow Up Recommendations  SNF;Supervision/Assistance - 24 hour     Equipment Recommendations  None recommended by PT    Recommendations for Other Services       Precautions / Restrictions Precautions Precautions: Fall Required Braces or Orthoses: Other Brace/Splint Other Brace/Splint: bilateral Darco shoes Restrictions Weight Bearing Restrictions: No    Mobility  Bed Mobility Overal bed mobility: Needs Assistance Bed Mobility: Supine to Sit     Supine to sit: +2 for physical assistance;Mod assist;HOB elevated     General bed mobility comments: cues for sequencing, technique, and use of rails; use of bed pad to bring hips toward EOB  Transfers Overall transfer level: Needs assistance   Transfers: Anterior-Posterior Transfer;Sit to/from Stand Sit to Stand: Max assist;From elevated surface     Anterior-Posterior transfers: Mod assist;+2 physical assistance   General transfer comment: sit to stand with use of Stedy; pt unable to achieve upright posture and required return to sitting EOB; A/P transfer  with use of bed pad and +2 to scoot hips with cues for hand placement and sequencing  Ambulation/Gait                 Stairs            Wheelchair Mobility    Modified Rankin (Stroke Patients Only)       Balance Overall balance assessment: Needs assistance Sitting-balance support: Bilateral upper extremity supported;Feet unsupported Sitting balance-Leahy Scale: Poor Sitting balance - Comments: max A initially and progressed to min guard assist for sitting EOB Postural control: Posterior lean   Standing balance-Leahy Scale: Zero                              Cognition Arousal/Alertness: Awake/alert Behavior During Therapy: Flat affect Overall Cognitive Status: Within Functional Limits for tasks assessed                                        Exercises      General Comments        Pertinent Vitals/Pain Pain Assessment: Faces Faces Pain Scale: Hurts little more Pain Location: "all over" Pain Descriptors / Indicators: Sore Pain Intervention(s): Limited activity within patient's tolerance;Monitored during session;Repositioned    Home Living                      Prior Function  PT Goals (current goals can now be found in the care plan section) Acute Rehab PT Goals Patient Stated Goal: go to rehab before home PT Goal Formulation: With patient/family Time For Goal Achievement: 12/16/17 Potential to Achieve Goals: Good Progress towards PT goals: Progressing toward goals    Frequency    Min 2X/week      PT Plan Current plan remains appropriate    Co-evaluation              AM-PAC PT "6 Clicks" Daily Activity  Outcome Measure  Difficulty turning over in bed (including adjusting bedclothes, sheets and blankets)?: Unable Difficulty moving from lying on back to sitting on the side of the bed? : Unable Difficulty sitting down on and standing up from a chair with arms (e.g., wheelchair, bedside  commode, etc,.)?: Unable Help needed moving to and from a bed to chair (including a wheelchair)?: A Lot Help needed walking in hospital room?: Total Help needed climbing 3-5 steps with a railing? : Total 6 Click Score: 7    End of Session Equipment Utilized During Treatment: Gait belt Activity Tolerance: Patient tolerated treatment well Patient left: in chair;with call bell/phone within reach Nurse Communication: Mobility status;Need for lift equipment;Other (comment)(NT educated in return A/P transfer) PT Visit Diagnosis: Muscle weakness (generalized) (M62.81)     Time: 6063-0160 PT Time Calculation (min) (ACUTE ONLY): 23 min  Charges:  $Therapeutic Activity: 23-37 mins                    G Codes:       Earney Navy, PTA Pager: [phone]     Darliss Cheney 12/06/2017, 4:19 PM

## 2017-12-06 NOTE — Progress Notes (Signed)
Pharmacy Antibiotic Note  THAI BURGUENO is a 52 y.o. male admitted on 11/30/2017 with acute respiratory failure. Patient has candida parapsilosis in 1/4 BCx from 3/5, repeat BCx negative to date. Pharmacy has been consulted for fluconazole dosing to continue 14 days from 3/9 - ID ok'd MD to change to PO today. WBC up to 12.2, afebrile. LFTs wnl.  Plan: Change to fluconazole 400mg  PO daily per MD request, confirmed with ID per consult Monitor LFTS and cultures TTE pending, outpatient ophthalmology consult  Height: 6' (182.9 cm) Weight: 224 lb 3.3 oz (101.7 kg) IBW/kg (Calculated) : 77.6  Temp (24hrs), Avg:98.1 F (36.7 C), Min:97.8 F (36.6 C), Max:98.6 F (37 C)  Recent Labs  Lab 11/30/17 2322 12/01/17 0454 12/01/17 0703 12/01/17 1023 12/02/17 0013 12/02/17 0327 12/03/17 0640 12/04/17 0421 12/05/17 0856 12/06/17 0436  WBC  --  26.0*  --   --  20.2* 14.2*  --  11.7*  --  12.2*  CREATININE 1.00 0.83  --   --  0.69 0.67 0.56* 0.55* 0.58* 0.63  LATICACIDVEN 2.1*  --  1.3 1.4  --   --   --   --   --   --     Estimated Creatinine Clearance: 134.7 mL/min (by C-G formula based on SCr of 0.63 mg/dL).    Allergies  Allergen Reactions  . Latex Rash     Elicia Lamp, PharmD, BCPS Clinical Pharmacist Clinical phone for 12/06/2017 until 3:30pm: [phone] If after 3:30pm, please call main pharmacy at: [phone] 12/06/2017 2:20 PM
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
## 2017-12-06 NOTE — Consult Note (Signed)
[facility] CM Primary Care Navigator  12/06/2017  Michael Cole December 13, 1965 [id number]   Met withpatient at the bedside to identify possibledischarge needs.  Patient reportshavingurethral bleeding after being unable to change his chronic indwelling foley catheter that had led to this admission.  Patient endorses Dr.Xaje Hasanaj with [facility] as the primary care provider.   [facility] [place] [facility]   Patient verbalized thatwife [name]) manages his medications at home.  Patient mentioned using [place] medical transportation company to take him to all his doctors' appointments.  Wife is theprimary caregiver at home as stated and son Bland Span- lives close by) assists with his care needs as well.   Discharge plan remains to be determinedpending patient/ family decision.Per MD note, patient is stable for discharge to skilled nursing facility (SNF) versus home based on patient's/family's decision.  Patient is awaiting to discuss with his wife who is not well and is at the doctor's office.  Patientvoiced understanding to call primary care provider's office when he returns back home for a post discharge follow-up appointment within 1- 2 weeks or sooner if needs arise.Patient letter (with PCP's contact number) wasprovided as areminder.  Explained to patientregardingTHN CM services available for healthmanagementand resources at home but he denies any current needs or concerns at this time. Patient expressed understanding to seekreferral to [facility] care managementfrom primary care provider ifdeemed necessary andappropriatefor services in the future.   [facility] care management information provided for any future needs that [facility].   For additional questions please contact:  Edwena Felty A. Aubry Tucholski, BSN, RN-BC [facility] PRIMARY CARE Navigator Cell: [phone]

## 2017-12-06 NOTE — Social Work (Addendum)
Pt declining SNF, "i'm not going to a nursing home, unless I can go to inpatient I want to go home."  RN Case Manager alerted.   3:32pm- CSW spoke with pt alongside RN Case Manager, he is leaning towards accepting placement at [facility], aware that he may not have private room.  Have let [facility] know.   5:29pm- CSW stopped by room at RN request, pt states that he is okay with [facility]. Pt then talked about hoping his wife would call as she had just gotten home from a MD appointment herself. CSW stated that she would f/u in the morning. Pt then received a call from wife, asked CSW to stay in room and then proceeded to tell his wife that he is going to get ready and then go home. CSW then proceeded to let pt know that MD would round on him in the morning and CSW/RN Case Manager would support whatever decision he wants.   Pt options are: 1. [facility] who has accepted pt and started insurance authorization  2. Go home with home health therapies arranged by RN Case Manager  CSW will f/u in the morning.   Alexander Mt, [place] Work [phone]
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
## 2017-12-06 NOTE — Social Work (Signed)
Pt transferred to 6N, per PT notes pt would like SNF post discharge, continuing to follow.  Alexander Mt, [place] Work [phone]

## 2017-12-07 LAB — CULTURE, BLOOD (ROUTINE X 2)
CULTURE: NO GROWTH
Culture: NO GROWTH
SPECIAL REQUESTS: ADEQUATE
Special Requests: ADEQUATE

## 2017-12-07 MED ORDER — BISACODYL 10 MG RE SUPP: 10.0000 mg | Freq: Every day | RECTAL | 0 refills | Status: AC | PRN

## 2017-12-07 MED ORDER — DULOXETINE HCL 60 MG PO CPEP: 60.0000 mg | ORAL_CAPSULE | Freq: Every day | ORAL | 0 refills | Status: AC

## 2017-12-07 MED ORDER — OXYCODONE HCL 10 MG PO TABS: 10.0000 mg | ORAL_TABLET | ORAL | 0 refills | Status: AC | PRN

## 2017-12-07 MED ORDER — FLUCONAZOLE 100 MG PO TABS: 100.0000 mg | ORAL_TABLET | Freq: Every day | ORAL | 0 refills | Status: DC

## 2017-12-07 MED ORDER — SENNA 8.6 MG PO TABS: 1.0000 | ORAL_TABLET | Freq: Every day | ORAL | 0 refills | Status: AC

## 2017-12-07 MED ORDER — XTAMPZA ER 18 MG PO C12A: 18.0000 mg | EXTENDED_RELEASE_CAPSULE | Freq: Two times a day (BID) | ORAL | 0 refills | Status: AC
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
## 2017-12-07 NOTE — Progress Notes (Addendum)
ED evening CSW received text message from [place] from [employer]. [place] [facility] [place] [facility] [place] will accept 10-14 day LOG with therapy for pt. Shaquina asked for an offer to be extended to that SNF.   CSW relayed information to Surveyor, quantity of Social Work. CSW is waiting for confirmation that a 10-14 day LOG can be done for [facility]. CSW is waiting to hear back if this LOG can be done before speaking with pt about this option.  Shaquina from [place] stated in order to accept pt tonight, needs a LOG for 10-14 days with therapy from Surveyor, quantity.    CSW left voice message for pt's wife to discuss update with pt's discharge plans. CSW asked for call back. CSW will continue to follow for discharge needs.   Update: CSW spoke with pt. Pt is open to placement. CSW explained that pt is likely to not be discharged tonight because no SNF as of now. PT is not open to home health.   CSW spoke with Surveyor, quantity. A LOG for 14 days with therapy cannot be done. Pt will need placement with Medicaid.   Wendelyn Breslow, [facility]  [phone]

## 2017-12-07 NOTE — Social Work (Addendum)
CSW spoke with liaison from [facility], unable to take 5 day LOG due to [facility] authorization not being an option for pt SNF stay. [facility] reviewing if they are able to take 30 day Medicaid LOG.   5:34pm- CSW still does not have determination from [facility] regarding SNF placement. Floor CSW has alerted 2nd shift CSW in ED to monitor facility response. Pt has no other options that would take pt under Medicaid. RN Case Manager in ED will also follow if pt has to discharge home.   Please call [phone], ED CSW to follow. RN Case Manager also following in case pt is not able to be placed at SNF.   Alexander Mt, [place] Work [phone]

## 2017-12-07 NOTE — Care Management Note (Addendum)
Case Management Note  Patient Details  Name: Michael Cole MRN: [id number] Date of Birth: 01-Mar-1966  Subjective/Objective:                    Action/Plan:  Spoke with patient's wife Michael Cole [phone] regarding discharge planning. Discussed options , [name] wants patient to go [facility] in [place] for short term rehab.MD and SW aware Expected Discharge Date:                  Expected Discharge Plan:  [place]  In-House Referral:  Clinical Social Work  Discharge planning Services  CM Consult  Post Acute Care Choice:    Choice offered to:  Patient, Spouse  DME Arranged:  N/A DME Agency:  NA  HH Arranged:  NA HH Agency:  NA  Status of Service:  Completed, signed off  If discussed at [place] of Stay Meetings, dates discussed:    Additional Comments:  Marilu Favre, RN 12/07/2017, 10:35 AM

## 2017-12-08 ENCOUNTER — Encounter (HOSPITAL_COMMUNITY): Payer: Self-pay

## 2017-12-08 MED ORDER — ENSURE ENLIVE PO LIQD
237.0000 mL | Freq: Two times a day (BID) | ORAL | Status: DC
  Administered 2017-12-08: 237 mL via ORAL

## 2017-12-08 MED ORDER — ADULT MULTIVITAMIN W/MINERALS CH
1.0000 | ORAL_TABLET | Freq: Every day | ORAL | Status: DC
  Administered 2017-12-08: 1 via ORAL
  Filled 2017-12-08: qty 1

## 2017-12-08 MED ORDER — FLUCONAZOLE 200 MG PO TABS: 400.0000 mg | ORAL_TABLET | Freq: Every day | ORAL | 0 refills | Status: AC

## 2017-12-08 NOTE — Social Work (Addendum)
CSW has called to multiple facilities in regards to taking pt under Medicaid benefits, [facility] has f/u to extend bed offer with 30 day LOG. Sent pt discharge summary and clinicals to facility.   Pt and pt wife in agreement for transfer to [facility] today. CSW gave address of facility to wife as well as phone number. Encouraged her to call facility with any further questions.   3:25pm- Clinical Social Worker facilitated patient discharge including contacting patient family and facility to confirm patient discharge plans.  Clinical information faxed to facility and family agreeable with plan.  CSW arranged ambulance transport via [facility] to [facility]. RN to call [phone] with report prior to discharge.  Clinical Social Worker will sign off for now as social work intervention is no longer needed. Please consult [place] again if new need arises.  Alexander Mt, [place] Clinical Social Worker   Alexander Mt, [place] Work [phone]

## 2017-12-08 NOTE — Progress Notes (Signed)
Report given to [facility] [place].
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
## 2017-12-08 NOTE — Progress Notes (Signed)
Occupational Therapy Treatment Patient Details Name: Michael Cole MRN: [id number] DOB: 1966/09/28 Today's Date: 12/08/2017    History of present illness Michael Cole is a 52 y.o. 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  CT negative.    OT comments  Focus of session on A-P transfer technique for OOB to chair. Pt required mod assist +2 for transfer and bed mobility this session with cues for sequencing and hand placement throughout. Reviewed education on pressure relief and chair pushups for UB strengthening; pt able to perform x3. D/c plan remains appropriate. Will continue to follow acutely.   Follow Up Recommendations  SNF    Equipment Recommendations  Other (comment)(TBD at next venue)    Recommendations for Other Services      Precautions / Restrictions Precautions Precautions: Fall Restrictions Weight Bearing Restrictions: No       Mobility Bed Mobility Overal bed mobility: Needs Assistance Bed Mobility: Supine to Sit     Supine to sit: Mod assist     General bed mobility comments: Assist for LEs to EOB and light assist for trunk elevation to sitting. Cues for sequencing and technique  Transfers Overall transfer level: Needs assistance   Transfers: Anterior-Posterior Transfer       Anterior-Posterior transfers: Mod assist;+2 physical assistance   General transfer comment: Cues for sequencing and hand placement. Assist for LEs and scooting with use of bed pad posteriorly to chair    Balance Overall balance assessment: Needs assistance Sitting-balance support: Bilateral upper extremity supported Sitting balance-Leahy Scale: Poor                                      ADL either performed or assessed with clinical judgement   ADL Overall ADL's : Needs assistance/impaired                     Lower Body Dressing: Total assistance;Bed level   Toilet Transfer: Moderate assistance;+2 for physical assistance(A-P transfer OOB to chair)           Functional mobility during ADLs: Moderate assistance(A-P transfer) General ADL Comments: Educated pt on chair pushups for pressure relief and UB strengthening in chair; pt verbalized understanding     Vision       Perception     Praxis      Cognition Arousal/Alertness: Awake/alert Behavior During Therapy: WFL for tasks assessed/performed Overall Cognitive Status: Within Functional Limits for tasks assessed                                          Exercises     Shoulder Instructions       General Comments      Pertinent Vitals/ Pain       Pain Assessment: Faces Faces Pain Scale: Hurts a little bit Pain Location: lower back Pain Descriptors / Indicators: Sore Pain Intervention(s): Monitored during session;Repositioned  Home Living  Prior Functioning/Environment              Frequency  Min 2X/week        Progress Toward Goals  OT Goals(current goals can now be found in the care plan section)  Progress towards OT goals: Progressing toward goals  Acute Rehab OT Goals Patient Stated Goal: go to rehab before home OT Goal Formulation: With patient  Plan Discharge plan remains appropriate    Co-evaluation                 AM-PAC PT "6 Clicks" Daily Activity     Outcome Measure   Help from another person eating meals?: A Little Help from another person taking care of personal grooming?: A Little Help from another person toileting, which includes using toliet, bedpan, or urinal?: Total Help from another person bathing (including washing, rinsing, drying)?: A Lot Help from another  person to put on and taking off regular upper body clothing?: A Lot Help from another person to put on and taking off regular lower body clothing?: Total 6 Click Score: 12    End of Session    OT Visit Diagnosis: Muscle weakness (generalized) (M62.81);Pain;Ataxia, unspecified (R27.0);Other abnormalities of gait and mobility (R26.89) Pain - part of body: (back)   Activity Tolerance Patient tolerated treatment well   Patient Left in chair;with call bell/phone within reach   Nurse Communication          Time: 0174-9449 OT Time Calculation (min): 11 min  Charges: OT General Charges $OT Visit: 1 Visit OT Treatments $Therapeutic Activity: 8-22 mins  Airyn Ellzey A. Ulice Brilliant, M.S., OTR/L Pager: [place] 12/08/2017, 2:09 PM
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
## 2017-12-08 NOTE — Clinical Social Work Placement (Addendum)
   CLINICAL SOCIAL WORK PLACEMENT  NOTE [facility]  [phone] for RN to call report   Date:  12/08/2017  Patient Details  Name: Michael Cole MRN: [id number] Date of Birth: Jul 25, 1966  Clinical Social Work is seeking post-discharge placement for this patient at the [place] level of care (*CSW will initial, date and re-position this form in  chart as items are completed):      Patient/family provided with [place] Work Department's list of facilities offering this level of care within the geographic area requested by the patient (or if unable, by the patient's family).      Patient/family informed of their freedom to choose among providers that offer the needed level of care, that participate in Medicare, Medicaid or managed care program needed by the patient, have an available bed and are willing to accept the patient.      Patient/family informed of [facility]'s ownership interest in [facility] and [facility], as well as of the fact that they are under no obligation to receive care at these facilities.  PASRR submitted to EDS on       PASRR number received on       Existing PASRR number confirmed on 12/02/17     FL2 transmitted to all facilities in geographic area requested by pt/family on 12/02/17     FL2 transmitted to all facilities within larger geographic area on       Patient informed that his/her managed care company has contracts with or will negotiate with certain facilities, including the following:        Yes   Patient/family informed of bed offers received.  Patient chooses bed at Other - please specify in the comment section below:([facility])     Physician recommends and patient chooses bed at      Patient to be transferred to Other - please specify in the comment section below:([facility]) on 12/08/17.  Patient to be transferred to facility by [facility]       Patient family notified on 12/08/17 of transfer.  Name of family member notified:  Michael Cole, wife     PHYSICIAN       Additional Comment:    _______________________________________________ Alexander Mt, [place] 12/08/2017, 3:27 PM

## 2017-12-14 DIAGNOSIS — G8929 Other chronic pain: Secondary | ICD-10-CM | POA: Diagnosis not present

## 2017-12-14 DIAGNOSIS — R197 Diarrhea, unspecified: Secondary | ICD-10-CM | POA: Diagnosis not present

## 2017-12-15 DIAGNOSIS — G8220 Paraplegia, unspecified: Secondary | ICD-10-CM | POA: Diagnosis not present

## 2017-12-15 DIAGNOSIS — D649 Anemia, unspecified: Secondary | ICD-10-CM | POA: Diagnosis not present

## 2017-12-15 DIAGNOSIS — J449 Chronic obstructive pulmonary disease, unspecified: Secondary | ICD-10-CM | POA: Diagnosis not present

## 2017-12-15 DIAGNOSIS — R319 Hematuria, unspecified: Secondary | ICD-10-CM | POA: Diagnosis not present

## 2017-12-15 DIAGNOSIS — I10 Essential (primary) hypertension: Secondary | ICD-10-CM | POA: Diagnosis not present

## 2017-12-15 DIAGNOSIS — A419 Sepsis, unspecified organism: Secondary | ICD-10-CM | POA: Diagnosis not present

## 2017-12-27 DIAGNOSIS — I10 Essential (primary) hypertension: Secondary | ICD-10-CM | POA: Diagnosis not present

## 2017-12-27 DIAGNOSIS — Z9181 History of falling: Secondary | ICD-10-CM | POA: Diagnosis not present

## 2017-12-27 DIAGNOSIS — R569 Unspecified convulsions: Secondary | ICD-10-CM | POA: Diagnosis not present

## 2017-12-27 DIAGNOSIS — K219 Gastro-esophageal reflux disease without esophagitis: Secondary | ICD-10-CM | POA: Diagnosis not present

## 2017-12-27 DIAGNOSIS — R69 Illness, unspecified: Secondary | ICD-10-CM | POA: Diagnosis not present

## 2017-12-27 DIAGNOSIS — M6281 Muscle weakness (generalized): Secondary | ICD-10-CM | POA: Diagnosis not present

## 2018-01-05 ENCOUNTER — Telehealth (HOSPITAL_COMMUNITY): Payer: Self-pay | Admitting: Occupational Therapy

## 2018-01-05 ENCOUNTER — Ambulatory Visit (HOSPITAL_COMMUNITY): Payer: Medicare Other | Admitting: Occupational Therapy

## 2018-01-05 NOTE — Telephone Encounter (Signed)
Patient is in [place] in rehab and is not sure when he will get out. He will call us later to reschedule his appt

## 2018-01-06 ENCOUNTER — Encounter (HOSPITAL_COMMUNITY): Payer: Self-pay

## 2018-01-06 ENCOUNTER — Ambulatory Visit (HOSPITAL_COMMUNITY): Payer: Medicare Other | Admitting: Occupational Therapy

## 2018-01-11 DIAGNOSIS — R6889 Other general symptoms and signs: Secondary | ICD-10-CM | POA: Diagnosis not present

## 2018-01-11 DIAGNOSIS — R39191 Need to immediately re-void: Secondary | ICD-10-CM | POA: Diagnosis not present

## 2018-01-11 DIAGNOSIS — R748 Abnormal levels of other serum enzymes: Secondary | ICD-10-CM | POA: Diagnosis not present

## 2018-01-11 DIAGNOSIS — R109 Unspecified abdominal pain: Secondary | ICD-10-CM | POA: Diagnosis not present

## 2018-01-11 DIAGNOSIS — R7989 Other specified abnormal findings of blood chemistry: Secondary | ICD-10-CM | POA: Diagnosis not present

## 2018-01-12 DIAGNOSIS — E876 Hypokalemia: Secondary | ICD-10-CM | POA: Diagnosis not present

## 2018-01-12 DIAGNOSIS — K5900 Constipation, unspecified: Secondary | ICD-10-CM | POA: Diagnosis not present

## 2018-01-12 DIAGNOSIS — I10 Essential (primary) hypertension: Secondary | ICD-10-CM | POA: Diagnosis not present

## 2018-01-12 DIAGNOSIS — R109 Unspecified abdominal pain: Secondary | ICD-10-CM | POA: Diagnosis not present

## 2018-01-14 DIAGNOSIS — N319 Neuromuscular dysfunction of bladder, unspecified: Secondary | ICD-10-CM | POA: Diagnosis not present

## 2018-01-14 DIAGNOSIS — N390 Urinary tract infection, site not specified: Secondary | ICD-10-CM | POA: Diagnosis not present

## 2018-01-17 DIAGNOSIS — K5900 Constipation, unspecified: Secondary | ICD-10-CM | POA: Diagnosis not present

## 2018-01-17 DIAGNOSIS — G8929 Other chronic pain: Secondary | ICD-10-CM | POA: Diagnosis not present

## 2018-01-17 DIAGNOSIS — N319 Neuromuscular dysfunction of bladder, unspecified: Secondary | ICD-10-CM | POA: Diagnosis not present

## 2018-01-17 DIAGNOSIS — G8220 Paraplegia, unspecified: Secondary | ICD-10-CM | POA: Diagnosis not present

## 2018-01-19 DIAGNOSIS — Z466 Encounter for fitting and adjustment of urinary device: Secondary | ICD-10-CM | POA: Diagnosis not present

## 2018-01-19 DIAGNOSIS — R300 Dysuria: Secondary | ICD-10-CM | POA: Diagnosis not present

## 2018-01-19 DIAGNOSIS — G8220 Paraplegia, unspecified: Secondary | ICD-10-CM | POA: Diagnosis not present

## 2018-01-19 DIAGNOSIS — I10 Essential (primary) hypertension: Secondary | ICD-10-CM | POA: Diagnosis not present

## 2018-01-19 DIAGNOSIS — D649 Anemia, unspecified: Secondary | ICD-10-CM | POA: Diagnosis not present

## 2018-01-19 DIAGNOSIS — K5900 Constipation, unspecified: Secondary | ICD-10-CM | POA: Diagnosis not present

## 2018-01-19 DIAGNOSIS — Z91040 Latex allergy status: Secondary | ICD-10-CM | POA: Diagnosis not present

## 2018-01-19 DIAGNOSIS — G8929 Other chronic pain: Secondary | ICD-10-CM | POA: Diagnosis not present

## 2018-01-19 DIAGNOSIS — J449 Chronic obstructive pulmonary disease, unspecified: Secondary | ICD-10-CM | POA: Diagnosis not present

## 2018-01-20 DIAGNOSIS — D519 Vitamin B12 deficiency anemia, unspecified: Secondary | ICD-10-CM | POA: Diagnosis not present

## 2018-01-26 DIAGNOSIS — K2970 Gastritis, unspecified, without bleeding: Secondary | ICD-10-CM | POA: Diagnosis not present

## 2018-01-26 DIAGNOSIS — R10814 Left lower quadrant abdominal tenderness: Secondary | ICD-10-CM | POA: Diagnosis not present

## 2018-01-26 DIAGNOSIS — G8220 Paraplegia, unspecified: Secondary | ICD-10-CM | POA: Diagnosis not present

## 2018-01-26 DIAGNOSIS — T83098A Other mechanical complication of other indwelling urethral catheter, initial encounter: Secondary | ICD-10-CM | POA: Diagnosis not present

## 2018-01-26 DIAGNOSIS — N390 Urinary tract infection, site not specified: Secondary | ICD-10-CM | POA: Diagnosis not present

## 2018-01-26 DIAGNOSIS — T83018A Breakdown (mechanical) of other indwelling urethral catheter, initial encounter: Secondary | ICD-10-CM | POA: Diagnosis not present

## 2018-01-28 DIAGNOSIS — K219 Gastro-esophageal reflux disease without esophagitis: Secondary | ICD-10-CM | POA: Diagnosis not present

## 2018-01-28 DIAGNOSIS — D509 Iron deficiency anemia, unspecified: Secondary | ICD-10-CM | POA: Diagnosis not present

## 2018-01-28 DIAGNOSIS — R69 Illness, unspecified: Secondary | ICD-10-CM | POA: Diagnosis not present

## 2018-01-28 DIAGNOSIS — G8220 Paraplegia, unspecified: Secondary | ICD-10-CM | POA: Diagnosis not present

## 2018-02-11 DIAGNOSIS — N390 Urinary tract infection, site not specified: Secondary | ICD-10-CM | POA: Diagnosis not present

## 2018-02-11 DIAGNOSIS — R279 Unspecified lack of coordination: Secondary | ICD-10-CM | POA: Diagnosis not present

## 2018-02-11 DIAGNOSIS — Z7401 Bed confinement status: Secondary | ICD-10-CM | POA: Diagnosis not present

## 2018-02-11 DIAGNOSIS — K219 Gastro-esophageal reflux disease without esophagitis: Secondary | ICD-10-CM | POA: Diagnosis not present

## 2018-02-11 DIAGNOSIS — K5909 Other constipation: Secondary | ICD-10-CM | POA: Diagnosis not present

## 2018-02-11 DIAGNOSIS — R69 Illness, unspecified: Secondary | ICD-10-CM | POA: Diagnosis not present

## 2018-02-11 DIAGNOSIS — K5900 Constipation, unspecified: Secondary | ICD-10-CM | POA: Diagnosis not present

## 2018-02-11 DIAGNOSIS — Z79899 Other long term (current) drug therapy: Secondary | ICD-10-CM | POA: Diagnosis not present

## 2018-02-11 DIAGNOSIS — R1013 Epigastric pain: Secondary | ICD-10-CM | POA: Diagnosis not present

## 2018-02-11 DIAGNOSIS — N200 Calculus of kidney: Secondary | ICD-10-CM | POA: Diagnosis not present

## 2018-02-11 DIAGNOSIS — Z87442 Personal history of urinary calculi: Secondary | ICD-10-CM | POA: Diagnosis not present

## 2018-02-11 DIAGNOSIS — N3000 Acute cystitis without hematuria: Secondary | ICD-10-CM | POA: Diagnosis not present

## 2018-02-11 DIAGNOSIS — K810 Acute cholecystitis: Secondary | ICD-10-CM | POA: Diagnosis not present

## 2018-02-11 DIAGNOSIS — R531 Weakness: Secondary | ICD-10-CM | POA: Diagnosis not present

## 2018-02-11 DIAGNOSIS — S24109S Unspecified injury at unspecified level of thoracic spinal cord, sequela: Secondary | ICD-10-CM | POA: Diagnosis not present

## 2018-02-11 DIAGNOSIS — K8051 Calculus of bile duct without cholangitis or cholecystitis with obstruction: Secondary | ICD-10-CM | POA: Diagnosis not present

## 2018-02-11 DIAGNOSIS — G8929 Other chronic pain: Secondary | ICD-10-CM | POA: Diagnosis not present

## 2018-02-11 DIAGNOSIS — R05 Cough: Secondary | ICD-10-CM | POA: Diagnosis not present

## 2018-02-12 DIAGNOSIS — R531 Weakness: Secondary | ICD-10-CM | POA: Diagnosis not present

## 2018-02-12 DIAGNOSIS — R05 Cough: Secondary | ICD-10-CM | POA: Diagnosis not present

## 2018-02-12 DIAGNOSIS — K5909 Other constipation: Secondary | ICD-10-CM | POA: Diagnosis not present

## 2018-02-12 DIAGNOSIS — K810 Acute cholecystitis: Secondary | ICD-10-CM | POA: Diagnosis not present

## 2018-02-12 DIAGNOSIS — N390 Urinary tract infection, site not specified: Secondary | ICD-10-CM | POA: Diagnosis not present

## 2018-02-13 DIAGNOSIS — K810 Acute cholecystitis: Secondary | ICD-10-CM | POA: Diagnosis not present

## 2018-02-13 DIAGNOSIS — N390 Urinary tract infection, site not specified: Secondary | ICD-10-CM | POA: Diagnosis not present

## 2018-02-13 DIAGNOSIS — K5909 Other constipation: Secondary | ICD-10-CM | POA: Diagnosis not present

## 2018-02-13 DIAGNOSIS — R531 Weakness: Secondary | ICD-10-CM | POA: Diagnosis not present

## 2018-02-14 DIAGNOSIS — Z7401 Bed confinement status: Secondary | ICD-10-CM | POA: Diagnosis not present

## 2018-02-14 DIAGNOSIS — K810 Acute cholecystitis: Secondary | ICD-10-CM | POA: Diagnosis not present

## 2018-02-14 DIAGNOSIS — N390 Urinary tract infection, site not specified: Secondary | ICD-10-CM | POA: Diagnosis not present

## 2018-02-14 DIAGNOSIS — K5909 Other constipation: Secondary | ICD-10-CM | POA: Diagnosis not present

## 2018-02-14 DIAGNOSIS — R279 Unspecified lack of coordination: Secondary | ICD-10-CM | POA: Diagnosis not present

## 2018-02-14 DIAGNOSIS — R531 Weakness: Secondary | ICD-10-CM | POA: Diagnosis not present

## 2018-02-14 DIAGNOSIS — K8051 Calculus of bile duct without cholangitis or cholecystitis with obstruction: Secondary | ICD-10-CM | POA: Diagnosis not present

## 2018-02-22 DIAGNOSIS — R279 Unspecified lack of coordination: Secondary | ICD-10-CM | POA: Diagnosis not present

## 2018-02-22 DIAGNOSIS — Z743 Need for continuous supervision: Secondary | ICD-10-CM | POA: Diagnosis not present

## 2018-02-28 DIAGNOSIS — Z743 Need for continuous supervision: Secondary | ICD-10-CM | POA: Diagnosis not present

## 2018-02-28 DIAGNOSIS — R279 Unspecified lack of coordination: Secondary | ICD-10-CM | POA: Diagnosis not present

## 2018-02-28 DIAGNOSIS — M5134 Other intervertebral disc degeneration, thoracic region: Secondary | ICD-10-CM | POA: Diagnosis not present

## 2018-02-28 DIAGNOSIS — R0902 Hypoxemia: Secondary | ICD-10-CM | POA: Diagnosis not present

## 2018-02-28 DIAGNOSIS — Z79899 Other long term (current) drug therapy: Secondary | ICD-10-CM | POA: Diagnosis not present

## 2018-02-28 DIAGNOSIS — M5136 Other intervertebral disc degeneration, lumbar region: Secondary | ICD-10-CM | POA: Diagnosis not present

## 2018-02-28 DIAGNOSIS — S24139D Anterior cord syndrome at unspecified level of thoracic spinal cord, subsequent encounter: Secondary | ICD-10-CM | POA: Diagnosis not present

## 2018-02-28 DIAGNOSIS — Z7401 Bed confinement status: Secondary | ICD-10-CM | POA: Diagnosis not present

## 2018-04-05 DIAGNOSIS — L89813 Pressure ulcer of head, stage 3: Secondary | ICD-10-CM | POA: Diagnosis not present

## 2018-04-05 DIAGNOSIS — K210 Gastro-esophageal reflux disease with esophagitis: Secondary | ICD-10-CM | POA: Diagnosis not present

## 2018-04-05 DIAGNOSIS — G8222 Paraplegia, incomplete: Secondary | ICD-10-CM | POA: Diagnosis not present

## 2018-04-05 DIAGNOSIS — R69 Illness, unspecified: Secondary | ICD-10-CM | POA: Diagnosis not present

## 2018-04-14 DIAGNOSIS — Z4800 Encounter for change or removal of nonsurgical wound dressing: Secondary | ICD-10-CM | POA: Diagnosis not present

## 2018-04-14 DIAGNOSIS — R69 Illness, unspecified: Secondary | ICD-10-CM | POA: Diagnosis not present

## 2018-04-14 DIAGNOSIS — Z466 Encounter for fitting and adjustment of urinary device: Secondary | ICD-10-CM | POA: Diagnosis not present

## 2018-04-14 DIAGNOSIS — Z87891 Personal history of nicotine dependence: Secondary | ICD-10-CM | POA: Diagnosis not present

## 2018-04-14 DIAGNOSIS — G8222 Paraplegia, incomplete: Secondary | ICD-10-CM | POA: Diagnosis not present

## 2018-04-14 DIAGNOSIS — K210 Gastro-esophageal reflux disease with esophagitis: Secondary | ICD-10-CM | POA: Diagnosis not present

## 2018-04-14 DIAGNOSIS — N390 Urinary tract infection, site not specified: Secondary | ICD-10-CM | POA: Diagnosis not present

## 2018-04-14 DIAGNOSIS — L89813 Pressure ulcer of head, stage 3: Secondary | ICD-10-CM | POA: Diagnosis not present

## 2018-04-15 DIAGNOSIS — K219 Gastro-esophageal reflux disease without esophagitis: Secondary | ICD-10-CM | POA: Diagnosis not present

## 2018-04-15 DIAGNOSIS — E785 Hyperlipidemia, unspecified: Secondary | ICD-10-CM | POA: Diagnosis not present

## 2018-04-15 DIAGNOSIS — G8929 Other chronic pain: Secondary | ICD-10-CM | POA: Diagnosis not present

## 2018-04-15 DIAGNOSIS — G8220 Paraplegia, unspecified: Secondary | ICD-10-CM | POA: Diagnosis not present

## 2018-04-15 DIAGNOSIS — M792 Neuralgia and neuritis, unspecified: Secondary | ICD-10-CM | POA: Diagnosis not present

## 2018-04-15 DIAGNOSIS — N529 Male erectile dysfunction, unspecified: Secondary | ICD-10-CM | POA: Diagnosis not present

## 2018-04-15 DIAGNOSIS — L8992 Pressure ulcer of unspecified site, stage 2: Secondary | ICD-10-CM | POA: Diagnosis not present

## 2018-04-15 DIAGNOSIS — R69 Illness, unspecified: Secondary | ICD-10-CM | POA: Diagnosis not present

## 2018-04-18 DIAGNOSIS — K210 Gastro-esophageal reflux disease with esophagitis: Secondary | ICD-10-CM | POA: Diagnosis not present

## 2018-04-18 DIAGNOSIS — Z4800 Encounter for change or removal of nonsurgical wound dressing: Secondary | ICD-10-CM | POA: Diagnosis not present

## 2018-04-18 DIAGNOSIS — Z466 Encounter for fitting and adjustment of urinary device: Secondary | ICD-10-CM | POA: Diagnosis not present

## 2018-04-18 DIAGNOSIS — S12101A Unspecified nondisplaced fracture of second cervical vertebra, initial encounter for closed fracture: Secondary | ICD-10-CM | POA: Diagnosis not present

## 2018-04-18 DIAGNOSIS — L89154 Pressure ulcer of sacral region, stage 4: Secondary | ICD-10-CM | POA: Diagnosis not present

## 2018-04-18 DIAGNOSIS — L89813 Pressure ulcer of head, stage 3: Secondary | ICD-10-CM | POA: Diagnosis not present

## 2018-04-18 DIAGNOSIS — R69 Illness, unspecified: Secondary | ICD-10-CM | POA: Diagnosis not present

## 2018-04-18 DIAGNOSIS — Z87891 Personal history of nicotine dependence: Secondary | ICD-10-CM | POA: Diagnosis not present

## 2018-04-18 DIAGNOSIS — G8222 Paraplegia, incomplete: Secondary | ICD-10-CM | POA: Diagnosis not present

## 2018-04-25 DIAGNOSIS — Z7401 Bed confinement status: Secondary | ICD-10-CM | POA: Diagnosis not present

## 2018-04-25 DIAGNOSIS — Z79899 Other long term (current) drug therapy: Secondary | ICD-10-CM | POA: Diagnosis not present

## 2018-04-25 DIAGNOSIS — M5136 Other intervertebral disc degeneration, lumbar region: Secondary | ICD-10-CM | POA: Diagnosis not present

## 2018-04-25 DIAGNOSIS — Z6827 Body mass index (BMI) 27.0-27.9, adult: Secondary | ICD-10-CM | POA: Diagnosis not present

## 2018-04-25 DIAGNOSIS — R52 Pain, unspecified: Secondary | ICD-10-CM | POA: Diagnosis not present

## 2018-04-25 DIAGNOSIS — S24139D Anterior cord syndrome at unspecified level of thoracic spinal cord, subsequent encounter: Secondary | ICD-10-CM | POA: Diagnosis not present

## 2018-04-26 DIAGNOSIS — R69 Illness, unspecified: Secondary | ICD-10-CM | POA: Diagnosis not present

## 2018-04-26 DIAGNOSIS — L89813 Pressure ulcer of head, stage 3: Secondary | ICD-10-CM | POA: Diagnosis not present

## 2018-04-26 DIAGNOSIS — Z466 Encounter for fitting and adjustment of urinary device: Secondary | ICD-10-CM | POA: Diagnosis not present

## 2018-04-26 DIAGNOSIS — G8222 Paraplegia, incomplete: Secondary | ICD-10-CM | POA: Diagnosis not present

## 2018-04-26 DIAGNOSIS — K210 Gastro-esophageal reflux disease with esophagitis: Secondary | ICD-10-CM | POA: Diagnosis not present

## 2018-04-26 DIAGNOSIS — Z4800 Encounter for change or removal of nonsurgical wound dressing: Secondary | ICD-10-CM | POA: Diagnosis not present

## 2018-04-26 DIAGNOSIS — Z87891 Personal history of nicotine dependence: Secondary | ICD-10-CM | POA: Diagnosis not present

## 2018-04-29 DIAGNOSIS — S12101A Unspecified nondisplaced fracture of second cervical vertebra, initial encounter for closed fracture: Secondary | ICD-10-CM | POA: Diagnosis not present

## 2018-04-29 DIAGNOSIS — Z466 Encounter for fitting and adjustment of urinary device: Secondary | ICD-10-CM | POA: Diagnosis not present

## 2018-04-29 DIAGNOSIS — L89154 Pressure ulcer of sacral region, stage 4: Secondary | ICD-10-CM | POA: Diagnosis not present

## 2018-05-06 DIAGNOSIS — Z4800 Encounter for change or removal of nonsurgical wound dressing: Secondary | ICD-10-CM | POA: Diagnosis not present

## 2018-05-06 DIAGNOSIS — G8222 Paraplegia, incomplete: Secondary | ICD-10-CM | POA: Diagnosis not present

## 2018-05-06 DIAGNOSIS — Z466 Encounter for fitting and adjustment of urinary device: Secondary | ICD-10-CM | POA: Diagnosis not present

## 2018-05-06 DIAGNOSIS — R69 Illness, unspecified: Secondary | ICD-10-CM | POA: Diagnosis not present

## 2018-05-06 DIAGNOSIS — N390 Urinary tract infection, site not specified: Secondary | ICD-10-CM | POA: Diagnosis not present

## 2018-05-06 DIAGNOSIS — K210 Gastro-esophageal reflux disease with esophagitis: Secondary | ICD-10-CM | POA: Diagnosis not present

## 2018-05-06 DIAGNOSIS — L89813 Pressure ulcer of head, stage 3: Secondary | ICD-10-CM | POA: Diagnosis not present

## 2018-05-06 DIAGNOSIS — Z87891 Personal history of nicotine dependence: Secondary | ICD-10-CM | POA: Diagnosis not present

## 2018-05-11 DIAGNOSIS — L89154 Pressure ulcer of sacral region, stage 4: Secondary | ICD-10-CM | POA: Diagnosis not present

## 2018-05-11 DIAGNOSIS — Z466 Encounter for fitting and adjustment of urinary device: Secondary | ICD-10-CM | POA: Diagnosis not present

## 2018-05-11 DIAGNOSIS — S12101A Unspecified nondisplaced fracture of second cervical vertebra, initial encounter for closed fracture: Secondary | ICD-10-CM | POA: Diagnosis not present

## 2018-05-12 DIAGNOSIS — K210 Gastro-esophageal reflux disease with esophagitis: Secondary | ICD-10-CM | POA: Diagnosis not present

## 2018-05-12 DIAGNOSIS — Z466 Encounter for fitting and adjustment of urinary device: Secondary | ICD-10-CM | POA: Diagnosis not present

## 2018-05-12 DIAGNOSIS — L89813 Pressure ulcer of head, stage 3: Secondary | ICD-10-CM | POA: Diagnosis not present

## 2018-05-12 DIAGNOSIS — Z87891 Personal history of nicotine dependence: Secondary | ICD-10-CM | POA: Diagnosis not present

## 2018-05-12 DIAGNOSIS — Z4800 Encounter for change or removal of nonsurgical wound dressing: Secondary | ICD-10-CM | POA: Diagnosis not present

## 2018-05-12 DIAGNOSIS — R69 Illness, unspecified: Secondary | ICD-10-CM | POA: Diagnosis not present

## 2018-05-12 DIAGNOSIS — G8222 Paraplegia, incomplete: Secondary | ICD-10-CM | POA: Diagnosis not present

## 2018-05-18 DIAGNOSIS — Z87891 Personal history of nicotine dependence: Secondary | ICD-10-CM | POA: Diagnosis not present

## 2018-05-18 DIAGNOSIS — L89813 Pressure ulcer of head, stage 3: Secondary | ICD-10-CM | POA: Diagnosis not present

## 2018-05-18 DIAGNOSIS — R69 Illness, unspecified: Secondary | ICD-10-CM | POA: Diagnosis not present

## 2018-05-18 DIAGNOSIS — Z466 Encounter for fitting and adjustment of urinary device: Secondary | ICD-10-CM | POA: Diagnosis not present

## 2018-05-18 DIAGNOSIS — K210 Gastro-esophageal reflux disease with esophagitis: Secondary | ICD-10-CM | POA: Diagnosis not present

## 2018-05-18 DIAGNOSIS — G8222 Paraplegia, incomplete: Secondary | ICD-10-CM | POA: Diagnosis not present

## 2018-05-18 DIAGNOSIS — Z4800 Encounter for change or removal of nonsurgical wound dressing: Secondary | ICD-10-CM | POA: Diagnosis not present

## 2018-05-24 DIAGNOSIS — S12101A Unspecified nondisplaced fracture of second cervical vertebra, initial encounter for closed fracture: Secondary | ICD-10-CM | POA: Diagnosis not present

## 2018-05-24 DIAGNOSIS — Z466 Encounter for fitting and adjustment of urinary device: Secondary | ICD-10-CM | POA: Diagnosis not present

## 2018-05-24 DIAGNOSIS — L89154 Pressure ulcer of sacral region, stage 4: Secondary | ICD-10-CM | POA: Diagnosis not present

## 2018-05-25 DIAGNOSIS — Z7401 Bed confinement status: Secondary | ICD-10-CM | POA: Diagnosis not present

## 2018-05-25 DIAGNOSIS — R0902 Hypoxemia: Secondary | ICD-10-CM | POA: Diagnosis not present

## 2018-05-25 DIAGNOSIS — Z79899 Other long term (current) drug therapy: Secondary | ICD-10-CM | POA: Diagnosis not present

## 2018-05-25 DIAGNOSIS — Z6827 Body mass index (BMI) 27.0-27.9, adult: Secondary | ICD-10-CM | POA: Diagnosis not present

## 2018-05-25 DIAGNOSIS — S24139D Anterior cord syndrome at unspecified level of thoracic spinal cord, subsequent encounter: Secondary | ICD-10-CM | POA: Diagnosis not present

## 2018-05-25 DIAGNOSIS — R52 Pain, unspecified: Secondary | ICD-10-CM | POA: Diagnosis not present

## 2018-05-25 DIAGNOSIS — M5136 Other intervertebral disc degeneration, lumbar region: Secondary | ICD-10-CM | POA: Diagnosis not present

## 2018-05-26 DIAGNOSIS — R69 Illness, unspecified: Secondary | ICD-10-CM | POA: Diagnosis not present

## 2018-05-26 DIAGNOSIS — Z466 Encounter for fitting and adjustment of urinary device: Secondary | ICD-10-CM | POA: Diagnosis not present

## 2018-05-26 DIAGNOSIS — K210 Gastro-esophageal reflux disease with esophagitis: Secondary | ICD-10-CM | POA: Diagnosis not present

## 2018-05-26 DIAGNOSIS — L89813 Pressure ulcer of head, stage 3: Secondary | ICD-10-CM | POA: Diagnosis not present

## 2018-05-26 DIAGNOSIS — G8222 Paraplegia, incomplete: Secondary | ICD-10-CM | POA: Diagnosis not present

## 2018-05-26 DIAGNOSIS — N390 Urinary tract infection, site not specified: Secondary | ICD-10-CM | POA: Diagnosis not present

## 2018-05-26 DIAGNOSIS — Z4800 Encounter for change or removal of nonsurgical wound dressing: Secondary | ICD-10-CM | POA: Diagnosis not present

## 2018-05-26 DIAGNOSIS — Z87891 Personal history of nicotine dependence: Secondary | ICD-10-CM | POA: Diagnosis not present

## 2018-06-02 DIAGNOSIS — G8222 Paraplegia, incomplete: Secondary | ICD-10-CM | POA: Diagnosis not present

## 2018-06-02 DIAGNOSIS — K210 Gastro-esophageal reflux disease with esophagitis: Secondary | ICD-10-CM | POA: Diagnosis not present

## 2018-06-02 DIAGNOSIS — L89813 Pressure ulcer of head, stage 3: Secondary | ICD-10-CM | POA: Diagnosis not present

## 2018-06-02 DIAGNOSIS — Z87891 Personal history of nicotine dependence: Secondary | ICD-10-CM | POA: Diagnosis not present

## 2018-06-02 DIAGNOSIS — Z466 Encounter for fitting and adjustment of urinary device: Secondary | ICD-10-CM | POA: Diagnosis not present

## 2018-06-02 DIAGNOSIS — R69 Illness, unspecified: Secondary | ICD-10-CM | POA: Diagnosis not present

## 2018-06-02 DIAGNOSIS — Z4800 Encounter for change or removal of nonsurgical wound dressing: Secondary | ICD-10-CM | POA: Diagnosis not present

## 2018-06-09 DIAGNOSIS — Z466 Encounter for fitting and adjustment of urinary device: Secondary | ICD-10-CM | POA: Diagnosis not present

## 2018-06-09 DIAGNOSIS — Z87891 Personal history of nicotine dependence: Secondary | ICD-10-CM | POA: Diagnosis not present

## 2018-06-09 DIAGNOSIS — Z7409 Other reduced mobility: Secondary | ICD-10-CM | POA: Diagnosis not present

## 2018-06-09 DIAGNOSIS — G8222 Paraplegia, incomplete: Secondary | ICD-10-CM | POA: Diagnosis not present

## 2018-06-09 DIAGNOSIS — L89813 Pressure ulcer of head, stage 3: Secondary | ICD-10-CM | POA: Diagnosis not present

## 2018-06-09 DIAGNOSIS — K210 Gastro-esophageal reflux disease with esophagitis: Secondary | ICD-10-CM | POA: Diagnosis not present

## 2018-06-09 DIAGNOSIS — R69 Illness, unspecified: Secondary | ICD-10-CM | POA: Diagnosis not present

## 2018-06-09 DIAGNOSIS — Z4800 Encounter for change or removal of nonsurgical wound dressing: Secondary | ICD-10-CM | POA: Diagnosis not present

## 2018-06-09 DIAGNOSIS — L89154 Pressure ulcer of sacral region, stage 4: Secondary | ICD-10-CM | POA: Diagnosis not present

## 2018-06-09 DIAGNOSIS — S12101A Unspecified nondisplaced fracture of second cervical vertebra, initial encounter for closed fracture: Secondary | ICD-10-CM | POA: Diagnosis not present

## 2018-06-13 DIAGNOSIS — N390 Urinary tract infection, site not specified: Secondary | ICD-10-CM | POA: Diagnosis not present

## 2018-06-13 DIAGNOSIS — R69 Illness, unspecified: Secondary | ICD-10-CM | POA: Diagnosis not present

## 2018-06-13 DIAGNOSIS — L89813 Pressure ulcer of head, stage 3: Secondary | ICD-10-CM | POA: Diagnosis not present

## 2018-06-13 DIAGNOSIS — G8222 Paraplegia, incomplete: Secondary | ICD-10-CM | POA: Diagnosis not present

## 2018-06-13 DIAGNOSIS — Z466 Encounter for fitting and adjustment of urinary device: Secondary | ICD-10-CM | POA: Diagnosis not present

## 2018-06-14 DIAGNOSIS — R69 Illness, unspecified: Secondary | ICD-10-CM | POA: Diagnosis not present

## 2018-06-14 DIAGNOSIS — Z466 Encounter for fitting and adjustment of urinary device: Secondary | ICD-10-CM | POA: Diagnosis not present

## 2018-06-14 DIAGNOSIS — G8222 Paraplegia, incomplete: Secondary | ICD-10-CM | POA: Diagnosis not present

## 2018-06-14 DIAGNOSIS — Z87891 Personal history of nicotine dependence: Secondary | ICD-10-CM | POA: Diagnosis not present

## 2018-06-14 DIAGNOSIS — L89813 Pressure ulcer of head, stage 3: Secondary | ICD-10-CM | POA: Diagnosis not present

## 2018-06-14 DIAGNOSIS — Z4800 Encounter for change or removal of nonsurgical wound dressing: Secondary | ICD-10-CM | POA: Diagnosis not present

## 2018-06-14 DIAGNOSIS — K210 Gastro-esophageal reflux disease with esophagitis: Secondary | ICD-10-CM | POA: Diagnosis not present

## 2018-06-22 DIAGNOSIS — R5381 Other malaise: Secondary | ICD-10-CM | POA: Diagnosis not present

## 2018-06-22 DIAGNOSIS — R69 Illness, unspecified: Secondary | ICD-10-CM | POA: Diagnosis not present

## 2018-06-22 DIAGNOSIS — K210 Gastro-esophageal reflux disease with esophagitis: Secondary | ICD-10-CM | POA: Diagnosis not present

## 2018-06-22 DIAGNOSIS — G8222 Paraplegia, incomplete: Secondary | ICD-10-CM | POA: Diagnosis not present

## 2018-06-22 DIAGNOSIS — M545 Low back pain: Secondary | ICD-10-CM | POA: Diagnosis not present

## 2018-06-22 DIAGNOSIS — L89813 Pressure ulcer of head, stage 3: Secondary | ICD-10-CM | POA: Diagnosis not present

## 2018-06-23 DIAGNOSIS — N390 Urinary tract infection, site not specified: Secondary | ICD-10-CM | POA: Diagnosis not present

## 2018-06-23 DIAGNOSIS — R69 Illness, unspecified: Secondary | ICD-10-CM | POA: Diagnosis not present

## 2018-06-23 DIAGNOSIS — Z87891 Personal history of nicotine dependence: Secondary | ICD-10-CM | POA: Diagnosis not present

## 2018-06-23 DIAGNOSIS — Z4800 Encounter for change or removal of nonsurgical wound dressing: Secondary | ICD-10-CM | POA: Diagnosis not present

## 2018-06-23 DIAGNOSIS — L89813 Pressure ulcer of head, stage 3: Secondary | ICD-10-CM | POA: Diagnosis not present

## 2018-06-23 DIAGNOSIS — G8222 Paraplegia, incomplete: Secondary | ICD-10-CM | POA: Diagnosis not present

## 2018-06-23 DIAGNOSIS — Z466 Encounter for fitting and adjustment of urinary device: Secondary | ICD-10-CM | POA: Diagnosis not present

## 2018-06-23 DIAGNOSIS — K210 Gastro-esophageal reflux disease with esophagitis: Secondary | ICD-10-CM | POA: Diagnosis not present

## 2018-06-24 DIAGNOSIS — Z79899 Other long term (current) drug therapy: Secondary | ICD-10-CM | POA: Diagnosis not present

## 2018-06-24 DIAGNOSIS — Z6827 Body mass index (BMI) 27.0-27.9, adult: Secondary | ICD-10-CM | POA: Diagnosis not present

## 2018-06-24 DIAGNOSIS — M5136 Other intervertebral disc degeneration, lumbar region: Secondary | ICD-10-CM | POA: Diagnosis not present

## 2018-06-24 DIAGNOSIS — S24139D Anterior cord syndrome at unspecified level of thoracic spinal cord, subsequent encounter: Secondary | ICD-10-CM | POA: Diagnosis not present

## 2018-06-24 DIAGNOSIS — M5489 Other dorsalgia: Secondary | ICD-10-CM | POA: Diagnosis not present

## 2018-06-28 DIAGNOSIS — G8222 Paraplegia, incomplete: Secondary | ICD-10-CM | POA: Diagnosis not present

## 2018-06-28 DIAGNOSIS — R69 Illness, unspecified: Secondary | ICD-10-CM | POA: Diagnosis not present

## 2018-06-28 DIAGNOSIS — K210 Gastro-esophageal reflux disease with esophagitis: Secondary | ICD-10-CM | POA: Diagnosis not present

## 2018-06-28 DIAGNOSIS — Z87891 Personal history of nicotine dependence: Secondary | ICD-10-CM | POA: Diagnosis not present

## 2018-06-28 DIAGNOSIS — L89813 Pressure ulcer of head, stage 3: Secondary | ICD-10-CM | POA: Diagnosis not present

## 2018-06-28 DIAGNOSIS — Z4800 Encounter for change or removal of nonsurgical wound dressing: Secondary | ICD-10-CM | POA: Diagnosis not present

## 2018-06-28 DIAGNOSIS — Z466 Encounter for fitting and adjustment of urinary device: Secondary | ICD-10-CM | POA: Diagnosis not present

## 2018-06-28 DIAGNOSIS — N390 Urinary tract infection, site not specified: Secondary | ICD-10-CM | POA: Diagnosis not present

## 2018-06-29 DIAGNOSIS — L89154 Pressure ulcer of sacral region, stage 4: Secondary | ICD-10-CM | POA: Diagnosis not present

## 2018-06-29 DIAGNOSIS — S12101A Unspecified nondisplaced fracture of second cervical vertebra, initial encounter for closed fracture: Secondary | ICD-10-CM | POA: Diagnosis not present

## 2018-06-29 DIAGNOSIS — Z466 Encounter for fitting and adjustment of urinary device: Secondary | ICD-10-CM | POA: Diagnosis not present

## 2018-07-02 DIAGNOSIS — Z466 Encounter for fitting and adjustment of urinary device: Secondary | ICD-10-CM | POA: Diagnosis not present

## 2018-07-02 DIAGNOSIS — L89154 Pressure ulcer of sacral region, stage 4: Secondary | ICD-10-CM | POA: Diagnosis not present

## 2018-07-02 DIAGNOSIS — S12101A Unspecified nondisplaced fracture of second cervical vertebra, initial encounter for closed fracture: Secondary | ICD-10-CM | POA: Diagnosis not present

## 2018-07-09 DIAGNOSIS — G8222 Paraplegia, incomplete: Secondary | ICD-10-CM | POA: Diagnosis not present

## 2018-07-09 DIAGNOSIS — Z7409 Other reduced mobility: Secondary | ICD-10-CM | POA: Diagnosis not present

## 2018-07-09 DIAGNOSIS — L89154 Pressure ulcer of sacral region, stage 4: Secondary | ICD-10-CM | POA: Diagnosis not present

## 2018-07-09 DIAGNOSIS — S12101A Unspecified nondisplaced fracture of second cervical vertebra, initial encounter for closed fracture: Secondary | ICD-10-CM | POA: Diagnosis not present

## 2018-07-09 DIAGNOSIS — Z466 Encounter for fitting and adjustment of urinary device: Secondary | ICD-10-CM | POA: Diagnosis not present

## 2018-07-14 DIAGNOSIS — Z87891 Personal history of nicotine dependence: Secondary | ICD-10-CM | POA: Diagnosis not present

## 2018-07-14 DIAGNOSIS — K210 Gastro-esophageal reflux disease with esophagitis: Secondary | ICD-10-CM | POA: Diagnosis not present

## 2018-07-14 DIAGNOSIS — Z4800 Encounter for change or removal of nonsurgical wound dressing: Secondary | ICD-10-CM | POA: Diagnosis not present

## 2018-07-14 DIAGNOSIS — R69 Illness, unspecified: Secondary | ICD-10-CM | POA: Diagnosis not present

## 2018-07-14 DIAGNOSIS — N390 Urinary tract infection, site not specified: Secondary | ICD-10-CM | POA: Diagnosis not present

## 2018-07-14 DIAGNOSIS — L89813 Pressure ulcer of head, stage 3: Secondary | ICD-10-CM | POA: Diagnosis not present

## 2018-07-14 DIAGNOSIS — G8222 Paraplegia, incomplete: Secondary | ICD-10-CM | POA: Diagnosis not present

## 2018-07-14 DIAGNOSIS — Z466 Encounter for fitting and adjustment of urinary device: Secondary | ICD-10-CM | POA: Diagnosis not present

## 2018-07-19 DIAGNOSIS — S12101A Unspecified nondisplaced fracture of second cervical vertebra, initial encounter for closed fracture: Secondary | ICD-10-CM | POA: Diagnosis not present

## 2018-07-19 DIAGNOSIS — L89154 Pressure ulcer of sacral region, stage 4: Secondary | ICD-10-CM | POA: Diagnosis not present

## 2018-07-19 DIAGNOSIS — Z466 Encounter for fitting and adjustment of urinary device: Secondary | ICD-10-CM | POA: Diagnosis not present

## 2018-07-25 DIAGNOSIS — S24139D Anterior cord syndrome at unspecified level of thoracic spinal cord, subsequent encounter: Secondary | ICD-10-CM | POA: Diagnosis not present

## 2018-07-25 DIAGNOSIS — M5136 Other intervertebral disc degeneration, lumbar region: Secondary | ICD-10-CM | POA: Diagnosis not present

## 2018-07-25 DIAGNOSIS — Z7401 Bed confinement status: Secondary | ICD-10-CM | POA: Diagnosis not present

## 2018-07-25 DIAGNOSIS — R5381 Other malaise: Secondary | ICD-10-CM | POA: Diagnosis not present

## 2018-07-25 DIAGNOSIS — Z6827 Body mass index (BMI) 27.0-27.9, adult: Secondary | ICD-10-CM | POA: Diagnosis not present

## 2018-07-25 DIAGNOSIS — Z79899 Other long term (current) drug therapy: Secondary | ICD-10-CM | POA: Diagnosis not present

## 2018-07-25 DIAGNOSIS — R279 Unspecified lack of coordination: Secondary | ICD-10-CM | POA: Diagnosis not present

## 2018-07-27 DIAGNOSIS — L89813 Pressure ulcer of head, stage 3: Secondary | ICD-10-CM | POA: Diagnosis not present

## 2018-07-27 DIAGNOSIS — G8222 Paraplegia, incomplete: Secondary | ICD-10-CM | POA: Diagnosis not present

## 2018-07-27 DIAGNOSIS — Z466 Encounter for fitting and adjustment of urinary device: Secondary | ICD-10-CM | POA: Diagnosis not present

## 2018-07-27 DIAGNOSIS — N390 Urinary tract infection, site not specified: Secondary | ICD-10-CM | POA: Diagnosis not present

## 2018-07-27 DIAGNOSIS — R69 Illness, unspecified: Secondary | ICD-10-CM | POA: Diagnosis not present

## 2018-07-27 DIAGNOSIS — K210 Gastro-esophageal reflux disease with esophagitis: Secondary | ICD-10-CM | POA: Diagnosis not present

## 2018-07-27 DIAGNOSIS — Z4800 Encounter for change or removal of nonsurgical wound dressing: Secondary | ICD-10-CM | POA: Diagnosis not present

## 2018-07-27 DIAGNOSIS — Z87891 Personal history of nicotine dependence: Secondary | ICD-10-CM | POA: Diagnosis not present

## 2018-07-30 DIAGNOSIS — Z87891 Personal history of nicotine dependence: Secondary | ICD-10-CM | POA: Diagnosis not present

## 2018-07-30 DIAGNOSIS — R69 Illness, unspecified: Secondary | ICD-10-CM | POA: Diagnosis not present

## 2018-07-30 DIAGNOSIS — G8222 Paraplegia, incomplete: Secondary | ICD-10-CM | POA: Diagnosis not present

## 2018-07-30 DIAGNOSIS — L89813 Pressure ulcer of head, stage 3: Secondary | ICD-10-CM | POA: Diagnosis not present

## 2018-07-30 DIAGNOSIS — Z466 Encounter for fitting and adjustment of urinary device: Secondary | ICD-10-CM | POA: Diagnosis not present

## 2018-07-30 DIAGNOSIS — Z4800 Encounter for change or removal of nonsurgical wound dressing: Secondary | ICD-10-CM | POA: Diagnosis not present

## 2018-07-30 DIAGNOSIS — K210 Gastro-esophageal reflux disease with esophagitis: Secondary | ICD-10-CM | POA: Diagnosis not present

## 2018-07-30 DIAGNOSIS — N390 Urinary tract infection, site not specified: Secondary | ICD-10-CM | POA: Diagnosis not present

## 2018-08-01 DIAGNOSIS — L89154 Pressure ulcer of sacral region, stage 4: Secondary | ICD-10-CM | POA: Diagnosis not present

## 2018-08-01 DIAGNOSIS — Z466 Encounter for fitting and adjustment of urinary device: Secondary | ICD-10-CM | POA: Diagnosis not present

## 2018-08-01 DIAGNOSIS — S12101A Unspecified nondisplaced fracture of second cervical vertebra, initial encounter for closed fracture: Secondary | ICD-10-CM | POA: Diagnosis not present

## 2018-08-03 DIAGNOSIS — L89154 Pressure ulcer of sacral region, stage 4: Secondary | ICD-10-CM | POA: Diagnosis not present

## 2018-08-03 DIAGNOSIS — Z466 Encounter for fitting and adjustment of urinary device: Secondary | ICD-10-CM | POA: Diagnosis not present

## 2018-08-03 DIAGNOSIS — S12101A Unspecified nondisplaced fracture of second cervical vertebra, initial encounter for closed fracture: Secondary | ICD-10-CM | POA: Diagnosis not present

## 2018-08-09 DIAGNOSIS — S12101A Unspecified nondisplaced fracture of second cervical vertebra, initial encounter for closed fracture: Secondary | ICD-10-CM | POA: Diagnosis not present

## 2018-08-09 DIAGNOSIS — Z466 Encounter for fitting and adjustment of urinary device: Secondary | ICD-10-CM | POA: Diagnosis not present

## 2018-08-09 DIAGNOSIS — G8222 Paraplegia, incomplete: Secondary | ICD-10-CM | POA: Diagnosis not present

## 2018-08-09 DIAGNOSIS — Z7409 Other reduced mobility: Secondary | ICD-10-CM | POA: Diagnosis not present

## 2018-08-09 DIAGNOSIS — L89154 Pressure ulcer of sacral region, stage 4: Secondary | ICD-10-CM | POA: Diagnosis not present

## 2018-08-11 DIAGNOSIS — Z7689 Persons encountering health services in other specified circumstances: Secondary | ICD-10-CM | POA: Diagnosis not present

## 2018-08-11 DIAGNOSIS — Z466 Encounter for fitting and adjustment of urinary device: Secondary | ICD-10-CM | POA: Diagnosis not present

## 2018-08-11 DIAGNOSIS — R69 Illness, unspecified: Secondary | ICD-10-CM | POA: Diagnosis not present

## 2018-08-11 DIAGNOSIS — L89813 Pressure ulcer of head, stage 3: Secondary | ICD-10-CM | POA: Diagnosis not present

## 2018-08-11 DIAGNOSIS — Z87891 Personal history of nicotine dependence: Secondary | ICD-10-CM | POA: Diagnosis not present

## 2018-08-11 DIAGNOSIS — G8222 Paraplegia, incomplete: Secondary | ICD-10-CM | POA: Diagnosis not present

## 2018-08-11 DIAGNOSIS — K210 Gastro-esophageal reflux disease with esophagitis: Secondary | ICD-10-CM | POA: Diagnosis not present

## 2018-08-11 DIAGNOSIS — Z4800 Encounter for change or removal of nonsurgical wound dressing: Secondary | ICD-10-CM | POA: Diagnosis not present

## 2018-08-11 DIAGNOSIS — N390 Urinary tract infection, site not specified: Secondary | ICD-10-CM | POA: Diagnosis not present

## 2018-08-12 DIAGNOSIS — G8222 Paraplegia, incomplete: Secondary | ICD-10-CM | POA: Diagnosis not present

## 2018-08-12 DIAGNOSIS — L89813 Pressure ulcer of head, stage 3: Secondary | ICD-10-CM | POA: Diagnosis not present

## 2018-08-12 DIAGNOSIS — N390 Urinary tract infection, site not specified: Secondary | ICD-10-CM | POA: Diagnosis not present

## 2018-08-12 DIAGNOSIS — Z466 Encounter for fitting and adjustment of urinary device: Secondary | ICD-10-CM | POA: Diagnosis not present

## 2018-08-12 DIAGNOSIS — R69 Illness, unspecified: Secondary | ICD-10-CM | POA: Diagnosis not present

## 2018-08-17 DIAGNOSIS — Z466 Encounter for fitting and adjustment of urinary device: Secondary | ICD-10-CM | POA: Diagnosis not present

## 2018-08-17 DIAGNOSIS — L89154 Pressure ulcer of sacral region, stage 4: Secondary | ICD-10-CM | POA: Diagnosis not present

## 2018-08-17 DIAGNOSIS — S12101A Unspecified nondisplaced fracture of second cervical vertebra, initial encounter for closed fracture: Secondary | ICD-10-CM | POA: Diagnosis not present

## 2018-08-23 DIAGNOSIS — L89813 Pressure ulcer of head, stage 3: Secondary | ICD-10-CM | POA: Diagnosis not present

## 2018-08-23 DIAGNOSIS — Z87891 Personal history of nicotine dependence: Secondary | ICD-10-CM | POA: Diagnosis not present

## 2018-08-23 DIAGNOSIS — M5136 Other intervertebral disc degeneration, lumbar region: Secondary | ICD-10-CM | POA: Diagnosis not present

## 2018-08-23 DIAGNOSIS — G894 Chronic pain syndrome: Secondary | ICD-10-CM | POA: Diagnosis not present

## 2018-08-23 DIAGNOSIS — M5134 Other intervertebral disc degeneration, thoracic region: Secondary | ICD-10-CM | POA: Diagnosis not present

## 2018-08-23 DIAGNOSIS — K210 Gastro-esophageal reflux disease with esophagitis: Secondary | ICD-10-CM | POA: Diagnosis not present

## 2018-08-23 DIAGNOSIS — Z79899 Other long term (current) drug therapy: Secondary | ICD-10-CM | POA: Diagnosis not present

## 2018-08-23 DIAGNOSIS — R69 Illness, unspecified: Secondary | ICD-10-CM | POA: Diagnosis not present

## 2018-08-23 DIAGNOSIS — Z4800 Encounter for change or removal of nonsurgical wound dressing: Secondary | ICD-10-CM | POA: Diagnosis not present

## 2018-08-23 DIAGNOSIS — G8222 Paraplegia, incomplete: Secondary | ICD-10-CM | POA: Diagnosis not present

## 2018-08-23 DIAGNOSIS — Z466 Encounter for fitting and adjustment of urinary device: Secondary | ICD-10-CM | POA: Diagnosis not present

## 2018-08-23 DIAGNOSIS — N390 Urinary tract infection, site not specified: Secondary | ICD-10-CM | POA: Diagnosis not present

## 2018-08-23 DIAGNOSIS — S24139D Anterior cord syndrome at unspecified level of thoracic spinal cord, subsequent encounter: Secondary | ICD-10-CM | POA: Diagnosis not present

## 2018-08-23 DIAGNOSIS — Z7401 Bed confinement status: Secondary | ICD-10-CM | POA: Diagnosis not present

## 2018-08-23 DIAGNOSIS — R5381 Other malaise: Secondary | ICD-10-CM | POA: Diagnosis not present

## 2018-08-24 DIAGNOSIS — Z466 Encounter for fitting and adjustment of urinary device: Secondary | ICD-10-CM | POA: Diagnosis not present

## 2018-08-24 DIAGNOSIS — N390 Urinary tract infection, site not specified: Secondary | ICD-10-CM | POA: Diagnosis not present

## 2018-08-24 DIAGNOSIS — L89813 Pressure ulcer of head, stage 3: Secondary | ICD-10-CM | POA: Diagnosis not present

## 2018-08-24 DIAGNOSIS — K210 Gastro-esophageal reflux disease with esophagitis: Secondary | ICD-10-CM | POA: Diagnosis not present

## 2018-08-24 DIAGNOSIS — Z87891 Personal history of nicotine dependence: Secondary | ICD-10-CM | POA: Diagnosis not present

## 2018-08-24 DIAGNOSIS — R69 Illness, unspecified: Secondary | ICD-10-CM | POA: Diagnosis not present

## 2018-08-24 DIAGNOSIS — G8222 Paraplegia, incomplete: Secondary | ICD-10-CM | POA: Diagnosis not present

## 2018-08-24 DIAGNOSIS — Z4800 Encounter for change or removal of nonsurgical wound dressing: Secondary | ICD-10-CM | POA: Diagnosis not present

## 2018-08-31 DIAGNOSIS — S12101A Unspecified nondisplaced fracture of second cervical vertebra, initial encounter for closed fracture: Secondary | ICD-10-CM | POA: Diagnosis not present

## 2018-08-31 DIAGNOSIS — Z466 Encounter for fitting and adjustment of urinary device: Secondary | ICD-10-CM | POA: Diagnosis not present

## 2018-08-31 DIAGNOSIS — L89154 Pressure ulcer of sacral region, stage 4: Secondary | ICD-10-CM | POA: Diagnosis not present

## 2018-09-06 ENCOUNTER — Encounter (HOSPITAL_COMMUNITY): Payer: Self-pay

## 2018-09-06 ENCOUNTER — Ambulatory Visit (HOSPITAL_COMMUNITY): Payer: Medicare HMO

## 2018-09-08 DIAGNOSIS — S12101A Unspecified nondisplaced fracture of second cervical vertebra, initial encounter for closed fracture: Secondary | ICD-10-CM | POA: Diagnosis not present

## 2018-09-08 DIAGNOSIS — Z7409 Other reduced mobility: Secondary | ICD-10-CM | POA: Diagnosis not present

## 2018-09-08 DIAGNOSIS — N390 Urinary tract infection, site not specified: Secondary | ICD-10-CM | POA: Diagnosis not present

## 2018-09-08 DIAGNOSIS — Z4800 Encounter for change or removal of nonsurgical wound dressing: Secondary | ICD-10-CM | POA: Diagnosis not present

## 2018-09-08 DIAGNOSIS — Z87891 Personal history of nicotine dependence: Secondary | ICD-10-CM | POA: Diagnosis not present

## 2018-09-08 DIAGNOSIS — G8222 Paraplegia, incomplete: Secondary | ICD-10-CM | POA: Diagnosis not present

## 2018-09-08 DIAGNOSIS — Z466 Encounter for fitting and adjustment of urinary device: Secondary | ICD-10-CM | POA: Diagnosis not present

## 2018-09-08 DIAGNOSIS — K210 Gastro-esophageal reflux disease with esophagitis: Secondary | ICD-10-CM | POA: Diagnosis not present

## 2018-09-08 DIAGNOSIS — R69 Illness, unspecified: Secondary | ICD-10-CM | POA: Diagnosis not present

## 2018-09-08 DIAGNOSIS — L89813 Pressure ulcer of head, stage 3: Secondary | ICD-10-CM | POA: Diagnosis not present

## 2018-09-08 DIAGNOSIS — L89154 Pressure ulcer of sacral region, stage 4: Secondary | ICD-10-CM | POA: Diagnosis not present

## 2018-09-15 DIAGNOSIS — G8222 Paraplegia, incomplete: Secondary | ICD-10-CM | POA: Diagnosis not present

## 2018-09-15 DIAGNOSIS — Z466 Encounter for fitting and adjustment of urinary device: Secondary | ICD-10-CM | POA: Diagnosis not present

## 2018-09-15 DIAGNOSIS — N390 Urinary tract infection, site not specified: Secondary | ICD-10-CM | POA: Diagnosis not present

## 2018-09-15 DIAGNOSIS — Z87891 Personal history of nicotine dependence: Secondary | ICD-10-CM | POA: Diagnosis not present

## 2018-09-15 DIAGNOSIS — R69 Illness, unspecified: Secondary | ICD-10-CM | POA: Diagnosis not present

## 2018-09-15 DIAGNOSIS — L89813 Pressure ulcer of head, stage 3: Secondary | ICD-10-CM | POA: Diagnosis not present

## 2018-09-15 DIAGNOSIS — K210 Gastro-esophageal reflux disease with esophagitis: Secondary | ICD-10-CM | POA: Diagnosis not present

## 2018-09-15 DIAGNOSIS — Z4800 Encounter for change or removal of nonsurgical wound dressing: Secondary | ICD-10-CM | POA: Diagnosis not present

## 2018-09-20 DIAGNOSIS — S12101A Unspecified nondisplaced fracture of second cervical vertebra, initial encounter for closed fracture: Secondary | ICD-10-CM | POA: Diagnosis not present

## 2018-09-20 DIAGNOSIS — Z466 Encounter for fitting and adjustment of urinary device: Secondary | ICD-10-CM | POA: Diagnosis not present

## 2018-09-20 DIAGNOSIS — L89154 Pressure ulcer of sacral region, stage 4: Secondary | ICD-10-CM | POA: Diagnosis not present

## 2018-09-22 DIAGNOSIS — G894 Chronic pain syndrome: Secondary | ICD-10-CM | POA: Diagnosis not present

## 2018-09-22 DIAGNOSIS — M5134 Other intervertebral disc degeneration, thoracic region: Secondary | ICD-10-CM | POA: Diagnosis not present

## 2018-09-22 DIAGNOSIS — M5136 Other intervertebral disc degeneration, lumbar region: Secondary | ICD-10-CM | POA: Diagnosis not present

## 2018-09-22 DIAGNOSIS — S24139D Anterior cord syndrome at unspecified level of thoracic spinal cord, subsequent encounter: Secondary | ICD-10-CM | POA: Diagnosis not present

## 2018-09-22 DIAGNOSIS — Z79899 Other long term (current) drug therapy: Secondary | ICD-10-CM | POA: Diagnosis not present

## 2018-09-23 DIAGNOSIS — N390 Urinary tract infection, site not specified: Secondary | ICD-10-CM | POA: Diagnosis not present

## 2018-09-23 DIAGNOSIS — L89813 Pressure ulcer of head, stage 3: Secondary | ICD-10-CM | POA: Diagnosis not present

## 2018-09-23 DIAGNOSIS — Z466 Encounter for fitting and adjustment of urinary device: Secondary | ICD-10-CM | POA: Diagnosis not present

## 2018-09-23 DIAGNOSIS — K210 Gastro-esophageal reflux disease with esophagitis: Secondary | ICD-10-CM | POA: Diagnosis not present

## 2018-09-23 DIAGNOSIS — Z4800 Encounter for change or removal of nonsurgical wound dressing: Secondary | ICD-10-CM | POA: Diagnosis not present

## 2018-09-23 DIAGNOSIS — M545 Low back pain: Secondary | ICD-10-CM | POA: Diagnosis not present

## 2018-09-23 DIAGNOSIS — G8222 Paraplegia, incomplete: Secondary | ICD-10-CM | POA: Diagnosis not present

## 2018-09-23 DIAGNOSIS — Z743 Need for continuous supervision: Secondary | ICD-10-CM | POA: Diagnosis not present

## 2018-09-23 DIAGNOSIS — R5381 Other malaise: Secondary | ICD-10-CM | POA: Diagnosis not present

## 2018-09-23 DIAGNOSIS — R69 Illness, unspecified: Secondary | ICD-10-CM | POA: Diagnosis not present

## 2018-09-23 DIAGNOSIS — Z87891 Personal history of nicotine dependence: Secondary | ICD-10-CM | POA: Diagnosis not present

## 2018-09-27 DIAGNOSIS — L89154 Pressure ulcer of sacral region, stage 4: Secondary | ICD-10-CM | POA: Diagnosis not present

## 2018-09-27 DIAGNOSIS — S12101A Unspecified nondisplaced fracture of second cervical vertebra, initial encounter for closed fracture: Secondary | ICD-10-CM | POA: Diagnosis not present

## 2018-09-27 DIAGNOSIS — Z466 Encounter for fitting and adjustment of urinary device: Secondary | ICD-10-CM | POA: Diagnosis not present

## 2018-09-30 DIAGNOSIS — R509 Fever, unspecified: Secondary | ICD-10-CM | POA: Diagnosis not present

## 2018-09-30 DIAGNOSIS — R112 Nausea with vomiting, unspecified: Secondary | ICD-10-CM | POA: Diagnosis not present

## 2018-09-30 DIAGNOSIS — E440 Moderate protein-calorie malnutrition: Secondary | ICD-10-CM | POA: Diagnosis not present

## 2018-09-30 DIAGNOSIS — E872 Acidosis: Secondary | ICD-10-CM | POA: Diagnosis not present

## 2018-09-30 DIAGNOSIS — Z743 Need for continuous supervision: Secondary | ICD-10-CM | POA: Diagnosis not present

## 2018-09-30 DIAGNOSIS — I2510 Atherosclerotic heart disease of native coronary artery without angina pectoris: Secondary | ICD-10-CM | POA: Diagnosis not present

## 2018-09-30 DIAGNOSIS — A419 Sepsis, unspecified organism: Secondary | ICD-10-CM | POA: Diagnosis not present

## 2018-09-30 DIAGNOSIS — R0689 Other abnormalities of breathing: Secondary | ICD-10-CM | POA: Diagnosis not present

## 2018-09-30 DIAGNOSIS — N200 Calculus of kidney: Secondary | ICD-10-CM | POA: Diagnosis not present

## 2018-09-30 DIAGNOSIS — R110 Nausea: Secondary | ICD-10-CM | POA: Diagnosis not present

## 2018-09-30 DIAGNOSIS — R1111 Vomiting without nausea: Secondary | ICD-10-CM | POA: Diagnosis not present

## 2018-09-30 DIAGNOSIS — N400 Enlarged prostate without lower urinary tract symptoms: Secondary | ICD-10-CM | POA: Diagnosis not present

## 2018-09-30 DIAGNOSIS — F419 Anxiety disorder, unspecified: Secondary | ICD-10-CM | POA: Diagnosis not present

## 2018-09-30 DIAGNOSIS — E876 Hypokalemia: Secondary | ICD-10-CM | POA: Diagnosis not present

## 2018-09-30 DIAGNOSIS — R52 Pain, unspecified: Secondary | ICD-10-CM | POA: Diagnosis not present

## 2018-09-30 DIAGNOSIS — R531 Weakness: Secondary | ICD-10-CM | POA: Diagnosis not present

## 2018-09-30 DIAGNOSIS — I10 Essential (primary) hypertension: Secondary | ICD-10-CM | POA: Diagnosis not present

## 2018-09-30 DIAGNOSIS — F329 Major depressive disorder, single episode, unspecified: Secondary | ICD-10-CM | POA: Diagnosis not present

## 2018-10-08 DIAGNOSIS — R339 Retention of urine, unspecified: Secondary | ICD-10-CM | POA: Diagnosis not present

## 2018-10-08 DIAGNOSIS — Z87440 Personal history of urinary (tract) infections: Secondary | ICD-10-CM | POA: Diagnosis not present

## 2018-10-08 DIAGNOSIS — N390 Urinary tract infection, site not specified: Secondary | ICD-10-CM | POA: Diagnosis not present

## 2018-10-08 DIAGNOSIS — Z5181 Encounter for therapeutic drug level monitoring: Secondary | ICD-10-CM | POA: Diagnosis not present

## 2018-10-08 DIAGNOSIS — Z792 Long term (current) use of antibiotics: Secondary | ICD-10-CM | POA: Diagnosis not present

## 2018-10-08 DIAGNOSIS — Z452 Encounter for adjustment and management of vascular access device: Secondary | ICD-10-CM | POA: Diagnosis not present

## 2018-10-08 DIAGNOSIS — Z466 Encounter for fitting and adjustment of urinary device: Secondary | ICD-10-CM | POA: Diagnosis not present

## 2018-10-08 DIAGNOSIS — G8222 Paraplegia, incomplete: Secondary | ICD-10-CM | POA: Diagnosis not present

## 2018-10-08 DIAGNOSIS — F411 Generalized anxiety disorder: Secondary | ICD-10-CM | POA: Diagnosis not present

## 2018-10-08 DIAGNOSIS — B9562 Methicillin resistant Staphylococcus aureus infection as the cause of diseases classified elsewhere: Secondary | ICD-10-CM | POA: Diagnosis not present

## 2018-10-09 DIAGNOSIS — G8222 Paraplegia, incomplete: Secondary | ICD-10-CM | POA: Diagnosis not present

## 2018-10-09 DIAGNOSIS — S12101A Unspecified nondisplaced fracture of second cervical vertebra, initial encounter for closed fracture: Secondary | ICD-10-CM | POA: Diagnosis not present

## 2018-10-09 DIAGNOSIS — Z7409 Other reduced mobility: Secondary | ICD-10-CM | POA: Diagnosis not present

## 2018-10-09 DIAGNOSIS — L89154 Pressure ulcer of sacral region, stage 4: Secondary | ICD-10-CM | POA: Diagnosis not present

## 2018-10-09 DIAGNOSIS — Z466 Encounter for fitting and adjustment of urinary device: Secondary | ICD-10-CM | POA: Diagnosis not present

## 2018-10-10 DIAGNOSIS — S12101A Unspecified nondisplaced fracture of second cervical vertebra, initial encounter for closed fracture: Secondary | ICD-10-CM | POA: Diagnosis not present

## 2018-10-12 DIAGNOSIS — F411 Generalized anxiety disorder: Secondary | ICD-10-CM | POA: Diagnosis not present

## 2018-10-12 DIAGNOSIS — Z87440 Personal history of urinary (tract) infections: Secondary | ICD-10-CM | POA: Diagnosis not present

## 2018-10-12 DIAGNOSIS — G8222 Paraplegia, incomplete: Secondary | ICD-10-CM | POA: Diagnosis not present

## 2018-10-12 DIAGNOSIS — Z466 Encounter for fitting and adjustment of urinary device: Secondary | ICD-10-CM | POA: Diagnosis not present

## 2018-10-14 DIAGNOSIS — R5381 Other malaise: Secondary | ICD-10-CM | POA: Diagnosis not present

## 2018-10-14 DIAGNOSIS — Z87442 Personal history of urinary calculi: Secondary | ICD-10-CM | POA: Diagnosis not present

## 2018-10-14 DIAGNOSIS — N200 Calculus of kidney: Secondary | ICD-10-CM | POA: Diagnosis not present

## 2018-10-14 DIAGNOSIS — Z743 Need for continuous supervision: Secondary | ICD-10-CM | POA: Diagnosis not present

## 2018-10-24 DIAGNOSIS — S24139D Anterior cord syndrome at unspecified level of thoracic spinal cord, subsequent encounter: Secondary | ICD-10-CM | POA: Diagnosis not present

## 2018-10-24 DIAGNOSIS — Z79899 Other long term (current) drug therapy: Secondary | ICD-10-CM | POA: Diagnosis not present

## 2018-10-24 DIAGNOSIS — R279 Unspecified lack of coordination: Secondary | ICD-10-CM | POA: Diagnosis not present

## 2018-10-24 DIAGNOSIS — M5134 Other intervertebral disc degeneration, thoracic region: Secondary | ICD-10-CM | POA: Diagnosis not present

## 2018-10-24 DIAGNOSIS — M5136 Other intervertebral disc degeneration, lumbar region: Secondary | ICD-10-CM | POA: Diagnosis not present

## 2018-10-31 DIAGNOSIS — Z466 Encounter for fitting and adjustment of urinary device: Secondary | ICD-10-CM | POA: Diagnosis not present

## 2018-10-31 DIAGNOSIS — F411 Generalized anxiety disorder: Secondary | ICD-10-CM | POA: Diagnosis not present

## 2018-10-31 DIAGNOSIS — G8222 Paraplegia, incomplete: Secondary | ICD-10-CM | POA: Diagnosis not present

## 2018-10-31 DIAGNOSIS — Z87440 Personal history of urinary (tract) infections: Secondary | ICD-10-CM | POA: Diagnosis not present

## 2018-11-01 ENCOUNTER — Ambulatory Visit: Payer: Medicare HMO | Admitting: Physical Therapy

## 2018-11-07 DIAGNOSIS — G8222 Paraplegia, incomplete: Secondary | ICD-10-CM | POA: Diagnosis not present

## 2018-11-07 DIAGNOSIS — L89813 Pressure ulcer of head, stage 3: Secondary | ICD-10-CM | POA: Diagnosis not present

## 2018-11-07 DIAGNOSIS — F411 Generalized anxiety disorder: Secondary | ICD-10-CM | POA: Diagnosis not present

## 2018-11-07 DIAGNOSIS — F320 Major depressive disorder, single episode, mild: Secondary | ICD-10-CM | POA: Diagnosis not present

## 2018-11-07 DIAGNOSIS — Z743 Need for continuous supervision: Secondary | ICD-10-CM | POA: Diagnosis not present

## 2018-11-07 DIAGNOSIS — Z1389 Encounter for screening for other disorder: Secondary | ICD-10-CM | POA: Diagnosis not present

## 2018-11-07 DIAGNOSIS — K210 Gastro-esophageal reflux disease with esophagitis: Secondary | ICD-10-CM | POA: Diagnosis not present

## 2018-11-07 DIAGNOSIS — M545 Low back pain: Secondary | ICD-10-CM | POA: Diagnosis not present

## 2018-11-07 DIAGNOSIS — Z0000 Encounter for general adult medical examination without abnormal findings: Secondary | ICD-10-CM | POA: Diagnosis not present

## 2018-11-07 DIAGNOSIS — R52 Pain, unspecified: Secondary | ICD-10-CM | POA: Diagnosis not present

## 2018-11-07 DIAGNOSIS — I10 Essential (primary) hypertension: Secondary | ICD-10-CM | POA: Diagnosis not present

## 2018-11-07 DIAGNOSIS — F900 Attention-deficit hyperactivity disorder, predominantly inattentive type: Secondary | ICD-10-CM | POA: Diagnosis not present

## 2018-11-09 DIAGNOSIS — Z7409 Other reduced mobility: Secondary | ICD-10-CM | POA: Diagnosis not present

## 2018-11-09 DIAGNOSIS — L89154 Pressure ulcer of sacral region, stage 4: Secondary | ICD-10-CM | POA: Diagnosis not present

## 2018-11-09 DIAGNOSIS — S12101A Unspecified nondisplaced fracture of second cervical vertebra, initial encounter for closed fracture: Secondary | ICD-10-CM | POA: Diagnosis not present

## 2018-11-09 DIAGNOSIS — Z466 Encounter for fitting and adjustment of urinary device: Secondary | ICD-10-CM | POA: Diagnosis not present

## 2018-11-09 DIAGNOSIS — G8222 Paraplegia, incomplete: Secondary | ICD-10-CM | POA: Diagnosis not present

## 2018-11-11 DIAGNOSIS — N210 Calculus in bladder: Secondary | ICD-10-CM | POA: Diagnosis not present

## 2018-11-11 DIAGNOSIS — R5381 Other malaise: Secondary | ICD-10-CM | POA: Diagnosis not present

## 2018-11-11 DIAGNOSIS — I2510 Atherosclerotic heart disease of native coronary artery without angina pectoris: Secondary | ICD-10-CM | POA: Diagnosis not present

## 2018-11-11 DIAGNOSIS — N200 Calculus of kidney: Secondary | ICD-10-CM | POA: Diagnosis not present

## 2018-11-11 DIAGNOSIS — K8020 Calculus of gallbladder without cholecystitis without obstruction: Secondary | ICD-10-CM | POA: Diagnosis not present

## 2018-11-11 DIAGNOSIS — Z7401 Bed confinement status: Secondary | ICD-10-CM | POA: Diagnosis not present

## 2018-11-11 DIAGNOSIS — I700 Atherosclerosis of aorta: Secondary | ICD-10-CM | POA: Diagnosis not present

## 2018-11-11 DIAGNOSIS — R29898 Other symptoms and signs involving the musculoskeletal system: Secondary | ICD-10-CM | POA: Diagnosis not present

## 2018-11-11 DIAGNOSIS — M6250 Muscle wasting and atrophy, not elsewhere classified, unspecified site: Secondary | ICD-10-CM | POA: Diagnosis not present

## 2018-11-11 DIAGNOSIS — G839 Paralytic syndrome, unspecified: Secondary | ICD-10-CM | POA: Diagnosis not present

## 2018-11-22 DIAGNOSIS — G8222 Paraplegia, incomplete: Secondary | ICD-10-CM | POA: Diagnosis not present

## 2018-11-22 DIAGNOSIS — F411 Generalized anxiety disorder: Secondary | ICD-10-CM | POA: Diagnosis not present

## 2018-11-22 DIAGNOSIS — Z466 Encounter for fitting and adjustment of urinary device: Secondary | ICD-10-CM | POA: Diagnosis not present

## 2018-11-22 DIAGNOSIS — Z87440 Personal history of urinary (tract) infections: Secondary | ICD-10-CM | POA: Diagnosis not present

## 2018-11-23 DIAGNOSIS — M5134 Other intervertebral disc degeneration, thoracic region: Secondary | ICD-10-CM | POA: Diagnosis not present

## 2018-11-23 DIAGNOSIS — Z79899 Other long term (current) drug therapy: Secondary | ICD-10-CM | POA: Diagnosis not present

## 2018-11-23 DIAGNOSIS — M5136 Other intervertebral disc degeneration, lumbar region: Secondary | ICD-10-CM | POA: Diagnosis not present

## 2018-11-23 DIAGNOSIS — M2550 Pain in unspecified joint: Secondary | ICD-10-CM | POA: Diagnosis not present

## 2018-11-23 DIAGNOSIS — Z7401 Bed confinement status: Secondary | ICD-10-CM | POA: Diagnosis not present

## 2018-11-23 DIAGNOSIS — R0902 Hypoxemia: Secondary | ICD-10-CM | POA: Diagnosis not present

## 2018-11-23 DIAGNOSIS — R5381 Other malaise: Secondary | ICD-10-CM | POA: Diagnosis not present

## 2018-11-23 DIAGNOSIS — S24139D Anterior cord syndrome at unspecified level of thoracic spinal cord, subsequent encounter: Secondary | ICD-10-CM | POA: Diagnosis not present

## 2018-11-28 ENCOUNTER — Other Ambulatory Visit: Payer: Self-pay | Admitting: Nurse Practitioner

## 2018-11-28 DIAGNOSIS — S24139D Anterior cord syndrome at unspecified level of thoracic spinal cord, subsequent encounter: Secondary | ICD-10-CM

## 2018-12-02 DIAGNOSIS — G8222 Paraplegia, incomplete: Secondary | ICD-10-CM | POA: Diagnosis not present

## 2018-12-02 DIAGNOSIS — Z87440 Personal history of urinary (tract) infections: Secondary | ICD-10-CM | POA: Diagnosis not present

## 2018-12-02 DIAGNOSIS — F411 Generalized anxiety disorder: Secondary | ICD-10-CM | POA: Diagnosis not present

## 2018-12-02 DIAGNOSIS — Z466 Encounter for fitting and adjustment of urinary device: Secondary | ICD-10-CM | POA: Diagnosis not present

## 2018-12-07 DIAGNOSIS — F411 Generalized anxiety disorder: Secondary | ICD-10-CM | POA: Diagnosis not present

## 2018-12-07 DIAGNOSIS — G8222 Paraplegia, incomplete: Secondary | ICD-10-CM | POA: Diagnosis not present

## 2018-12-07 DIAGNOSIS — Z466 Encounter for fitting and adjustment of urinary device: Secondary | ICD-10-CM | POA: Diagnosis not present

## 2018-12-07 DIAGNOSIS — Z87440 Personal history of urinary (tract) infections: Secondary | ICD-10-CM | POA: Diagnosis not present

## 2018-12-08 DIAGNOSIS — G8222 Paraplegia, incomplete: Secondary | ICD-10-CM | POA: Diagnosis not present

## 2018-12-08 DIAGNOSIS — S12101A Unspecified nondisplaced fracture of second cervical vertebra, initial encounter for closed fracture: Secondary | ICD-10-CM | POA: Diagnosis not present

## 2018-12-08 DIAGNOSIS — Z749 Problem related to care provider dependency, unspecified: Secondary | ICD-10-CM | POA: Diagnosis not present

## 2018-12-08 DIAGNOSIS — L89154 Pressure ulcer of sacral region, stage 4: Secondary | ICD-10-CM | POA: Diagnosis not present

## 2018-12-16 ENCOUNTER — Other Ambulatory Visit: Payer: Self-pay

## 2018-12-16 ENCOUNTER — Ambulatory Visit
Admission: RE | Admit: 2018-12-16 | Discharge: 2018-12-16 | Disposition: A | Discharge status: Home / Self Care | Payer: Medicare HMO | Source: Ambulatory Visit | Attending: Nurse Practitioner | Admitting: Nurse Practitioner

## 2018-12-16 DIAGNOSIS — I10 Essential (primary) hypertension: Secondary | ICD-10-CM | POA: Diagnosis not present

## 2018-12-16 DIAGNOSIS — Z7401 Bed confinement status: Secondary | ICD-10-CM | POA: Diagnosis not present

## 2018-12-16 DIAGNOSIS — R2241 Localized swelling, mass and lump, right lower limb: Secondary | ICD-10-CM | POA: Diagnosis not present

## 2018-12-16 DIAGNOSIS — S24139D Anterior cord syndrome at unspecified level of thoracic spinal cord, subsequent encounter: Secondary | ICD-10-CM

## 2018-12-16 DIAGNOSIS — M2550 Pain in unspecified joint: Secondary | ICD-10-CM | POA: Diagnosis not present

## 2018-12-20 DIAGNOSIS — M5136 Other intervertebral disc degeneration, lumbar region: Secondary | ICD-10-CM | POA: Diagnosis not present

## 2018-12-20 DIAGNOSIS — Z79899 Other long term (current) drug therapy: Secondary | ICD-10-CM | POA: Diagnosis not present

## 2018-12-20 DIAGNOSIS — K5903 Drug induced constipation: Secondary | ICD-10-CM | POA: Diagnosis not present

## 2018-12-20 DIAGNOSIS — Z049 Encounter for examination and observation for unspecified reason: Secondary | ICD-10-CM | POA: Diagnosis not present

## 2018-12-20 DIAGNOSIS — Z7401 Bed confinement status: Secondary | ICD-10-CM | POA: Diagnosis not present

## 2018-12-20 DIAGNOSIS — M5134 Other intervertebral disc degeneration, thoracic region: Secondary | ICD-10-CM | POA: Diagnosis not present

## 2018-12-20 DIAGNOSIS — S24139D Anterior cord syndrome at unspecified level of thoracic spinal cord, subsequent encounter: Secondary | ICD-10-CM | POA: Diagnosis not present

## 2018-12-20 DIAGNOSIS — R52 Pain, unspecified: Secondary | ICD-10-CM | POA: Diagnosis not present

## 2018-12-21 DIAGNOSIS — R5381 Other malaise: Secondary | ICD-10-CM | POA: Diagnosis not present

## 2018-12-21 DIAGNOSIS — Z7401 Bed confinement status: Secondary | ICD-10-CM | POA: Diagnosis not present

## 2018-12-21 DIAGNOSIS — N210 Calculus in bladder: Secondary | ICD-10-CM | POA: Diagnosis not present

## 2018-12-21 DIAGNOSIS — N200 Calculus of kidney: Secondary | ICD-10-CM | POA: Diagnosis not present

## 2018-12-21 DIAGNOSIS — R279 Unspecified lack of coordination: Secondary | ICD-10-CM | POA: Diagnosis not present

## 2018-12-28 DIAGNOSIS — Z466 Encounter for fitting and adjustment of urinary device: Secondary | ICD-10-CM | POA: Diagnosis not present

## 2018-12-28 DIAGNOSIS — G8222 Paraplegia, incomplete: Secondary | ICD-10-CM | POA: Diagnosis not present

## 2018-12-28 DIAGNOSIS — F411 Generalized anxiety disorder: Secondary | ICD-10-CM | POA: Diagnosis not present

## 2018-12-28 DIAGNOSIS — Z87440 Personal history of urinary (tract) infections: Secondary | ICD-10-CM | POA: Diagnosis not present

## 2019-01-03 DIAGNOSIS — F411 Generalized anxiety disorder: Secondary | ICD-10-CM | POA: Diagnosis not present

## 2019-01-03 DIAGNOSIS — Z466 Encounter for fitting and adjustment of urinary device: Secondary | ICD-10-CM | POA: Diagnosis not present

## 2019-01-03 DIAGNOSIS — G8222 Paraplegia, incomplete: Secondary | ICD-10-CM | POA: Diagnosis not present

## 2019-01-03 DIAGNOSIS — Z87440 Personal history of urinary (tract) infections: Secondary | ICD-10-CM | POA: Diagnosis not present

## 2019-01-08 DIAGNOSIS — Z749 Problem related to care provider dependency, unspecified: Secondary | ICD-10-CM | POA: Diagnosis not present

## 2019-01-08 DIAGNOSIS — S12101A Unspecified nondisplaced fracture of second cervical vertebra, initial encounter for closed fracture: Secondary | ICD-10-CM | POA: Diagnosis not present

## 2019-01-08 DIAGNOSIS — G8222 Paraplegia, incomplete: Secondary | ICD-10-CM | POA: Diagnosis not present

## 2019-01-08 DIAGNOSIS — L89154 Pressure ulcer of sacral region, stage 4: Secondary | ICD-10-CM | POA: Diagnosis not present

## 2019-01-12 DIAGNOSIS — Z87440 Personal history of urinary (tract) infections: Secondary | ICD-10-CM | POA: Diagnosis not present

## 2019-01-12 DIAGNOSIS — G8222 Paraplegia, incomplete: Secondary | ICD-10-CM | POA: Diagnosis not present

## 2019-01-12 DIAGNOSIS — F411 Generalized anxiety disorder: Secondary | ICD-10-CM | POA: Diagnosis not present

## 2019-01-12 DIAGNOSIS — Z466 Encounter for fitting and adjustment of urinary device: Secondary | ICD-10-CM | POA: Diagnosis not present

## 2019-01-18 DIAGNOSIS — Z79899 Other long term (current) drug therapy: Secondary | ICD-10-CM | POA: Diagnosis not present

## 2019-01-18 DIAGNOSIS — R531 Weakness: Secondary | ICD-10-CM | POA: Diagnosis not present

## 2019-01-18 DIAGNOSIS — R69 Illness, unspecified: Secondary | ICD-10-CM | POA: Diagnosis not present

## 2019-01-18 DIAGNOSIS — R5381 Other malaise: Secondary | ICD-10-CM | POA: Diagnosis not present

## 2019-01-18 DIAGNOSIS — S24139D Anterior cord syndrome at unspecified level of thoracic spinal cord, subsequent encounter: Secondary | ICD-10-CM | POA: Diagnosis not present

## 2019-01-18 DIAGNOSIS — Z7401 Bed confinement status: Secondary | ICD-10-CM | POA: Diagnosis not present

## 2019-01-18 DIAGNOSIS — M5134 Other intervertebral disc degeneration, thoracic region: Secondary | ICD-10-CM | POA: Diagnosis not present

## 2019-01-18 DIAGNOSIS — M5136 Other intervertebral disc degeneration, lumbar region: Secondary | ICD-10-CM | POA: Diagnosis not present

## 2019-01-31 DIAGNOSIS — I700 Atherosclerosis of aorta: Secondary | ICD-10-CM | POA: Diagnosis not present

## 2019-01-31 DIAGNOSIS — R279 Unspecified lack of coordination: Secondary | ICD-10-CM | POA: Diagnosis not present

## 2019-01-31 DIAGNOSIS — Z7401 Bed confinement status: Secondary | ICD-10-CM | POA: Diagnosis not present

## 2019-01-31 DIAGNOSIS — R5381 Other malaise: Secondary | ICD-10-CM | POA: Diagnosis not present

## 2019-01-31 DIAGNOSIS — Z87440 Personal history of urinary (tract) infections: Secondary | ICD-10-CM | POA: Diagnosis not present

## 2019-01-31 DIAGNOSIS — N200 Calculus of kidney: Secondary | ICD-10-CM | POA: Diagnosis not present

## 2019-01-31 DIAGNOSIS — N210 Calculus in bladder: Secondary | ICD-10-CM | POA: Diagnosis not present

## 2019-01-31 DIAGNOSIS — K8020 Calculus of gallbladder without cholecystitis without obstruction: Secondary | ICD-10-CM | POA: Diagnosis not present

## 2019-01-31 DIAGNOSIS — R0902 Hypoxemia: Secondary | ICD-10-CM | POA: Diagnosis not present

## 2019-01-31 DIAGNOSIS — F411 Generalized anxiety disorder: Secondary | ICD-10-CM | POA: Diagnosis not present

## 2019-01-31 DIAGNOSIS — Z466 Encounter for fitting and adjustment of urinary device: Secondary | ICD-10-CM | POA: Diagnosis not present

## 2019-01-31 DIAGNOSIS — G8222 Paraplegia, incomplete: Secondary | ICD-10-CM | POA: Diagnosis not present

## 2019-02-01 DIAGNOSIS — F411 Generalized anxiety disorder: Secondary | ICD-10-CM | POA: Diagnosis not present

## 2019-02-01 DIAGNOSIS — Z87440 Personal history of urinary (tract) infections: Secondary | ICD-10-CM | POA: Diagnosis not present

## 2019-02-01 DIAGNOSIS — G8222 Paraplegia, incomplete: Secondary | ICD-10-CM | POA: Diagnosis not present

## 2019-02-01 DIAGNOSIS — Z466 Encounter for fitting and adjustment of urinary device: Secondary | ICD-10-CM | POA: Diagnosis not present

## 2019-02-05 DIAGNOSIS — Z466 Encounter for fitting and adjustment of urinary device: Secondary | ICD-10-CM | POA: Diagnosis not present

## 2019-02-05 DIAGNOSIS — G8222 Paraplegia, incomplete: Secondary | ICD-10-CM | POA: Diagnosis not present

## 2019-02-05 DIAGNOSIS — F411 Generalized anxiety disorder: Secondary | ICD-10-CM | POA: Diagnosis not present

## 2019-02-05 DIAGNOSIS — Z87440 Personal history of urinary (tract) infections: Secondary | ICD-10-CM | POA: Diagnosis not present

## 2019-02-07 DIAGNOSIS — Z749 Problem related to care provider dependency, unspecified: Secondary | ICD-10-CM | POA: Diagnosis not present

## 2019-02-07 DIAGNOSIS — G8222 Paraplegia, incomplete: Secondary | ICD-10-CM | POA: Diagnosis not present

## 2019-02-07 DIAGNOSIS — L89154 Pressure ulcer of sacral region, stage 4: Secondary | ICD-10-CM | POA: Diagnosis not present

## 2019-02-07 DIAGNOSIS — S12101A Unspecified nondisplaced fracture of second cervical vertebra, initial encounter for closed fracture: Secondary | ICD-10-CM | POA: Diagnosis not present

## 2019-02-08 DIAGNOSIS — G8222 Paraplegia, incomplete: Secondary | ICD-10-CM | POA: Diagnosis not present

## 2019-02-08 DIAGNOSIS — Z466 Encounter for fitting and adjustment of urinary device: Secondary | ICD-10-CM | POA: Diagnosis not present

## 2019-02-08 DIAGNOSIS — Z87440 Personal history of urinary (tract) infections: Secondary | ICD-10-CM | POA: Diagnosis not present

## 2019-02-08 DIAGNOSIS — F411 Generalized anxiety disorder: Secondary | ICD-10-CM | POA: Diagnosis not present

## 2019-02-24 DIAGNOSIS — K210 Gastro-esophageal reflux disease with esophagitis: Secondary | ICD-10-CM | POA: Diagnosis not present

## 2019-02-24 DIAGNOSIS — L89813 Pressure ulcer of head, stage 3: Secondary | ICD-10-CM | POA: Diagnosis not present

## 2019-02-24 DIAGNOSIS — F900 Attention-deficit hyperactivity disorder, predominantly inattentive type: Secondary | ICD-10-CM | POA: Diagnosis not present

## 2019-02-24 DIAGNOSIS — F320 Major depressive disorder, single episode, mild: Secondary | ICD-10-CM | POA: Diagnosis not present

## 2019-02-24 DIAGNOSIS — M545 Low back pain: Secondary | ICD-10-CM | POA: Diagnosis not present

## 2019-02-24 DIAGNOSIS — G8222 Paraplegia, incomplete: Secondary | ICD-10-CM | POA: Diagnosis not present

## 2019-02-24 DIAGNOSIS — F411 Generalized anxiety disorder: Secondary | ICD-10-CM | POA: Diagnosis not present

## 2019-03-04 DIAGNOSIS — F411 Generalized anxiety disorder: Secondary | ICD-10-CM | POA: Diagnosis not present

## 2019-03-04 DIAGNOSIS — Z87440 Personal history of urinary (tract) infections: Secondary | ICD-10-CM | POA: Diagnosis not present

## 2019-03-04 DIAGNOSIS — Z466 Encounter for fitting and adjustment of urinary device: Secondary | ICD-10-CM | POA: Diagnosis not present

## 2019-03-04 DIAGNOSIS — G8222 Paraplegia, incomplete: Secondary | ICD-10-CM | POA: Diagnosis not present

## 2019-03-08 DIAGNOSIS — G8222 Paraplegia, incomplete: Secondary | ICD-10-CM | POA: Diagnosis not present

## 2019-03-08 DIAGNOSIS — F411 Generalized anxiety disorder: Secondary | ICD-10-CM | POA: Diagnosis not present

## 2019-03-08 DIAGNOSIS — Z87440 Personal history of urinary (tract) infections: Secondary | ICD-10-CM | POA: Diagnosis not present

## 2019-03-08 DIAGNOSIS — Z466 Encounter for fitting and adjustment of urinary device: Secondary | ICD-10-CM | POA: Diagnosis not present

## 2019-03-09 DIAGNOSIS — F411 Generalized anxiety disorder: Secondary | ICD-10-CM | POA: Diagnosis not present

## 2019-03-09 DIAGNOSIS — Z87440 Personal history of urinary (tract) infections: Secondary | ICD-10-CM | POA: Diagnosis not present

## 2019-03-09 DIAGNOSIS — G8222 Paraplegia, incomplete: Secondary | ICD-10-CM | POA: Diagnosis not present

## 2019-03-09 DIAGNOSIS — Z466 Encounter for fitting and adjustment of urinary device: Secondary | ICD-10-CM | POA: Diagnosis not present

## 2019-03-10 DIAGNOSIS — Z749 Problem related to care provider dependency, unspecified: Secondary | ICD-10-CM | POA: Diagnosis not present

## 2019-03-10 DIAGNOSIS — G8222 Paraplegia, incomplete: Secondary | ICD-10-CM | POA: Diagnosis not present

## 2019-03-10 DIAGNOSIS — L89154 Pressure ulcer of sacral region, stage 4: Secondary | ICD-10-CM | POA: Diagnosis not present

## 2019-03-10 DIAGNOSIS — S12101A Unspecified nondisplaced fracture of second cervical vertebra, initial encounter for closed fracture: Secondary | ICD-10-CM | POA: Diagnosis not present

## 2019-03-15 DIAGNOSIS — R339 Retention of urine, unspecified: Secondary | ICD-10-CM | POA: Diagnosis not present

## 2019-03-15 DIAGNOSIS — N210 Calculus in bladder: Secondary | ICD-10-CM | POA: Diagnosis not present

## 2019-03-15 DIAGNOSIS — N202 Calculus of kidney with calculus of ureter: Secondary | ICD-10-CM | POA: Diagnosis not present

## 2019-03-15 DIAGNOSIS — N390 Urinary tract infection, site not specified: Secondary | ICD-10-CM | POA: Diagnosis not present

## 2019-03-15 DIAGNOSIS — N319 Neuromuscular dysfunction of bladder, unspecified: Secondary | ICD-10-CM | POA: Diagnosis not present

## 2019-03-15 DIAGNOSIS — Z79891 Long term (current) use of opiate analgesic: Secondary | ICD-10-CM | POA: Diagnosis not present

## 2019-03-15 DIAGNOSIS — S066X1A Traumatic subarachnoid hemorrhage with loss of consciousness of 30 minutes or less, initial encounter: Secondary | ICD-10-CM | POA: Diagnosis not present

## 2019-03-15 DIAGNOSIS — R5381 Other malaise: Secondary | ICD-10-CM | POA: Diagnosis not present

## 2019-03-15 DIAGNOSIS — T8144XA Sepsis following a procedure, initial encounter: Secondary | ICD-10-CM | POA: Diagnosis not present

## 2019-03-15 DIAGNOSIS — E869 Volume depletion, unspecified: Secondary | ICD-10-CM | POA: Diagnosis not present

## 2019-03-15 DIAGNOSIS — G8222 Paraplegia, incomplete: Secondary | ICD-10-CM | POA: Diagnosis not present

## 2019-03-15 DIAGNOSIS — T83028A Displacement of other indwelling urethral catheter, initial encounter: Secondary | ICD-10-CM | POA: Diagnosis not present

## 2019-03-15 DIAGNOSIS — N368 Other specified disorders of urethra: Secondary | ICD-10-CM | POA: Diagnosis not present

## 2019-03-15 DIAGNOSIS — A4102 Sepsis due to Methicillin resistant Staphylococcus aureus: Secondary | ICD-10-CM | POA: Diagnosis not present

## 2019-03-15 DIAGNOSIS — F411 Generalized anxiety disorder: Secondary | ICD-10-CM | POA: Diagnosis not present

## 2019-03-15 DIAGNOSIS — N200 Calculus of kidney: Secondary | ICD-10-CM | POA: Diagnosis not present

## 2019-03-15 DIAGNOSIS — R000 Tachycardia, unspecified: Secondary | ICD-10-CM | POA: Diagnosis not present

## 2019-03-15 DIAGNOSIS — R58 Hemorrhage, not elsewhere classified: Secondary | ICD-10-CM | POA: Diagnosis not present

## 2019-03-15 DIAGNOSIS — B9562 Methicillin resistant Staphylococcus aureus infection as the cause of diseases classified elsewhere: Secondary | ICD-10-CM | POA: Diagnosis not present

## 2019-03-15 DIAGNOSIS — F419 Anxiety disorder, unspecified: Secondary | ICD-10-CM | POA: Diagnosis not present

## 2019-03-15 DIAGNOSIS — R7881 Bacteremia: Secondary | ICD-10-CM | POA: Diagnosis not present

## 2019-03-15 DIAGNOSIS — T839XXD Unspecified complication of genitourinary prosthetic device, implant and graft, subsequent encounter: Secondary | ICD-10-CM | POA: Diagnosis not present

## 2019-03-15 DIAGNOSIS — M6281 Muscle weakness (generalized): Secondary | ICD-10-CM | POA: Diagnosis not present

## 2019-03-15 DIAGNOSIS — I959 Hypotension, unspecified: Secondary | ICD-10-CM | POA: Diagnosis not present

## 2019-03-15 DIAGNOSIS — A499 Bacterial infection, unspecified: Secondary | ICD-10-CM | POA: Diagnosis not present

## 2019-03-15 DIAGNOSIS — S3730XA Unspecified injury of urethra, initial encounter: Secondary | ICD-10-CM | POA: Diagnosis not present

## 2019-03-15 DIAGNOSIS — M5134 Other intervertebral disc degeneration, thoracic region: Secondary | ICD-10-CM | POA: Diagnosis not present

## 2019-03-15 DIAGNOSIS — S14109A Unspecified injury at unspecified level of cervical spinal cord, initial encounter: Secondary | ICD-10-CM | POA: Diagnosis not present

## 2019-03-15 DIAGNOSIS — G8929 Other chronic pain: Secondary | ICD-10-CM | POA: Diagnosis not present

## 2019-03-15 DIAGNOSIS — R310 Gross hematuria: Secondary | ICD-10-CM | POA: Diagnosis not present

## 2019-03-15 DIAGNOSIS — F17200 Nicotine dependence, unspecified, uncomplicated: Secondary | ICD-10-CM | POA: Diagnosis not present

## 2019-03-15 DIAGNOSIS — Z20828 Contact with and (suspected) exposure to other viral communicable diseases: Secondary | ICD-10-CM | POA: Diagnosis not present

## 2019-03-15 DIAGNOSIS — R279 Unspecified lack of coordination: Secondary | ICD-10-CM | POA: Diagnosis not present

## 2019-03-15 DIAGNOSIS — Z960 Presence of urogenital implants: Secondary | ICD-10-CM | POA: Diagnosis not present

## 2019-03-15 DIAGNOSIS — Z466 Encounter for fitting and adjustment of urinary device: Secondary | ICD-10-CM | POA: Diagnosis not present

## 2019-03-15 DIAGNOSIS — D72829 Elevated white blood cell count, unspecified: Secondary | ICD-10-CM | POA: Diagnosis not present

## 2019-03-15 DIAGNOSIS — Z87828 Personal history of other (healed) physical injury and trauma: Secondary | ICD-10-CM | POA: Diagnosis not present

## 2019-03-15 DIAGNOSIS — S24139D Anterior cord syndrome at unspecified level of thoracic spinal cord, subsequent encounter: Secondary | ICD-10-CM | POA: Diagnosis not present

## 2019-03-15 DIAGNOSIS — K8020 Calculus of gallbladder without cholecystitis without obstruction: Secondary | ICD-10-CM | POA: Diagnosis not present

## 2019-03-15 DIAGNOSIS — Z79899 Other long term (current) drug therapy: Secondary | ICD-10-CM | POA: Diagnosis not present

## 2019-03-15 DIAGNOSIS — S39848A Other specified injuries of external genitals, initial encounter: Secondary | ICD-10-CM | POA: Diagnosis not present

## 2019-03-15 DIAGNOSIS — T83098D Other mechanical complication of other indwelling urethral catheter, subsequent encounter: Secondary | ICD-10-CM | POA: Diagnosis not present

## 2019-03-15 DIAGNOSIS — G629 Polyneuropathy, unspecified: Secondary | ICD-10-CM | POA: Diagnosis not present

## 2019-03-15 DIAGNOSIS — T8143XA Infection following a procedure, organ and space surgical site, initial encounter: Secondary | ICD-10-CM | POA: Diagnosis not present

## 2019-03-15 DIAGNOSIS — F329 Major depressive disorder, single episode, unspecified: Secondary | ICD-10-CM | POA: Diagnosis not present

## 2019-03-15 DIAGNOSIS — T83511A Infection and inflammatory reaction due to indwelling urethral catheter, initial encounter: Secondary | ICD-10-CM | POA: Diagnosis not present

## 2019-03-15 DIAGNOSIS — R1031 Right lower quadrant pain: Secondary | ICD-10-CM | POA: Diagnosis not present

## 2019-03-15 DIAGNOSIS — M5136 Other intervertebral disc degeneration, lumbar region: Secondary | ICD-10-CM | POA: Diagnosis not present

## 2019-03-15 DIAGNOSIS — M545 Low back pain: Secondary | ICD-10-CM | POA: Diagnosis not present

## 2019-03-15 DIAGNOSIS — Z87440 Personal history of urinary (tract) infections: Secondary | ICD-10-CM | POA: Diagnosis not present

## 2019-03-21 DIAGNOSIS — S24139D Anterior cord syndrome at unspecified level of thoracic spinal cord, subsequent encounter: Secondary | ICD-10-CM | POA: Diagnosis not present

## 2019-03-21 DIAGNOSIS — M5134 Other intervertebral disc degeneration, thoracic region: Secondary | ICD-10-CM | POA: Diagnosis not present

## 2019-03-21 DIAGNOSIS — N319 Neuromuscular dysfunction of bladder, unspecified: Secondary | ICD-10-CM | POA: Diagnosis not present

## 2019-03-21 DIAGNOSIS — B9562 Methicillin resistant Staphylococcus aureus infection as the cause of diseases classified elsewhere: Secondary | ICD-10-CM | POA: Diagnosis not present

## 2019-03-21 DIAGNOSIS — M5136 Other intervertebral disc degeneration, lumbar region: Secondary | ICD-10-CM | POA: Diagnosis not present

## 2019-03-21 DIAGNOSIS — Z87440 Personal history of urinary (tract) infections: Secondary | ICD-10-CM | POA: Diagnosis not present

## 2019-03-21 DIAGNOSIS — G8222 Paraplegia, incomplete: Secondary | ICD-10-CM | POA: Diagnosis not present

## 2019-03-21 DIAGNOSIS — Z466 Encounter for fitting and adjustment of urinary device: Secondary | ICD-10-CM | POA: Diagnosis not present

## 2019-03-21 DIAGNOSIS — N390 Urinary tract infection, site not specified: Secondary | ICD-10-CM | POA: Diagnosis not present

## 2019-03-21 DIAGNOSIS — Z79899 Other long term (current) drug therapy: Secondary | ICD-10-CM | POA: Diagnosis not present

## 2019-03-21 DIAGNOSIS — F411 Generalized anxiety disorder: Secondary | ICD-10-CM | POA: Diagnosis not present

## 2019-03-23 DIAGNOSIS — F411 Generalized anxiety disorder: Secondary | ICD-10-CM | POA: Diagnosis not present

## 2019-03-23 DIAGNOSIS — Z87440 Personal history of urinary (tract) infections: Secondary | ICD-10-CM | POA: Diagnosis not present

## 2019-03-23 DIAGNOSIS — G8222 Paraplegia, incomplete: Secondary | ICD-10-CM | POA: Diagnosis not present

## 2019-03-23 DIAGNOSIS — Z466 Encounter for fitting and adjustment of urinary device: Secondary | ICD-10-CM | POA: Diagnosis not present

## 2019-03-25 DIAGNOSIS — N319 Neuromuscular dysfunction of bladder, unspecified: Secondary | ICD-10-CM | POA: Diagnosis not present

## 2019-03-25 DIAGNOSIS — N390 Urinary tract infection, site not specified: Secondary | ICD-10-CM | POA: Diagnosis not present

## 2019-03-25 DIAGNOSIS — B9562 Methicillin resistant Staphylococcus aureus infection as the cause of diseases classified elsewhere: Secondary | ICD-10-CM | POA: Diagnosis not present

## 2019-03-27 DIAGNOSIS — B9562 Methicillin resistant Staphylococcus aureus infection as the cause of diseases classified elsewhere: Secondary | ICD-10-CM | POA: Diagnosis not present

## 2019-03-27 DIAGNOSIS — G8222 Paraplegia, incomplete: Secondary | ICD-10-CM | POA: Diagnosis not present

## 2019-03-27 DIAGNOSIS — F411 Generalized anxiety disorder: Secondary | ICD-10-CM | POA: Diagnosis not present

## 2019-03-27 DIAGNOSIS — Z792 Long term (current) use of antibiotics: Secondary | ICD-10-CM | POA: Diagnosis not present

## 2019-03-27 DIAGNOSIS — Z87440 Personal history of urinary (tract) infections: Secondary | ICD-10-CM | POA: Diagnosis not present

## 2019-03-27 DIAGNOSIS — Z466 Encounter for fitting and adjustment of urinary device: Secondary | ICD-10-CM | POA: Diagnosis not present

## 2019-03-29 DIAGNOSIS — N319 Neuromuscular dysfunction of bladder, unspecified: Secondary | ICD-10-CM | POA: Diagnosis not present

## 2019-03-29 DIAGNOSIS — N390 Urinary tract infection, site not specified: Secondary | ICD-10-CM | POA: Diagnosis not present

## 2019-03-29 DIAGNOSIS — B9562 Methicillin resistant Staphylococcus aureus infection as the cause of diseases classified elsewhere: Secondary | ICD-10-CM | POA: Diagnosis not present

## 2019-04-02 DIAGNOSIS — N319 Neuromuscular dysfunction of bladder, unspecified: Secondary | ICD-10-CM | POA: Diagnosis not present

## 2019-04-02 DIAGNOSIS — N390 Urinary tract infection, site not specified: Secondary | ICD-10-CM | POA: Diagnosis not present

## 2019-04-02 DIAGNOSIS — B9562 Methicillin resistant Staphylococcus aureus infection as the cause of diseases classified elsewhere: Secondary | ICD-10-CM | POA: Diagnosis not present

## 2019-04-04 DIAGNOSIS — Z466 Encounter for fitting and adjustment of urinary device: Secondary | ICD-10-CM | POA: Diagnosis not present

## 2019-04-04 DIAGNOSIS — Z87440 Personal history of urinary (tract) infections: Secondary | ICD-10-CM | POA: Diagnosis not present

## 2019-04-04 DIAGNOSIS — R339 Retention of urine, unspecified: Secondary | ICD-10-CM | POA: Diagnosis not present

## 2019-04-04 DIAGNOSIS — B9562 Methicillin resistant Staphylococcus aureus infection as the cause of diseases classified elsewhere: Secondary | ICD-10-CM | POA: Diagnosis not present

## 2019-04-04 DIAGNOSIS — F411 Generalized anxiety disorder: Secondary | ICD-10-CM | POA: Diagnosis not present

## 2019-04-04 DIAGNOSIS — G8222 Paraplegia, incomplete: Secondary | ICD-10-CM | POA: Diagnosis not present

## 2019-04-06 DIAGNOSIS — B9562 Methicillin resistant Staphylococcus aureus infection as the cause of diseases classified elsewhere: Secondary | ICD-10-CM | POA: Diagnosis not present

## 2019-04-06 DIAGNOSIS — Z5181 Encounter for therapeutic drug level monitoring: Secondary | ICD-10-CM | POA: Diagnosis not present

## 2019-04-06 DIAGNOSIS — N390 Urinary tract infection, site not specified: Secondary | ICD-10-CM | POA: Diagnosis not present

## 2019-04-06 DIAGNOSIS — Z466 Encounter for fitting and adjustment of urinary device: Secondary | ICD-10-CM | POA: Diagnosis not present

## 2019-04-06 DIAGNOSIS — Z792 Long term (current) use of antibiotics: Secondary | ICD-10-CM | POA: Diagnosis not present

## 2019-04-06 DIAGNOSIS — Z452 Encounter for adjustment and management of vascular access device: Secondary | ICD-10-CM | POA: Diagnosis not present

## 2019-04-06 DIAGNOSIS — G8222 Paraplegia, incomplete: Secondary | ICD-10-CM | POA: Diagnosis not present

## 2019-04-06 DIAGNOSIS — R339 Retention of urine, unspecified: Secondary | ICD-10-CM | POA: Diagnosis not present

## 2019-04-09 DIAGNOSIS — Z749 Problem related to care provider dependency, unspecified: Secondary | ICD-10-CM | POA: Diagnosis not present

## 2019-04-09 DIAGNOSIS — L89154 Pressure ulcer of sacral region, stage 4: Secondary | ICD-10-CM | POA: Diagnosis not present

## 2019-04-09 DIAGNOSIS — S12101A Unspecified nondisplaced fracture of second cervical vertebra, initial encounter for closed fracture: Secondary | ICD-10-CM | POA: Diagnosis not present

## 2019-04-09 DIAGNOSIS — G8222 Paraplegia, incomplete: Secondary | ICD-10-CM | POA: Diagnosis not present

## 2019-04-11 DIAGNOSIS — R339 Retention of urine, unspecified: Secondary | ICD-10-CM | POA: Diagnosis not present

## 2019-04-11 DIAGNOSIS — Z452 Encounter for adjustment and management of vascular access device: Secondary | ICD-10-CM | POA: Diagnosis not present

## 2019-04-11 DIAGNOSIS — Z792 Long term (current) use of antibiotics: Secondary | ICD-10-CM | POA: Diagnosis not present

## 2019-04-11 DIAGNOSIS — Z5181 Encounter for therapeutic drug level monitoring: Secondary | ICD-10-CM | POA: Diagnosis not present

## 2019-04-11 DIAGNOSIS — B9562 Methicillin resistant Staphylococcus aureus infection as the cause of diseases classified elsewhere: Secondary | ICD-10-CM | POA: Diagnosis not present

## 2019-04-11 DIAGNOSIS — G8222 Paraplegia, incomplete: Secondary | ICD-10-CM | POA: Diagnosis not present

## 2019-04-11 DIAGNOSIS — N390 Urinary tract infection, site not specified: Secondary | ICD-10-CM | POA: Diagnosis not present

## 2019-04-11 DIAGNOSIS — Z466 Encounter for fitting and adjustment of urinary device: Secondary | ICD-10-CM | POA: Diagnosis not present

## 2019-04-18 DIAGNOSIS — Z7401 Bed confinement status: Secondary | ICD-10-CM | POA: Diagnosis not present

## 2019-04-18 DIAGNOSIS — M5134 Other intervertebral disc degeneration, thoracic region: Secondary | ICD-10-CM | POA: Diagnosis not present

## 2019-04-18 DIAGNOSIS — M5136 Other intervertebral disc degeneration, lumbar region: Secondary | ICD-10-CM | POA: Diagnosis not present

## 2019-04-18 DIAGNOSIS — S24139D Anterior cord syndrome at unspecified level of thoracic spinal cord, subsequent encounter: Secondary | ICD-10-CM | POA: Diagnosis not present

## 2019-04-18 DIAGNOSIS — R52 Pain, unspecified: Secondary | ICD-10-CM | POA: Diagnosis not present

## 2019-04-18 DIAGNOSIS — Z79899 Other long term (current) drug therapy: Secondary | ICD-10-CM | POA: Diagnosis not present

## 2019-04-24 DIAGNOSIS — N319 Neuromuscular dysfunction of bladder, unspecified: Secondary | ICD-10-CM | POA: Diagnosis not present

## 2019-04-24 DIAGNOSIS — R8290 Unspecified abnormal findings in urine: Secondary | ICD-10-CM | POA: Diagnosis not present

## 2019-04-26 DIAGNOSIS — Z7401 Bed confinement status: Secondary | ICD-10-CM | POA: Diagnosis not present

## 2019-04-26 DIAGNOSIS — M5416 Radiculopathy, lumbar region: Secondary | ICD-10-CM | POA: Diagnosis not present

## 2019-04-26 DIAGNOSIS — G950 Syringomyelia and syringobulbia: Secondary | ICD-10-CM | POA: Diagnosis not present

## 2019-04-26 DIAGNOSIS — Z1159 Encounter for screening for other viral diseases: Secondary | ICD-10-CM | POA: Diagnosis not present

## 2019-04-26 DIAGNOSIS — R29898 Other symptoms and signs involving the musculoskeletal system: Secondary | ICD-10-CM | POA: Diagnosis not present

## 2019-04-26 DIAGNOSIS — R5381 Other malaise: Secondary | ICD-10-CM | POA: Diagnosis not present

## 2019-04-26 DIAGNOSIS — R0902 Hypoxemia: Secondary | ICD-10-CM | POA: Diagnosis not present

## 2019-04-27 DIAGNOSIS — G8222 Paraplegia, incomplete: Secondary | ICD-10-CM | POA: Diagnosis not present

## 2019-04-28 DIAGNOSIS — K219 Gastro-esophageal reflux disease without esophagitis: Secondary | ICD-10-CM | POA: Diagnosis not present

## 2019-04-28 DIAGNOSIS — N318 Other neuromuscular dysfunction of bladder: Secondary | ICD-10-CM | POA: Diagnosis not present

## 2019-04-28 DIAGNOSIS — Z79899 Other long term (current) drug therapy: Secondary | ICD-10-CM | POA: Diagnosis not present

## 2019-04-28 DIAGNOSIS — N200 Calculus of kidney: Secondary | ICD-10-CM | POA: Diagnosis not present

## 2019-04-28 DIAGNOSIS — F329 Major depressive disorder, single episode, unspecified: Secondary | ICD-10-CM | POA: Diagnosis not present

## 2019-04-28 DIAGNOSIS — G629 Polyneuropathy, unspecified: Secondary | ICD-10-CM | POA: Diagnosis not present

## 2019-04-28 DIAGNOSIS — Z6826 Body mass index (BMI) 26.0-26.9, adult: Secondary | ICD-10-CM | POA: Diagnosis not present

## 2019-04-28 DIAGNOSIS — E669 Obesity, unspecified: Secondary | ICD-10-CM | POA: Diagnosis not present

## 2019-04-28 DIAGNOSIS — N210 Calculus in bladder: Secondary | ICD-10-CM | POA: Diagnosis not present

## 2019-04-28 DIAGNOSIS — N319 Neuromuscular dysfunction of bladder, unspecified: Secondary | ICD-10-CM | POA: Diagnosis not present

## 2019-04-29 DIAGNOSIS — A499 Bacterial infection, unspecified: Secondary | ICD-10-CM | POA: Diagnosis not present

## 2019-04-29 DIAGNOSIS — N200 Calculus of kidney: Secondary | ICD-10-CM | POA: Diagnosis not present

## 2019-04-29 DIAGNOSIS — E669 Obesity, unspecified: Secondary | ICD-10-CM | POA: Diagnosis not present

## 2019-04-29 DIAGNOSIS — N319 Neuromuscular dysfunction of bladder, unspecified: Secondary | ICD-10-CM | POA: Diagnosis not present

## 2019-04-29 DIAGNOSIS — G629 Polyneuropathy, unspecified: Secondary | ICD-10-CM | POA: Diagnosis not present

## 2019-04-29 DIAGNOSIS — R5381 Other malaise: Secondary | ICD-10-CM | POA: Diagnosis not present

## 2019-04-29 DIAGNOSIS — N210 Calculus in bladder: Secondary | ICD-10-CM | POA: Diagnosis not present

## 2019-04-29 DIAGNOSIS — Z79899 Other long term (current) drug therapy: Secondary | ICD-10-CM | POA: Diagnosis not present

## 2019-04-29 DIAGNOSIS — R279 Unspecified lack of coordination: Secondary | ICD-10-CM | POA: Diagnosis not present

## 2019-04-29 DIAGNOSIS — K219 Gastro-esophageal reflux disease without esophagitis: Secondary | ICD-10-CM | POA: Diagnosis not present

## 2019-04-29 DIAGNOSIS — Z6826 Body mass index (BMI) 26.0-26.9, adult: Secondary | ICD-10-CM | POA: Diagnosis not present

## 2019-04-29 DIAGNOSIS — F329 Major depressive disorder, single episode, unspecified: Secondary | ICD-10-CM | POA: Diagnosis not present

## 2019-04-29 DIAGNOSIS — M6281 Muscle weakness (generalized): Secondary | ICD-10-CM | POA: Diagnosis not present

## 2019-05-03 DIAGNOSIS — N200 Calculus of kidney: Secondary | ICD-10-CM | POA: Diagnosis not present

## 2019-05-03 DIAGNOSIS — R29898 Other symptoms and signs involving the musculoskeletal system: Secondary | ICD-10-CM | POA: Diagnosis not present

## 2019-05-03 DIAGNOSIS — F329 Major depressive disorder, single episode, unspecified: Secondary | ICD-10-CM | POA: Diagnosis not present

## 2019-05-03 DIAGNOSIS — F17210 Nicotine dependence, cigarettes, uncomplicated: Secondary | ICD-10-CM | POA: Diagnosis not present

## 2019-05-03 DIAGNOSIS — Z7401 Bed confinement status: Secondary | ICD-10-CM | POA: Diagnosis not present

## 2019-05-03 DIAGNOSIS — K219 Gastro-esophageal reflux disease without esophagitis: Secondary | ICD-10-CM | POA: Diagnosis not present

## 2019-05-03 DIAGNOSIS — Z91040 Latex allergy status: Secondary | ICD-10-CM | POA: Diagnosis not present

## 2019-05-03 DIAGNOSIS — N319 Neuromuscular dysfunction of bladder, unspecified: Secondary | ICD-10-CM | POA: Diagnosis not present

## 2019-05-04 DIAGNOSIS — Z91040 Latex allergy status: Secondary | ICD-10-CM | POA: Diagnosis not present

## 2019-05-04 DIAGNOSIS — N200 Calculus of kidney: Secondary | ICD-10-CM | POA: Diagnosis not present

## 2019-05-04 DIAGNOSIS — N319 Neuromuscular dysfunction of bladder, unspecified: Secondary | ICD-10-CM | POA: Diagnosis not present

## 2019-05-04 DIAGNOSIS — F329 Major depressive disorder, single episode, unspecified: Secondary | ICD-10-CM | POA: Diagnosis not present

## 2019-05-04 DIAGNOSIS — R5381 Other malaise: Secondary | ICD-10-CM | POA: Diagnosis not present

## 2019-05-04 DIAGNOSIS — K219 Gastro-esophageal reflux disease without esophagitis: Secondary | ICD-10-CM | POA: Diagnosis not present

## 2019-05-04 DIAGNOSIS — M6281 Muscle weakness (generalized): Secondary | ICD-10-CM | POA: Diagnosis not present

## 2019-05-04 DIAGNOSIS — R279 Unspecified lack of coordination: Secondary | ICD-10-CM | POA: Diagnosis not present

## 2019-05-04 DIAGNOSIS — A499 Bacterial infection, unspecified: Secondary | ICD-10-CM | POA: Diagnosis not present

## 2019-05-04 DIAGNOSIS — F17210 Nicotine dependence, cigarettes, uncomplicated: Secondary | ICD-10-CM | POA: Diagnosis not present

## 2019-05-05 DIAGNOSIS — R339 Retention of urine, unspecified: Secondary | ICD-10-CM | POA: Diagnosis not present

## 2019-05-05 DIAGNOSIS — G8222 Paraplegia, incomplete: Secondary | ICD-10-CM | POA: Diagnosis not present

## 2019-05-05 DIAGNOSIS — Z466 Encounter for fitting and adjustment of urinary device: Secondary | ICD-10-CM | POA: Diagnosis not present

## 2019-05-05 DIAGNOSIS — N390 Urinary tract infection, site not specified: Secondary | ICD-10-CM | POA: Diagnosis not present

## 2019-05-05 DIAGNOSIS — B9562 Methicillin resistant Staphylococcus aureus infection as the cause of diseases classified elsewhere: Secondary | ICD-10-CM | POA: Diagnosis not present

## 2019-05-05 DIAGNOSIS — Z5181 Encounter for therapeutic drug level monitoring: Secondary | ICD-10-CM | POA: Diagnosis not present

## 2019-05-05 DIAGNOSIS — Z452 Encounter for adjustment and management of vascular access device: Secondary | ICD-10-CM | POA: Diagnosis not present

## 2019-05-05 DIAGNOSIS — Z792 Long term (current) use of antibiotics: Secondary | ICD-10-CM | POA: Diagnosis not present

## 2019-05-07 DIAGNOSIS — F17210 Nicotine dependence, cigarettes, uncomplicated: Secondary | ICD-10-CM | POA: Diagnosis not present

## 2019-05-07 DIAGNOSIS — Z209 Contact with and (suspected) exposure to unspecified communicable disease: Secondary | ICD-10-CM | POA: Diagnosis not present

## 2019-05-07 DIAGNOSIS — Z79899 Other long term (current) drug therapy: Secondary | ICD-10-CM | POA: Diagnosis not present

## 2019-05-07 DIAGNOSIS — Z743 Need for continuous supervision: Secondary | ICD-10-CM | POA: Diagnosis not present

## 2019-05-07 DIAGNOSIS — S299XXA Unspecified injury of thorax, initial encounter: Secondary | ICD-10-CM | POA: Diagnosis not present

## 2019-05-07 DIAGNOSIS — R52 Pain, unspecified: Secondary | ICD-10-CM | POA: Diagnosis not present

## 2019-05-07 DIAGNOSIS — I959 Hypotension, unspecified: Secondary | ICD-10-CM | POA: Diagnosis not present

## 2019-05-07 DIAGNOSIS — M25552 Pain in left hip: Secondary | ICD-10-CM | POA: Diagnosis not present

## 2019-05-07 DIAGNOSIS — M25551 Pain in right hip: Secondary | ICD-10-CM | POA: Diagnosis not present

## 2019-05-07 DIAGNOSIS — S3992XA Unspecified injury of lower back, initial encounter: Secondary | ICD-10-CM | POA: Diagnosis not present

## 2019-05-07 DIAGNOSIS — I10 Essential (primary) hypertension: Secondary | ICD-10-CM | POA: Diagnosis not present

## 2019-05-07 DIAGNOSIS — G8929 Other chronic pain: Secondary | ICD-10-CM | POA: Diagnosis not present

## 2019-05-07 DIAGNOSIS — S79912A Unspecified injury of left hip, initial encounter: Secondary | ICD-10-CM | POA: Diagnosis not present

## 2019-05-07 DIAGNOSIS — Z91040 Latex allergy status: Secondary | ICD-10-CM | POA: Diagnosis not present

## 2019-05-07 DIAGNOSIS — S79911A Unspecified injury of right hip, initial encounter: Secondary | ICD-10-CM | POA: Diagnosis not present

## 2019-05-07 DIAGNOSIS — W19XXXA Unspecified fall, initial encounter: Secondary | ICD-10-CM | POA: Diagnosis not present

## 2019-05-07 DIAGNOSIS — R0902 Hypoxemia: Secondary | ICD-10-CM | POA: Diagnosis not present

## 2019-05-10 DIAGNOSIS — Z749 Problem related to care provider dependency, unspecified: Secondary | ICD-10-CM | POA: Diagnosis not present

## 2019-05-10 DIAGNOSIS — S12101A Unspecified nondisplaced fracture of second cervical vertebra, initial encounter for closed fracture: Secondary | ICD-10-CM | POA: Diagnosis not present

## 2019-05-10 DIAGNOSIS — G8222 Paraplegia, incomplete: Secondary | ICD-10-CM | POA: Diagnosis not present

## 2019-05-10 DIAGNOSIS — L89154 Pressure ulcer of sacral region, stage 4: Secondary | ICD-10-CM | POA: Diagnosis not present

## 2019-05-17 DIAGNOSIS — N319 Neuromuscular dysfunction of bladder, unspecified: Secondary | ICD-10-CM | POA: Diagnosis not present

## 2019-05-17 DIAGNOSIS — R29898 Other symptoms and signs involving the musculoskeletal system: Secondary | ICD-10-CM | POA: Diagnosis not present

## 2019-05-17 DIAGNOSIS — F1120 Opioid dependence, uncomplicated: Secondary | ICD-10-CM | POA: Diagnosis not present

## 2019-05-17 DIAGNOSIS — R52 Pain, unspecified: Secondary | ICD-10-CM | POA: Diagnosis not present

## 2019-05-17 DIAGNOSIS — I959 Hypotension, unspecified: Secondary | ICD-10-CM | POA: Diagnosis not present

## 2019-05-17 DIAGNOSIS — Z743 Need for continuous supervision: Secondary | ICD-10-CM | POA: Diagnosis not present

## 2019-05-17 DIAGNOSIS — Z7401 Bed confinement status: Secondary | ICD-10-CM | POA: Diagnosis not present

## 2019-05-17 DIAGNOSIS — R269 Unspecified abnormalities of gait and mobility: Secondary | ICD-10-CM | POA: Diagnosis not present

## 2019-05-17 DIAGNOSIS — Z79899 Other long term (current) drug therapy: Secondary | ICD-10-CM | POA: Diagnosis not present

## 2019-05-17 DIAGNOSIS — R000 Tachycardia, unspecified: Secondary | ICD-10-CM | POA: Diagnosis not present

## 2019-05-17 DIAGNOSIS — L89309 Pressure ulcer of unspecified buttock, unspecified stage: Secondary | ICD-10-CM | POA: Diagnosis not present

## 2019-05-31 DIAGNOSIS — G8222 Paraplegia, incomplete: Secondary | ICD-10-CM | POA: Diagnosis not present

## 2019-05-31 IMAGING — DX DG CHEST 1V PORT
1 series · 1 of 1 positions shown · non-contrast
Comparison: 11/30/2017 at 3411 hours

CLINICAL DATA: Hypoxia

EXAM:
PORTABLE CHEST 1 VIEW

[chest ap]
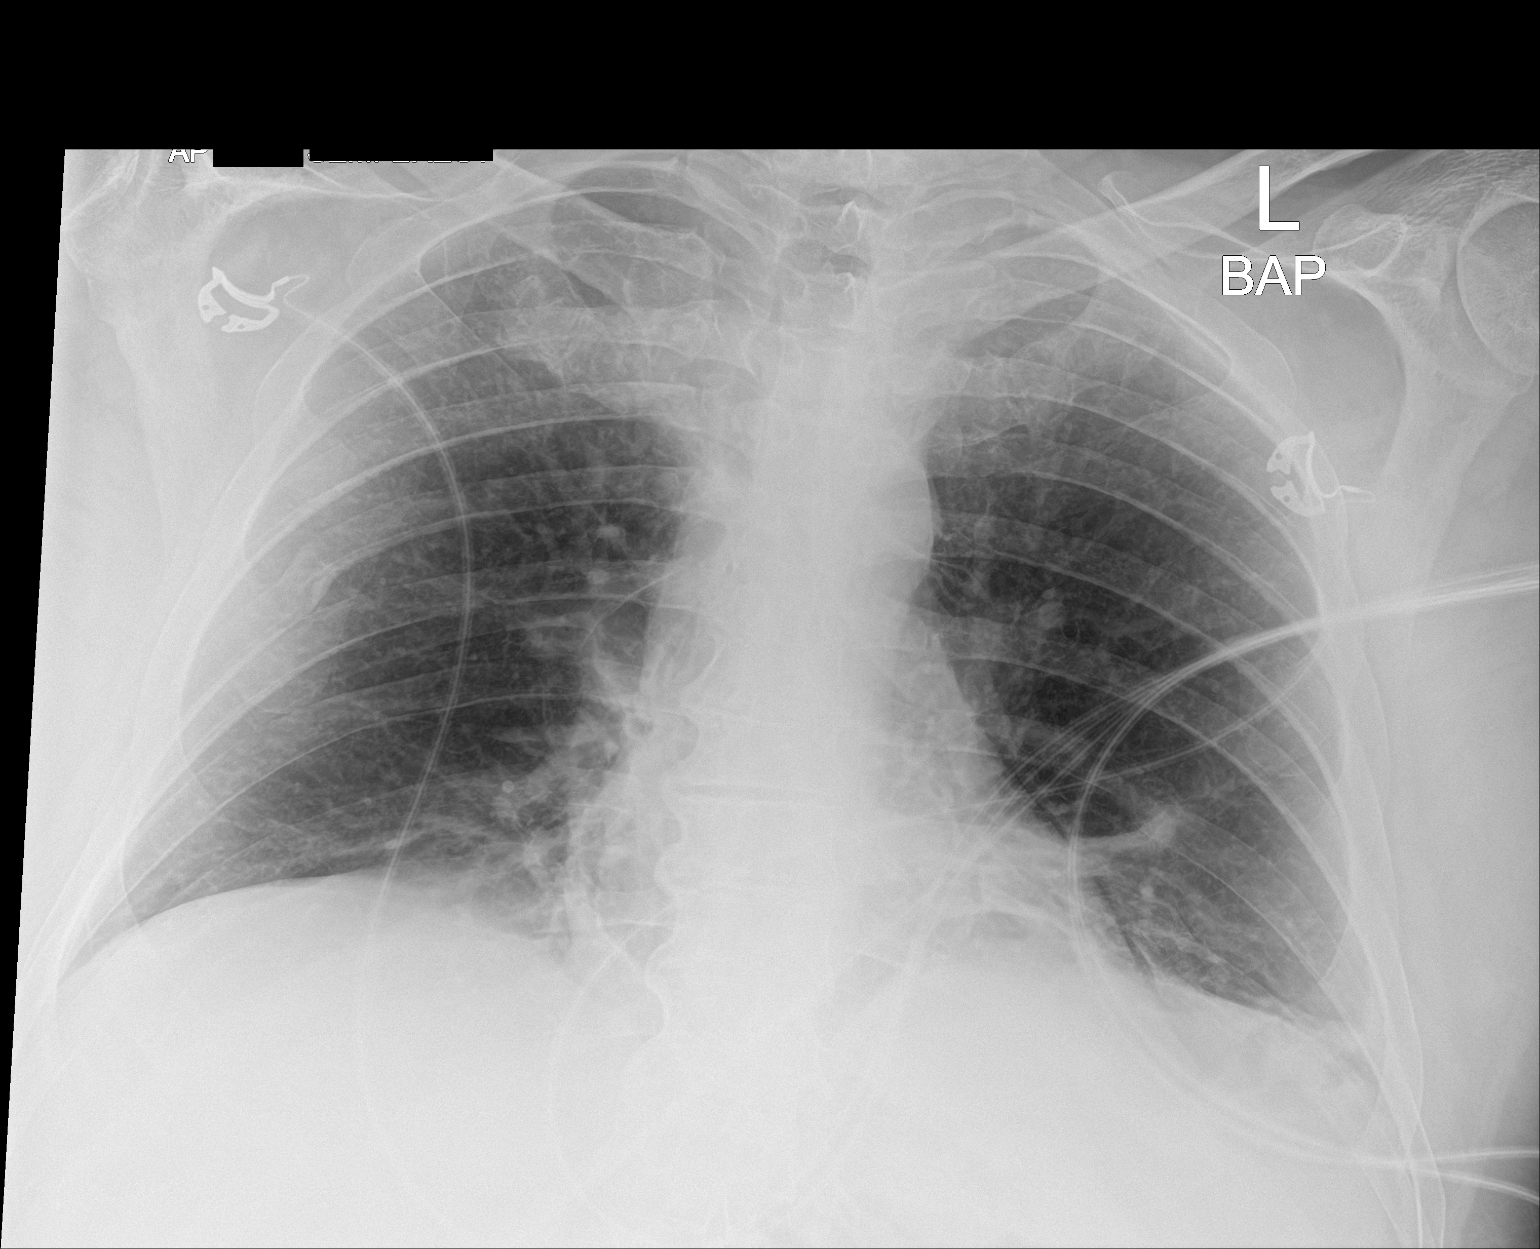

[1 of 1 positions shown; findings below may reference images not displayed]

FINDINGS: 5654 hours. Midline trachea. Normal heart size. No pleural effusion
or pneumothorax. Remote right sixth posterolateral rib fracture.
Slight improved inspiratory effort with persistent bilateral
infrahilar pulmonary opacities.
IMPRESSION: Bilateral infrahilar opacities are persistent since the radiograph
of earlier today. New since 03/11/2017. Considerations include
atelectasis or early/mild infection.

## 2019-06-01 IMAGING — DX DG CHEST 1V PORT
1 series · 1 of 1 positions shown · non-contrast
Comparison: Radiograph yesterday.

CLINICAL DATA: Acute respiratory failure.

EXAM:
PORTABLE CHEST 1 VIEW

[chest ap]
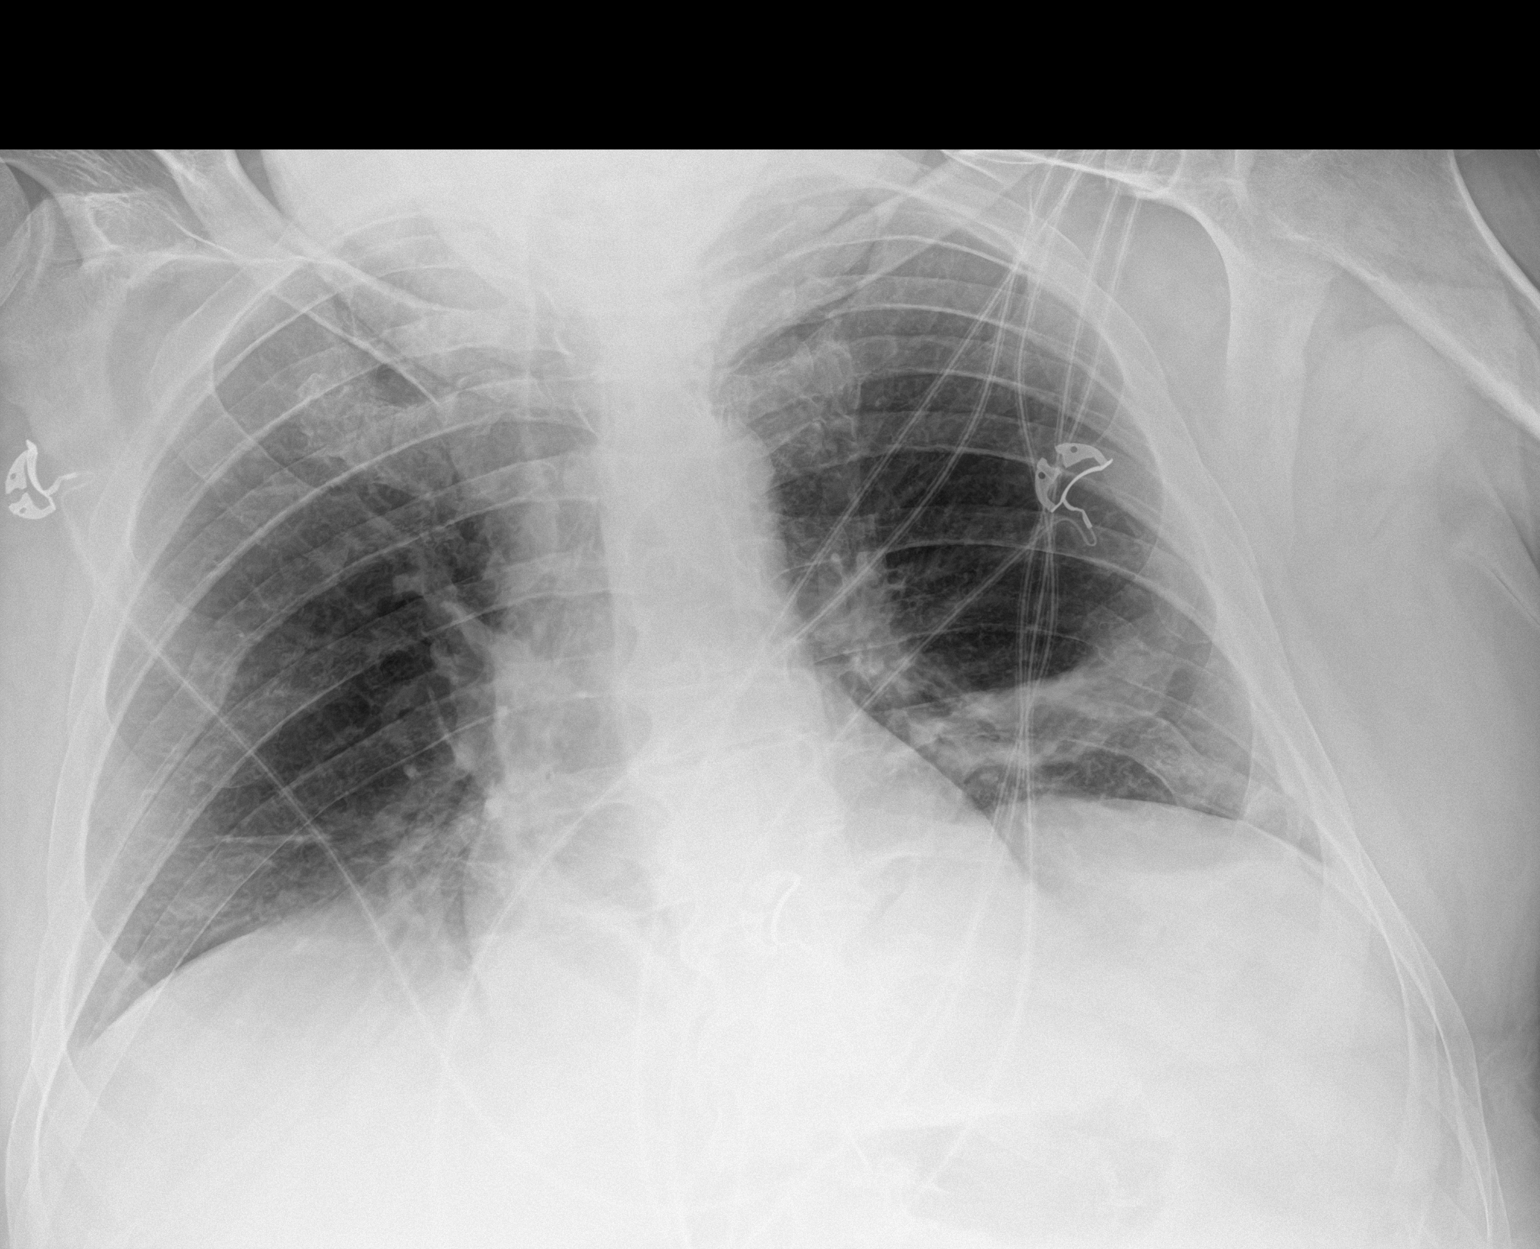

[1 of 1 positions shown; findings below may reference images not displayed]

FINDINGS: Low lung volumes. Worsening left basilar aeration with increasing
opacity. Heart size normal for technique. No pulmonary edema or
large pleural effusion.
IMPRESSION: Persistent bibasilar opacities with interval worsening on the left,
which may be increasing atelectasis or pneumonia. Low lung volumes.

## 2019-06-02 DIAGNOSIS — Z452 Encounter for adjustment and management of vascular access device: Secondary | ICD-10-CM | POA: Diagnosis not present

## 2019-06-02 DIAGNOSIS — G8222 Paraplegia, incomplete: Secondary | ICD-10-CM | POA: Diagnosis not present

## 2019-06-02 DIAGNOSIS — Z5181 Encounter for therapeutic drug level monitoring: Secondary | ICD-10-CM | POA: Diagnosis not present

## 2019-06-02 DIAGNOSIS — Z466 Encounter for fitting and adjustment of urinary device: Secondary | ICD-10-CM | POA: Diagnosis not present

## 2019-06-02 DIAGNOSIS — Z792 Long term (current) use of antibiotics: Secondary | ICD-10-CM | POA: Diagnosis not present

## 2019-06-02 DIAGNOSIS — N390 Urinary tract infection, site not specified: Secondary | ICD-10-CM | POA: Diagnosis not present

## 2019-06-02 DIAGNOSIS — R339 Retention of urine, unspecified: Secondary | ICD-10-CM | POA: Diagnosis not present

## 2019-06-02 DIAGNOSIS — B9562 Methicillin resistant Staphylococcus aureus infection as the cause of diseases classified elsewhere: Secondary | ICD-10-CM | POA: Diagnosis not present

## 2019-06-02 IMAGING — CT CT HEAD CODE STROKE
4 series · 15 of 47 positions shown, 17 images · non-contrast
Comparison: None.

CLINICAL DATA: Code stroke. Initial evaluation for acute stroke,
altered mental status, nonresponsive.

EXAM:
CT HEAD WITHOUT CONTRAST
TECHNIQUE: Contiguous axial images were obtained from the base of the skull
through the vertex without intravenous contrast.

[Series 3: head wo · axial · 0.44mm/px · z∈[-156,-36]mm · 7 of 32 slices shown, 9 images]
[im 4/32  brain]
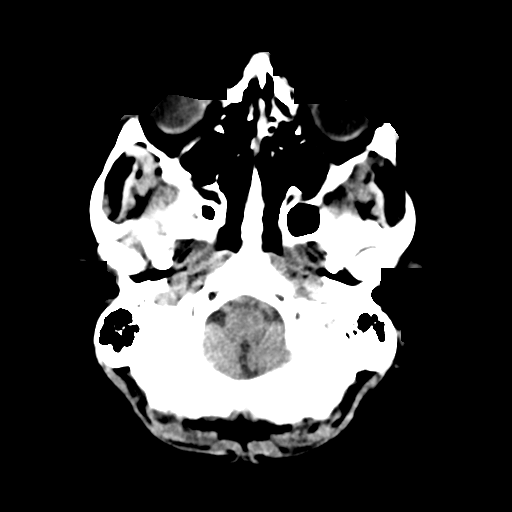
[im 4/32  bone]
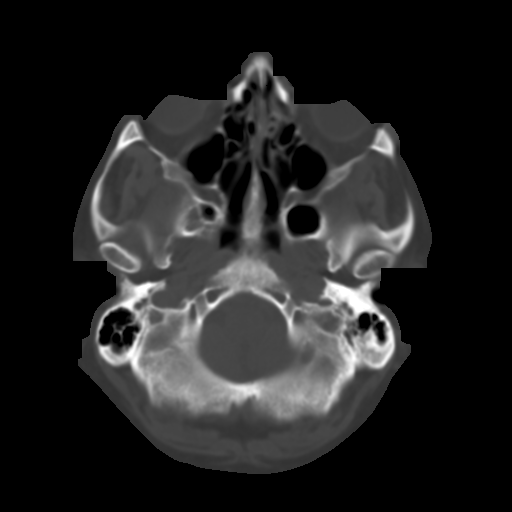
[im 8/32  brain]
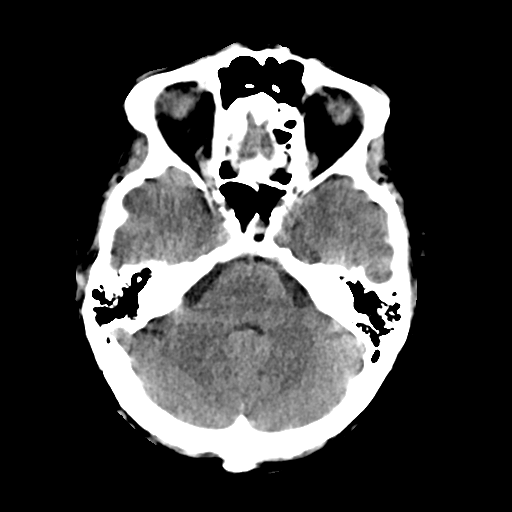
[im 12/32  brain]
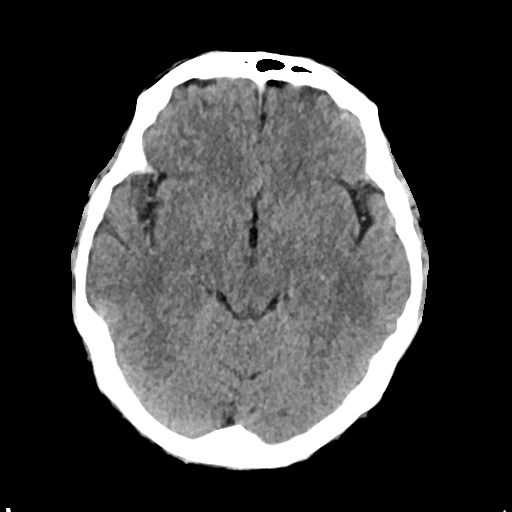
[im 16/32  brain]
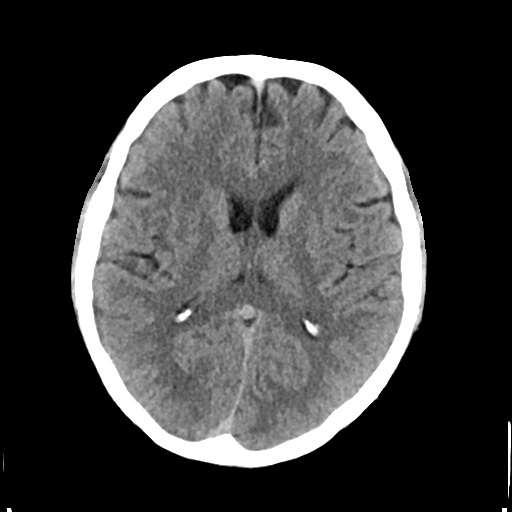
[im 20/32  brain]
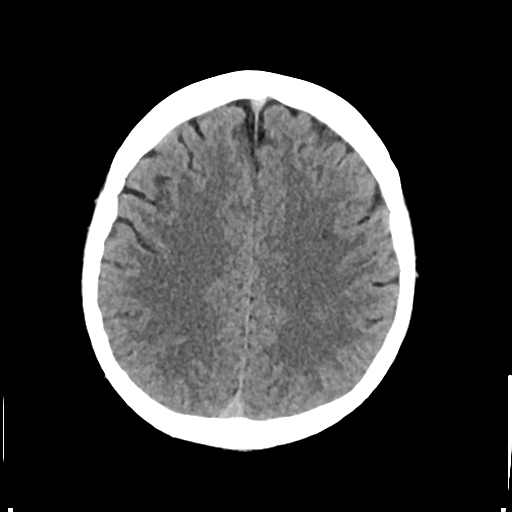
[im 20/32  bone]
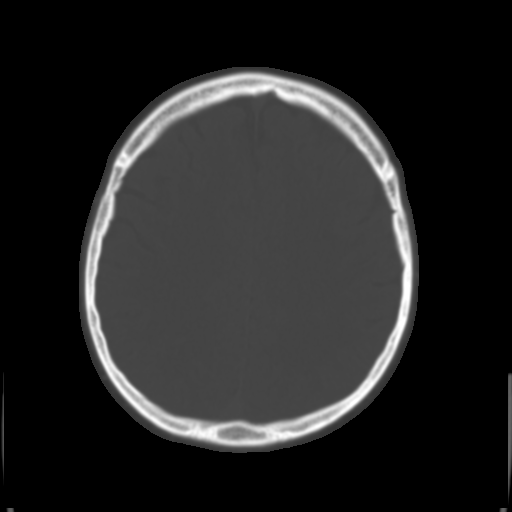
[im 24/32  brain]
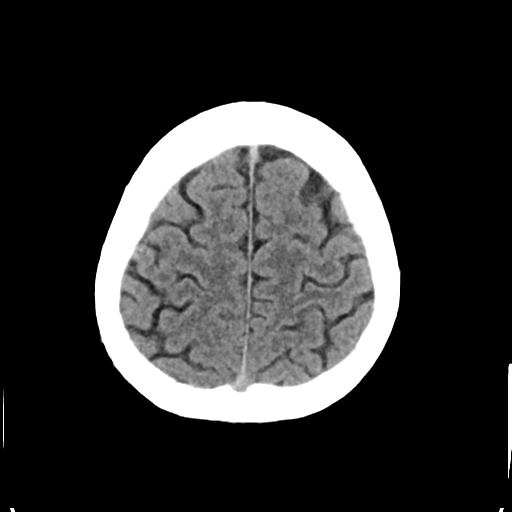
[im 28/32  brain]
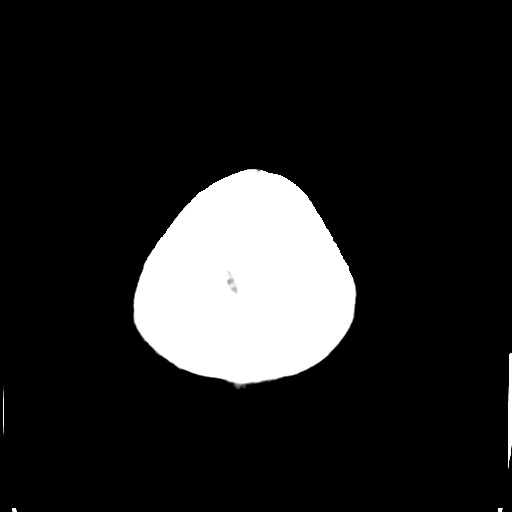

[Series 4: head bone · axial · 0.44mm/px · z∈[-156,-140]mm · 2 of 80 slices shown]
[im 8/80  bone]
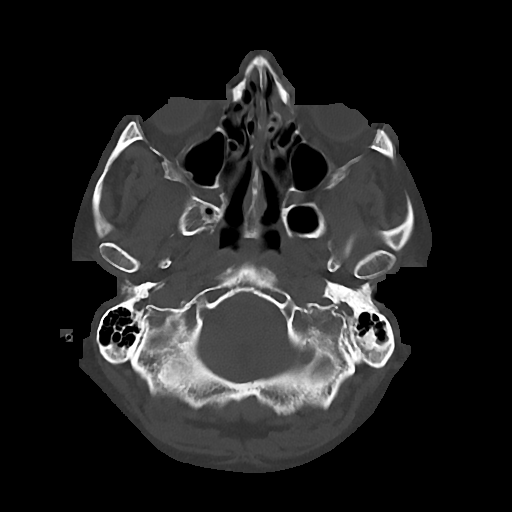
[im 16/80  bone]
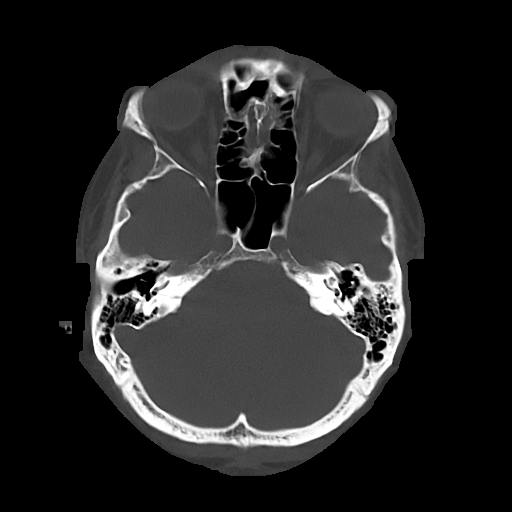

[Series 5: cor soft · coronal · 0.31mm/px · 3 of 74 slices shown]
[im 25/74  brain]
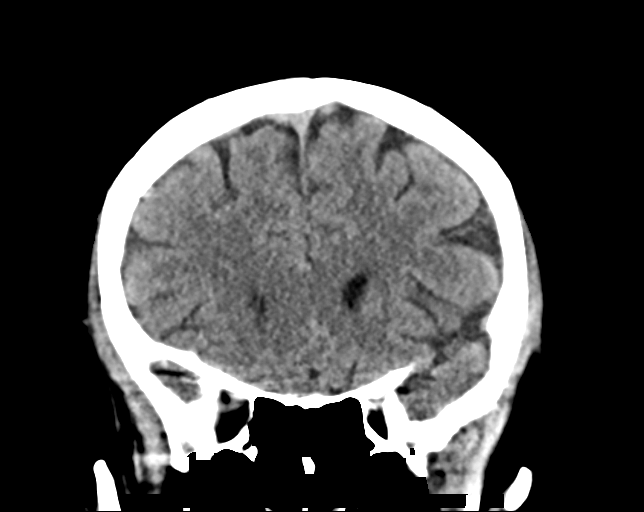
[im 33/74  brain]
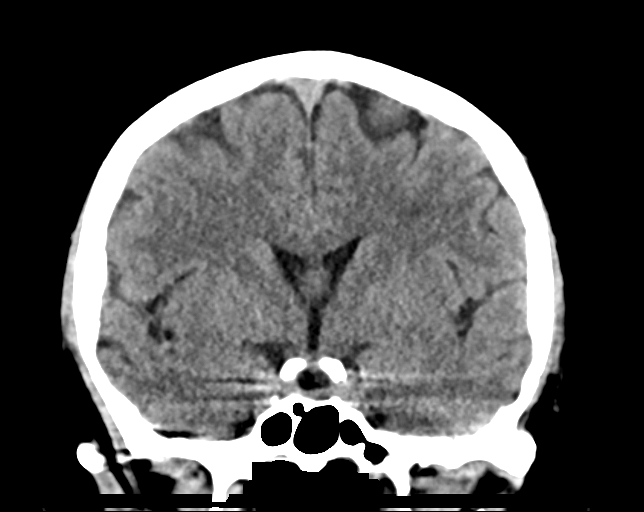
[im 41/74  brain]
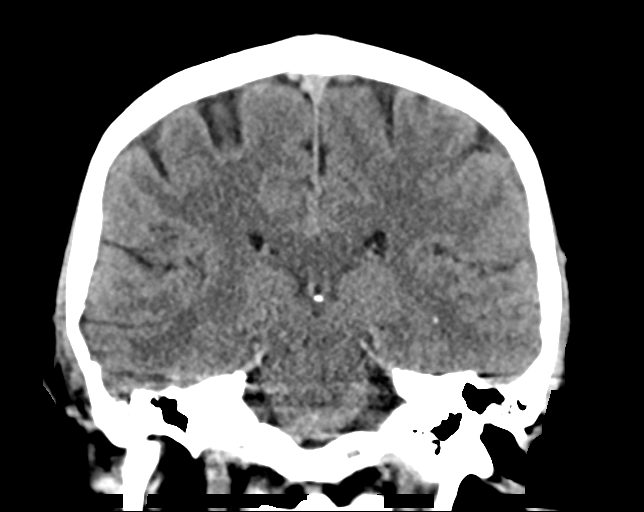

[Series 6: sag soft · sagittal · 0.31mm/px · 3 of 65 slices shown]
[im 22/65  brain]
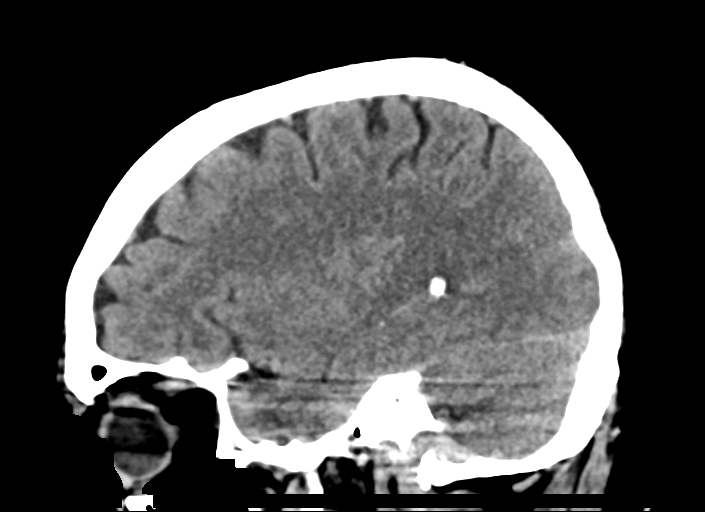
[im 33/65  brain]
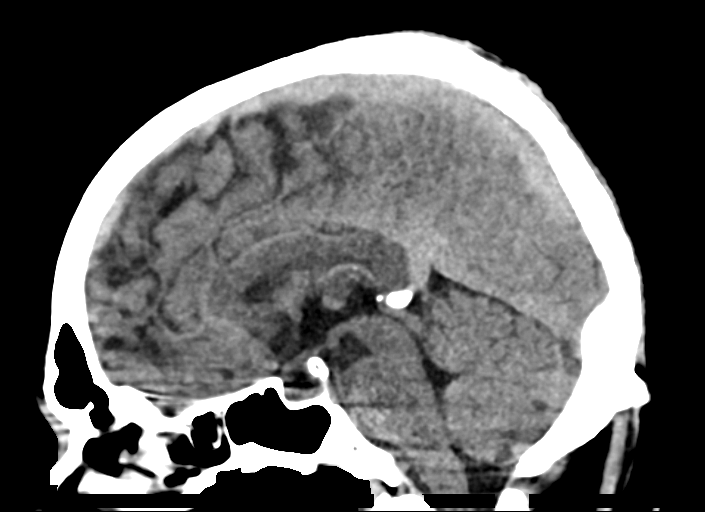
[im 43/65  brain]
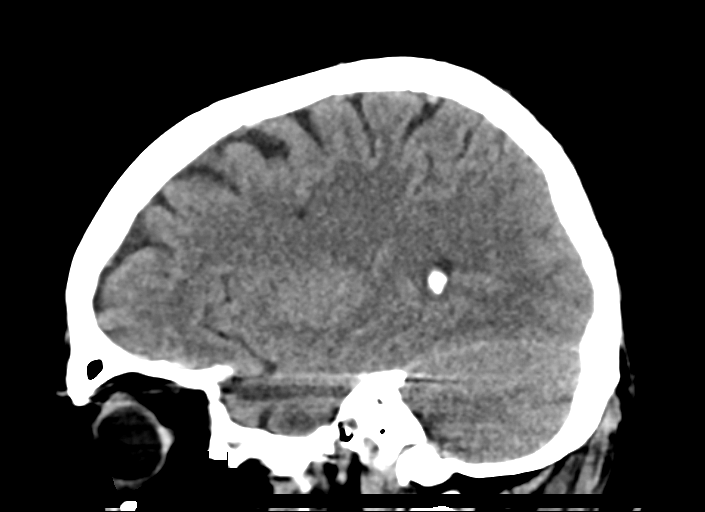

[15 of 47 positions shown; findings below may reference images not displayed]

FINDINGS: Brain: Cerebral volume normal. No acute intracranial hemorrhage. No
acute large vessel territory infarct. No mass lesion, midline shift
or mass effect. No hydrocephalus. No extra-axial fluid collection.

Vascular: No hyperdense vessel.

Skull: Scalp soft tissues and calvarium within normal limits.

Sinuses/Orbits: Globes orbital soft tissues normal. Scattered
mucosal thickening within the ethmoidal air cells. Paranasal sinuses
otherwise clear. Trace left mastoid effusion.

Other: None.

ASPECTS (Alberta Stroke Program Early CT Score)

- Ganglionic level infarction (caudate, lentiform nuclei, internal
capsule, insula, M1-M3 cortex): 7

- Supraganglionic infarction (M4-M6 cortex): 3

Total score (0-10 with 10 being normal): 10
IMPRESSION: 1. No acute intracranial infarct or other process identified.
2. ASPECTS is 10

These results were communicated to [employer] at [DATE] Limage 12/02/2017by
text page via the AMION messaging system.

## 2019-06-05 DIAGNOSIS — G8222 Paraplegia, incomplete: Secondary | ICD-10-CM | POA: Diagnosis not present

## 2019-06-05 DIAGNOSIS — Z435 Encounter for attention to cystostomy: Secondary | ICD-10-CM | POA: Diagnosis not present

## 2019-06-05 DIAGNOSIS — R339 Retention of urine, unspecified: Secondary | ICD-10-CM | POA: Diagnosis not present

## 2019-06-05 DIAGNOSIS — N200 Calculus of kidney: Secondary | ICD-10-CM | POA: Diagnosis not present

## 2019-06-05 DIAGNOSIS — Z79891 Long term (current) use of opiate analgesic: Secondary | ICD-10-CM | POA: Diagnosis not present

## 2019-06-10 DIAGNOSIS — L89154 Pressure ulcer of sacral region, stage 4: Secondary | ICD-10-CM | POA: Diagnosis not present

## 2019-06-10 DIAGNOSIS — Z749 Problem related to care provider dependency, unspecified: Secondary | ICD-10-CM | POA: Diagnosis not present

## 2019-06-10 DIAGNOSIS — S12101A Unspecified nondisplaced fracture of second cervical vertebra, initial encounter for closed fracture: Secondary | ICD-10-CM | POA: Diagnosis not present

## 2019-06-10 DIAGNOSIS — G8222 Paraplegia, incomplete: Secondary | ICD-10-CM | POA: Diagnosis not present

## 2019-06-13 DIAGNOSIS — N1330 Unspecified hydronephrosis: Secondary | ICD-10-CM | POA: Diagnosis not present

## 2019-06-13 DIAGNOSIS — R1030 Lower abdominal pain, unspecified: Secondary | ICD-10-CM | POA: Diagnosis not present

## 2019-06-13 DIAGNOSIS — Z91040 Latex allergy status: Secondary | ICD-10-CM | POA: Diagnosis not present

## 2019-06-13 DIAGNOSIS — R1084 Generalized abdominal pain: Secondary | ICD-10-CM | POA: Diagnosis not present

## 2019-06-13 DIAGNOSIS — K5900 Constipation, unspecified: Secondary | ICD-10-CM | POA: Diagnosis not present

## 2019-06-13 DIAGNOSIS — R5381 Other malaise: Secondary | ICD-10-CM | POA: Diagnosis not present

## 2019-06-13 DIAGNOSIS — N3289 Other specified disorders of bladder: Secondary | ICD-10-CM | POA: Diagnosis not present

## 2019-06-13 DIAGNOSIS — N319 Neuromuscular dysfunction of bladder, unspecified: Secondary | ICD-10-CM | POA: Diagnosis not present

## 2019-06-13 DIAGNOSIS — A499 Bacterial infection, unspecified: Secondary | ICD-10-CM | POA: Diagnosis not present

## 2019-06-13 DIAGNOSIS — T83010A Breakdown (mechanical) of cystostomy catheter, initial encounter: Secondary | ICD-10-CM | POA: Diagnosis not present

## 2019-06-13 DIAGNOSIS — Z79899 Other long term (current) drug therapy: Secondary | ICD-10-CM | POA: Diagnosis not present

## 2019-06-13 DIAGNOSIS — T83098A Other mechanical complication of other indwelling urethral catheter, initial encounter: Secondary | ICD-10-CM | POA: Diagnosis not present

## 2019-06-13 DIAGNOSIS — M6281 Muscle weakness (generalized): Secondary | ICD-10-CM | POA: Diagnosis not present

## 2019-06-13 DIAGNOSIS — F329 Major depressive disorder, single episode, unspecified: Secondary | ICD-10-CM | POA: Diagnosis not present

## 2019-06-13 DIAGNOSIS — F17210 Nicotine dependence, cigarettes, uncomplicated: Secondary | ICD-10-CM | POA: Diagnosis not present

## 2019-06-13 DIAGNOSIS — R279 Unspecified lack of coordination: Secondary | ICD-10-CM | POA: Diagnosis not present

## 2019-06-13 DIAGNOSIS — R52 Pain, unspecified: Secondary | ICD-10-CM | POA: Diagnosis not present

## 2019-06-15 DIAGNOSIS — Z79899 Other long term (current) drug therapy: Secondary | ICD-10-CM | POA: Diagnosis not present

## 2019-06-15 DIAGNOSIS — R52 Pain, unspecified: Secondary | ICD-10-CM | POA: Diagnosis not present

## 2019-06-15 DIAGNOSIS — L89309 Pressure ulcer of unspecified buttock, unspecified stage: Secondary | ICD-10-CM | POA: Diagnosis not present

## 2019-06-15 DIAGNOSIS — S24139D Anterior cord syndrome at unspecified level of thoracic spinal cord, subsequent encounter: Secondary | ICD-10-CM | POA: Diagnosis not present

## 2019-06-15 DIAGNOSIS — Z743 Need for continuous supervision: Secondary | ICD-10-CM | POA: Diagnosis not present

## 2019-06-15 DIAGNOSIS — R69 Illness, unspecified: Secondary | ICD-10-CM | POA: Diagnosis not present

## 2019-06-15 DIAGNOSIS — R5381 Other malaise: Secondary | ICD-10-CM | POA: Diagnosis not present

## 2019-06-15 DIAGNOSIS — G8918 Other acute postprocedural pain: Secondary | ICD-10-CM | POA: Diagnosis not present

## 2019-06-15 DIAGNOSIS — R269 Unspecified abnormalities of gait and mobility: Secondary | ICD-10-CM | POA: Diagnosis not present

## 2019-06-21 DIAGNOSIS — F411 Generalized anxiety disorder: Secondary | ICD-10-CM | POA: Diagnosis not present

## 2019-06-21 DIAGNOSIS — I959 Hypotension, unspecified: Secondary | ICD-10-CM | POA: Diagnosis not present

## 2019-06-21 DIAGNOSIS — Z209 Contact with and (suspected) exposure to unspecified communicable disease: Secondary | ICD-10-CM | POA: Diagnosis not present

## 2019-06-21 DIAGNOSIS — Z20828 Contact with and (suspected) exposure to other viral communicable diseases: Secondary | ICD-10-CM | POA: Diagnosis not present

## 2019-06-21 DIAGNOSIS — K210 Gastro-esophageal reflux disease with esophagitis: Secondary | ICD-10-CM | POA: Diagnosis not present

## 2019-06-21 DIAGNOSIS — L89813 Pressure ulcer of head, stage 3: Secondary | ICD-10-CM | POA: Diagnosis not present

## 2019-06-21 DIAGNOSIS — M545 Low back pain: Secondary | ICD-10-CM | POA: Diagnosis not present

## 2019-06-21 DIAGNOSIS — F320 Major depressive disorder, single episode, mild: Secondary | ICD-10-CM | POA: Diagnosis not present

## 2019-06-21 DIAGNOSIS — F900 Attention-deficit hyperactivity disorder, predominantly inattentive type: Secondary | ICD-10-CM | POA: Diagnosis not present

## 2019-06-21 DIAGNOSIS — G8222 Paraplegia, incomplete: Secondary | ICD-10-CM | POA: Diagnosis not present

## 2019-06-21 DIAGNOSIS — Z0000 Encounter for general adult medical examination without abnormal findings: Secondary | ICD-10-CM | POA: Diagnosis not present

## 2019-06-21 DIAGNOSIS — Z7401 Bed confinement status: Secondary | ICD-10-CM | POA: Diagnosis not present

## 2019-06-21 DIAGNOSIS — Z01812 Encounter for preprocedural laboratory examination: Secondary | ICD-10-CM | POA: Diagnosis not present

## 2019-06-23 DIAGNOSIS — N319 Neuromuscular dysfunction of bladder, unspecified: Secondary | ICD-10-CM | POA: Diagnosis not present

## 2019-06-23 DIAGNOSIS — R5381 Other malaise: Secondary | ICD-10-CM | POA: Diagnosis not present

## 2019-06-23 DIAGNOSIS — Z435 Encounter for attention to cystostomy: Secondary | ICD-10-CM | POA: Diagnosis not present

## 2019-06-23 DIAGNOSIS — I10 Essential (primary) hypertension: Secondary | ICD-10-CM | POA: Diagnosis not present

## 2019-06-23 DIAGNOSIS — F17210 Nicotine dependence, cigarettes, uncomplicated: Secondary | ICD-10-CM | POA: Diagnosis not present

## 2019-06-23 DIAGNOSIS — T8385XA Stenosis of genitourinary prosthetic devices, implants and grafts, initial encounter: Secondary | ICD-10-CM | POA: Diagnosis not present

## 2019-06-23 DIAGNOSIS — N3289 Other specified disorders of bladder: Secondary | ICD-10-CM | POA: Diagnosis not present

## 2019-06-23 DIAGNOSIS — F329 Major depressive disorder, single episode, unspecified: Secondary | ICD-10-CM | POA: Diagnosis not present

## 2019-06-23 DIAGNOSIS — N318 Other neuromuscular dysfunction of bladder: Secondary | ICD-10-CM | POA: Diagnosis not present

## 2019-06-24 DIAGNOSIS — Z435 Encounter for attention to cystostomy: Secondary | ICD-10-CM | POA: Diagnosis not present

## 2019-06-24 DIAGNOSIS — A499 Bacterial infection, unspecified: Secondary | ICD-10-CM | POA: Diagnosis not present

## 2019-06-24 DIAGNOSIS — R5381 Other malaise: Secondary | ICD-10-CM | POA: Diagnosis not present

## 2019-06-24 DIAGNOSIS — F329 Major depressive disorder, single episode, unspecified: Secondary | ICD-10-CM | POA: Diagnosis not present

## 2019-06-24 DIAGNOSIS — M6281 Muscle weakness (generalized): Secondary | ICD-10-CM | POA: Diagnosis not present

## 2019-06-24 DIAGNOSIS — F17210 Nicotine dependence, cigarettes, uncomplicated: Secondary | ICD-10-CM | POA: Diagnosis not present

## 2019-06-24 DIAGNOSIS — N3289 Other specified disorders of bladder: Secondary | ICD-10-CM | POA: Diagnosis not present

## 2019-06-24 DIAGNOSIS — R279 Unspecified lack of coordination: Secondary | ICD-10-CM | POA: Diagnosis not present

## 2019-06-27 DIAGNOSIS — N200 Calculus of kidney: Secondary | ICD-10-CM | POA: Diagnosis not present

## 2019-06-27 DIAGNOSIS — N319 Neuromuscular dysfunction of bladder, unspecified: Secondary | ICD-10-CM | POA: Diagnosis not present

## 2019-06-28 DIAGNOSIS — G8222 Paraplegia, incomplete: Secondary | ICD-10-CM | POA: Diagnosis not present

## 2019-07-03 DIAGNOSIS — N200 Calculus of kidney: Secondary | ICD-10-CM | POA: Diagnosis not present

## 2019-07-03 DIAGNOSIS — Z435 Encounter for attention to cystostomy: Secondary | ICD-10-CM | POA: Diagnosis not present

## 2019-07-03 DIAGNOSIS — G8222 Paraplegia, incomplete: Secondary | ICD-10-CM | POA: Diagnosis not present

## 2019-07-03 DIAGNOSIS — R339 Retention of urine, unspecified: Secondary | ICD-10-CM | POA: Diagnosis not present

## 2019-07-03 DIAGNOSIS — Z79891 Long term (current) use of opiate analgesic: Secondary | ICD-10-CM | POA: Diagnosis not present

## 2019-07-04 DIAGNOSIS — Z749 Problem related to care provider dependency, unspecified: Secondary | ICD-10-CM | POA: Diagnosis not present

## 2019-07-04 DIAGNOSIS — G8222 Paraplegia, incomplete: Secondary | ICD-10-CM | POA: Diagnosis not present

## 2019-07-04 DIAGNOSIS — S12101A Unspecified nondisplaced fracture of second cervical vertebra, initial encounter for closed fracture: Secondary | ICD-10-CM | POA: Diagnosis not present

## 2019-07-04 DIAGNOSIS — L89154 Pressure ulcer of sacral region, stage 4: Secondary | ICD-10-CM | POA: Diagnosis not present

## 2019-07-05 DIAGNOSIS — L89154 Pressure ulcer of sacral region, stage 4: Secondary | ICD-10-CM | POA: Diagnosis not present

## 2019-07-05 DIAGNOSIS — G8222 Paraplegia, incomplete: Secondary | ICD-10-CM | POA: Diagnosis not present

## 2019-07-05 DIAGNOSIS — S12101A Unspecified nondisplaced fracture of second cervical vertebra, initial encounter for closed fracture: Secondary | ICD-10-CM | POA: Diagnosis not present

## 2019-07-05 DIAGNOSIS — Z749 Problem related to care provider dependency, unspecified: Secondary | ICD-10-CM | POA: Diagnosis not present

## 2019-07-24 DIAGNOSIS — N200 Calculus of kidney: Secondary | ICD-10-CM | POA: Diagnosis not present

## 2019-07-24 DIAGNOSIS — G8222 Paraplegia, incomplete: Secondary | ICD-10-CM | POA: Diagnosis not present

## 2019-07-24 DIAGNOSIS — Z79891 Long term (current) use of opiate analgesic: Secondary | ICD-10-CM | POA: Diagnosis not present

## 2019-07-24 DIAGNOSIS — Z435 Encounter for attention to cystostomy: Secondary | ICD-10-CM | POA: Diagnosis not present

## 2019-07-24 DIAGNOSIS — R339 Retention of urine, unspecified: Secondary | ICD-10-CM | POA: Diagnosis not present

## 2019-07-28 DIAGNOSIS — G8222 Paraplegia, incomplete: Secondary | ICD-10-CM | POA: Diagnosis not present

## 2019-07-31 DIAGNOSIS — G8222 Paraplegia, incomplete: Secondary | ICD-10-CM | POA: Diagnosis not present

## 2019-07-31 DIAGNOSIS — S12101A Unspecified nondisplaced fracture of second cervical vertebra, initial encounter for closed fracture: Secondary | ICD-10-CM | POA: Diagnosis not present

## 2019-07-31 DIAGNOSIS — Z749 Problem related to care provider dependency, unspecified: Secondary | ICD-10-CM | POA: Diagnosis not present

## 2019-07-31 DIAGNOSIS — L89154 Pressure ulcer of sacral region, stage 4: Secondary | ICD-10-CM | POA: Diagnosis not present

## 2019-08-03 DIAGNOSIS — R339 Retention of urine, unspecified: Secondary | ICD-10-CM | POA: Diagnosis not present

## 2019-08-03 DIAGNOSIS — N200 Calculus of kidney: Secondary | ICD-10-CM | POA: Diagnosis not present

## 2019-08-03 DIAGNOSIS — G8222 Paraplegia, incomplete: Secondary | ICD-10-CM | POA: Diagnosis not present

## 2019-08-03 DIAGNOSIS — Z435 Encounter for attention to cystostomy: Secondary | ICD-10-CM | POA: Diagnosis not present

## 2019-08-03 DIAGNOSIS — Z79891 Long term (current) use of opiate analgesic: Secondary | ICD-10-CM | POA: Diagnosis not present

## 2019-08-04 DIAGNOSIS — F329 Major depressive disorder, single episode, unspecified: Secondary | ICD-10-CM | POA: Diagnosis not present

## 2019-08-04 DIAGNOSIS — G8220 Paraplegia, unspecified: Secondary | ICD-10-CM | POA: Diagnosis not present

## 2019-08-04 DIAGNOSIS — N319 Neuromuscular dysfunction of bladder, unspecified: Secondary | ICD-10-CM | POA: Diagnosis not present

## 2019-08-04 DIAGNOSIS — L8995 Pressure ulcer of unspecified site, unstageable: Secondary | ICD-10-CM | POA: Diagnosis not present

## 2019-08-04 DIAGNOSIS — Z435 Encounter for attention to cystostomy: Secondary | ICD-10-CM | POA: Diagnosis not present

## 2019-08-09 DIAGNOSIS — M5134 Other intervertebral disc degeneration, thoracic region: Secondary | ICD-10-CM | POA: Diagnosis not present

## 2019-08-09 DIAGNOSIS — S24139D Anterior cord syndrome at unspecified level of thoracic spinal cord, subsequent encounter: Secondary | ICD-10-CM | POA: Diagnosis not present

## 2019-08-09 DIAGNOSIS — Z7189 Other specified counseling: Secondary | ICD-10-CM | POA: Diagnosis not present

## 2019-08-09 DIAGNOSIS — M5136 Other intervertebral disc degeneration, lumbar region: Secondary | ICD-10-CM | POA: Diagnosis not present

## 2019-08-17 DIAGNOSIS — K279 Peptic ulcer, site unspecified, unspecified as acute or chronic, without hemorrhage or perforation: Secondary | ICD-10-CM | POA: Diagnosis not present

## 2019-08-17 DIAGNOSIS — Z435 Encounter for attention to cystostomy: Secondary | ICD-10-CM | POA: Diagnosis not present

## 2019-08-17 DIAGNOSIS — G8220 Paraplegia, unspecified: Secondary | ICD-10-CM | POA: Diagnosis not present

## 2019-08-17 DIAGNOSIS — N319 Neuromuscular dysfunction of bladder, unspecified: Secondary | ICD-10-CM | POA: Diagnosis not present

## 2019-08-17 DIAGNOSIS — F329 Major depressive disorder, single episode, unspecified: Secondary | ICD-10-CM | POA: Diagnosis not present

## 2019-08-17 DIAGNOSIS — G629 Polyneuropathy, unspecified: Secondary | ICD-10-CM | POA: Diagnosis not present

## 2019-08-17 DIAGNOSIS — Z79891 Long term (current) use of opiate analgesic: Secondary | ICD-10-CM | POA: Diagnosis not present

## 2019-08-17 DIAGNOSIS — F17210 Nicotine dependence, cigarettes, uncomplicated: Secondary | ICD-10-CM | POA: Diagnosis not present

## 2019-08-17 DIAGNOSIS — L8995 Pressure ulcer of unspecified site, unstageable: Secondary | ICD-10-CM | POA: Diagnosis not present

## 2019-08-23 DIAGNOSIS — Z79891 Long term (current) use of opiate analgesic: Secondary | ICD-10-CM | POA: Diagnosis not present

## 2019-08-23 DIAGNOSIS — K279 Peptic ulcer, site unspecified, unspecified as acute or chronic, without hemorrhage or perforation: Secondary | ICD-10-CM | POA: Diagnosis not present

## 2019-08-23 DIAGNOSIS — G629 Polyneuropathy, unspecified: Secondary | ICD-10-CM | POA: Diagnosis not present

## 2019-08-23 DIAGNOSIS — F17210 Nicotine dependence, cigarettes, uncomplicated: Secondary | ICD-10-CM | POA: Diagnosis not present

## 2019-08-23 DIAGNOSIS — G8220 Paraplegia, unspecified: Secondary | ICD-10-CM | POA: Diagnosis not present

## 2019-08-23 DIAGNOSIS — N319 Neuromuscular dysfunction of bladder, unspecified: Secondary | ICD-10-CM | POA: Diagnosis not present

## 2019-08-23 DIAGNOSIS — F329 Major depressive disorder, single episode, unspecified: Secondary | ICD-10-CM | POA: Diagnosis not present

## 2019-08-23 DIAGNOSIS — L8995 Pressure ulcer of unspecified site, unstageable: Secondary | ICD-10-CM | POA: Diagnosis not present

## 2019-08-23 DIAGNOSIS — Z435 Encounter for attention to cystostomy: Secondary | ICD-10-CM | POA: Diagnosis not present

## 2019-08-28 DIAGNOSIS — G8222 Paraplegia, incomplete: Secondary | ICD-10-CM | POA: Diagnosis not present

## 2019-09-04 DIAGNOSIS — F17210 Nicotine dependence, cigarettes, uncomplicated: Secondary | ICD-10-CM | POA: Diagnosis not present

## 2019-09-04 DIAGNOSIS — L8995 Pressure ulcer of unspecified site, unstageable: Secondary | ICD-10-CM | POA: Diagnosis not present

## 2019-09-04 DIAGNOSIS — N319 Neuromuscular dysfunction of bladder, unspecified: Secondary | ICD-10-CM | POA: Diagnosis not present

## 2019-09-04 DIAGNOSIS — G629 Polyneuropathy, unspecified: Secondary | ICD-10-CM | POA: Diagnosis not present

## 2019-09-04 DIAGNOSIS — K279 Peptic ulcer, site unspecified, unspecified as acute or chronic, without hemorrhage or perforation: Secondary | ICD-10-CM | POA: Diagnosis not present

## 2019-09-04 DIAGNOSIS — G8220 Paraplegia, unspecified: Secondary | ICD-10-CM | POA: Diagnosis not present

## 2019-09-04 DIAGNOSIS — Z79891 Long term (current) use of opiate analgesic: Secondary | ICD-10-CM | POA: Diagnosis not present

## 2019-09-04 DIAGNOSIS — Z435 Encounter for attention to cystostomy: Secondary | ICD-10-CM | POA: Diagnosis not present

## 2019-09-04 DIAGNOSIS — F329 Major depressive disorder, single episode, unspecified: Secondary | ICD-10-CM | POA: Diagnosis not present

## 2019-09-05 DIAGNOSIS — M5136 Other intervertebral disc degeneration, lumbar region: Secondary | ICD-10-CM | POA: Diagnosis not present

## 2019-09-05 DIAGNOSIS — Z7401 Bed confinement status: Secondary | ICD-10-CM | POA: Diagnosis not present

## 2019-09-05 DIAGNOSIS — M5134 Other intervertebral disc degeneration, thoracic region: Secondary | ICD-10-CM | POA: Diagnosis not present

## 2019-09-05 DIAGNOSIS — S24139D Anterior cord syndrome at unspecified level of thoracic spinal cord, subsequent encounter: Secondary | ICD-10-CM | POA: Diagnosis not present

## 2019-09-05 DIAGNOSIS — R0689 Other abnormalities of breathing: Secondary | ICD-10-CM | POA: Diagnosis not present

## 2019-09-05 DIAGNOSIS — R52 Pain, unspecified: Secondary | ICD-10-CM | POA: Diagnosis not present

## 2019-09-14 ENCOUNTER — Emergency Department (HOSPITAL_COMMUNITY)
Admission: EM | Admit: 2019-09-14 | Discharge: 2019-09-15 | Disposition: A | Discharge status: Home / Self Care | Payer: Medicare HMO | Attending: Emergency Medicine | Admitting: Emergency Medicine

## 2019-09-14 ENCOUNTER — Other Ambulatory Visit: Payer: Self-pay

## 2019-09-14 ENCOUNTER — Encounter (HOSPITAL_COMMUNITY): Payer: Self-pay

## 2019-09-14 DIAGNOSIS — K279 Peptic ulcer, site unspecified, unspecified as acute or chronic, without hemorrhage or perforation: Secondary | ICD-10-CM | POA: Diagnosis not present

## 2019-09-14 DIAGNOSIS — Z435 Encounter for attention to cystostomy: Secondary | ICD-10-CM | POA: Diagnosis not present

## 2019-09-14 DIAGNOSIS — G8220 Paraplegia, unspecified: Secondary | ICD-10-CM | POA: Diagnosis not present

## 2019-09-14 DIAGNOSIS — F17210 Nicotine dependence, cigarettes, uncomplicated: Secondary | ICD-10-CM | POA: Insufficient documentation

## 2019-09-14 DIAGNOSIS — G629 Polyneuropathy, unspecified: Secondary | ICD-10-CM | POA: Diagnosis not present

## 2019-09-14 DIAGNOSIS — T839XXA Unspecified complication of genitourinary prosthetic device, implant and graft, initial encounter: Secondary | ICD-10-CM

## 2019-09-14 DIAGNOSIS — T83098A Other mechanical complication of other indwelling urethral catheter, initial encounter: Secondary | ICD-10-CM | POA: Insufficient documentation

## 2019-09-14 DIAGNOSIS — N319 Neuromuscular dysfunction of bladder, unspecified: Secondary | ICD-10-CM | POA: Diagnosis not present

## 2019-09-14 DIAGNOSIS — R69 Illness, unspecified: Secondary | ICD-10-CM | POA: Diagnosis not present

## 2019-09-14 DIAGNOSIS — Z91040 Latex allergy status: Secondary | ICD-10-CM | POA: Insufficient documentation

## 2019-09-14 DIAGNOSIS — Z79891 Long term (current) use of opiate analgesic: Secondary | ICD-10-CM | POA: Diagnosis not present

## 2019-09-14 DIAGNOSIS — Y69 Unspecified misadventure during surgical and medical care: Secondary | ICD-10-CM | POA: Insufficient documentation

## 2019-09-14 DIAGNOSIS — Z79899 Other long term (current) drug therapy: Secondary | ICD-10-CM | POA: Insufficient documentation

## 2019-09-14 DIAGNOSIS — T83091A Other mechanical complication of indwelling urethral catheter, initial encounter: Secondary | ICD-10-CM | POA: Diagnosis not present

## 2019-09-14 DIAGNOSIS — F329 Major depressive disorder, single episode, unspecified: Secondary | ICD-10-CM | POA: Diagnosis not present

## 2019-09-14 DIAGNOSIS — R5381 Other malaise: Secondary | ICD-10-CM | POA: Diagnosis not present

## 2019-09-14 DIAGNOSIS — L8995 Pressure ulcer of unspecified site, unstageable: Secondary | ICD-10-CM | POA: Diagnosis not present

## 2019-09-14 HISTORY — DX: Other cystostomy status: Z93.59
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
## 2019-09-14 NOTE — Discharge Instructions (Addendum)
Follow-up with your urology team.  Return to ER for worsening or concerning symptoms.

## 2019-09-14 NOTE — ED Triage Notes (Signed)
[facility] nurse called and reported that she had removed pts suprapubic catheter and unable to reinsert so pt sent to ED

## 2019-09-15 DIAGNOSIS — R69 Illness, unspecified: Secondary | ICD-10-CM | POA: Diagnosis not present

## 2019-09-15 DIAGNOSIS — R5381 Other malaise: Secondary | ICD-10-CM | POA: Diagnosis not present

## 2019-09-15 DIAGNOSIS — Z7401 Bed confinement status: Secondary | ICD-10-CM | POA: Diagnosis not present

## 2019-09-21 DIAGNOSIS — L8995 Pressure ulcer of unspecified site, unstageable: Secondary | ICD-10-CM | POA: Diagnosis not present

## 2019-09-21 DIAGNOSIS — Z79891 Long term (current) use of opiate analgesic: Secondary | ICD-10-CM | POA: Diagnosis not present

## 2019-09-21 DIAGNOSIS — K279 Peptic ulcer, site unspecified, unspecified as acute or chronic, without hemorrhage or perforation: Secondary | ICD-10-CM | POA: Diagnosis not present

## 2019-09-21 DIAGNOSIS — N319 Neuromuscular dysfunction of bladder, unspecified: Secondary | ICD-10-CM | POA: Diagnosis not present

## 2019-09-21 DIAGNOSIS — F329 Major depressive disorder, single episode, unspecified: Secondary | ICD-10-CM | POA: Diagnosis not present

## 2019-09-21 DIAGNOSIS — G629 Polyneuropathy, unspecified: Secondary | ICD-10-CM | POA: Diagnosis not present

## 2019-09-21 DIAGNOSIS — Z435 Encounter for attention to cystostomy: Secondary | ICD-10-CM | POA: Diagnosis not present

## 2019-09-21 DIAGNOSIS — F17210 Nicotine dependence, cigarettes, uncomplicated: Secondary | ICD-10-CM | POA: Diagnosis not present

## 2019-09-21 DIAGNOSIS — G8220 Paraplegia, unspecified: Secondary | ICD-10-CM | POA: Diagnosis not present

## 2019-09-27 DIAGNOSIS — G8222 Paraplegia, incomplete: Secondary | ICD-10-CM | POA: Diagnosis not present

## 2019-09-28 DIAGNOSIS — G629 Polyneuropathy, unspecified: Secondary | ICD-10-CM | POA: Diagnosis not present

## 2019-09-28 DIAGNOSIS — Z435 Encounter for attention to cystostomy: Secondary | ICD-10-CM | POA: Diagnosis not present

## 2019-09-28 DIAGNOSIS — F17210 Nicotine dependence, cigarettes, uncomplicated: Secondary | ICD-10-CM | POA: Diagnosis not present

## 2019-09-28 DIAGNOSIS — N319 Neuromuscular dysfunction of bladder, unspecified: Secondary | ICD-10-CM | POA: Diagnosis not present

## 2019-09-28 DIAGNOSIS — F329 Major depressive disorder, single episode, unspecified: Secondary | ICD-10-CM | POA: Diagnosis not present

## 2019-09-28 DIAGNOSIS — G8220 Paraplegia, unspecified: Secondary | ICD-10-CM | POA: Diagnosis not present

## 2019-09-28 DIAGNOSIS — Z79891 Long term (current) use of opiate analgesic: Secondary | ICD-10-CM | POA: Diagnosis not present

## 2019-09-28 DIAGNOSIS — L8995 Pressure ulcer of unspecified site, unstageable: Secondary | ICD-10-CM | POA: Diagnosis not present

## 2019-09-28 DIAGNOSIS — K279 Peptic ulcer, site unspecified, unspecified as acute or chronic, without hemorrhage or perforation: Secondary | ICD-10-CM | POA: Diagnosis not present

## 2019-10-02 DIAGNOSIS — F329 Major depressive disorder, single episode, unspecified: Secondary | ICD-10-CM | POA: Diagnosis not present

## 2019-10-02 DIAGNOSIS — Z79891 Long term (current) use of opiate analgesic: Secondary | ICD-10-CM | POA: Diagnosis not present

## 2019-10-02 DIAGNOSIS — G629 Polyneuropathy, unspecified: Secondary | ICD-10-CM | POA: Diagnosis not present

## 2019-10-02 DIAGNOSIS — Z435 Encounter for attention to cystostomy: Secondary | ICD-10-CM | POA: Diagnosis not present

## 2019-10-02 DIAGNOSIS — L8995 Pressure ulcer of unspecified site, unstageable: Secondary | ICD-10-CM | POA: Diagnosis not present

## 2019-10-02 DIAGNOSIS — K279 Peptic ulcer, site unspecified, unspecified as acute or chronic, without hemorrhage or perforation: Secondary | ICD-10-CM | POA: Diagnosis not present

## 2019-10-02 DIAGNOSIS — F17210 Nicotine dependence, cigarettes, uncomplicated: Secondary | ICD-10-CM | POA: Diagnosis not present

## 2019-10-02 DIAGNOSIS — N319 Neuromuscular dysfunction of bladder, unspecified: Secondary | ICD-10-CM | POA: Diagnosis not present

## 2019-10-02 DIAGNOSIS — G8220 Paraplegia, unspecified: Secondary | ICD-10-CM | POA: Diagnosis not present

## 2019-10-03 DIAGNOSIS — L8995 Pressure ulcer of unspecified site, unstageable: Secondary | ICD-10-CM | POA: Diagnosis not present

## 2019-10-03 DIAGNOSIS — N319 Neuromuscular dysfunction of bladder, unspecified: Secondary | ICD-10-CM | POA: Diagnosis not present

## 2019-10-03 DIAGNOSIS — Z435 Encounter for attention to cystostomy: Secondary | ICD-10-CM | POA: Diagnosis not present

## 2019-10-03 DIAGNOSIS — F329 Major depressive disorder, single episode, unspecified: Secondary | ICD-10-CM | POA: Diagnosis not present

## 2019-10-03 DIAGNOSIS — G8220 Paraplegia, unspecified: Secondary | ICD-10-CM | POA: Diagnosis not present

## 2019-10-06 DIAGNOSIS — Z79899 Other long term (current) drug therapy: Secondary | ICD-10-CM | POA: Diagnosis not present

## 2019-10-06 DIAGNOSIS — N319 Neuromuscular dysfunction of bladder, unspecified: Secondary | ICD-10-CM | POA: Diagnosis not present

## 2019-10-06 DIAGNOSIS — F419 Anxiety disorder, unspecified: Secondary | ICD-10-CM | POA: Diagnosis not present

## 2019-10-06 DIAGNOSIS — Z79891 Long term (current) use of opiate analgesic: Secondary | ICD-10-CM | POA: Diagnosis not present

## 2019-10-06 DIAGNOSIS — R69 Illness, unspecified: Secondary | ICD-10-CM | POA: Diagnosis not present

## 2019-10-06 DIAGNOSIS — R58 Hemorrhage, not elsewhere classified: Secondary | ICD-10-CM | POA: Diagnosis not present

## 2019-10-06 DIAGNOSIS — N3090 Cystitis, unspecified without hematuria: Secondary | ICD-10-CM | POA: Diagnosis not present

## 2019-10-06 DIAGNOSIS — G8220 Paraplegia, unspecified: Secondary | ICD-10-CM | POA: Diagnosis not present

## 2019-10-06 DIAGNOSIS — G629 Polyneuropathy, unspecified: Secondary | ICD-10-CM | POA: Diagnosis not present

## 2019-10-06 DIAGNOSIS — F17200 Nicotine dependence, unspecified, uncomplicated: Secondary | ICD-10-CM | POA: Diagnosis not present

## 2019-10-06 DIAGNOSIS — K8080 Other cholelithiasis without obstruction: Secondary | ICD-10-CM | POA: Diagnosis not present

## 2019-10-06 DIAGNOSIS — F17210 Nicotine dependence, cigarettes, uncomplicated: Secondary | ICD-10-CM | POA: Diagnosis not present

## 2019-10-06 DIAGNOSIS — Z435 Encounter for attention to cystostomy: Secondary | ICD-10-CM | POA: Diagnosis not present

## 2019-10-06 DIAGNOSIS — Z7401 Bed confinement status: Secondary | ICD-10-CM | POA: Diagnosis not present

## 2019-10-06 DIAGNOSIS — R5381 Other malaise: Secondary | ICD-10-CM | POA: Diagnosis not present

## 2019-10-06 DIAGNOSIS — K279 Peptic ulcer, site unspecified, unspecified as acute or chronic, without hemorrhage or perforation: Secondary | ICD-10-CM | POA: Diagnosis not present

## 2019-10-06 DIAGNOSIS — F329 Major depressive disorder, single episode, unspecified: Secondary | ICD-10-CM | POA: Diagnosis not present

## 2019-10-06 DIAGNOSIS — Z87442 Personal history of urinary calculi: Secondary | ICD-10-CM | POA: Diagnosis not present

## 2019-10-06 DIAGNOSIS — Z91040 Latex allergy status: Secondary | ICD-10-CM | POA: Diagnosis not present

## 2019-10-06 DIAGNOSIS — N210 Calculus in bladder: Secondary | ICD-10-CM | POA: Diagnosis not present

## 2019-10-06 DIAGNOSIS — N200 Calculus of kidney: Secondary | ICD-10-CM | POA: Diagnosis not present

## 2019-10-06 DIAGNOSIS — K8020 Calculus of gallbladder without cholecystitis without obstruction: Secondary | ICD-10-CM | POA: Diagnosis not present

## 2019-10-18 DIAGNOSIS — R29898 Other symptoms and signs involving the musculoskeletal system: Secondary | ICD-10-CM | POA: Diagnosis not present

## 2019-10-18 DIAGNOSIS — R531 Weakness: Secondary | ICD-10-CM | POA: Diagnosis not present

## 2019-10-18 DIAGNOSIS — G8220 Paraplegia, unspecified: Secondary | ICD-10-CM | POA: Diagnosis not present

## 2019-10-27 DIAGNOSIS — G8222 Paraplegia, incomplete: Secondary | ICD-10-CM | POA: Diagnosis not present

## 2019-10-31 DIAGNOSIS — R52 Pain, unspecified: Secondary | ICD-10-CM | POA: Diagnosis not present

## 2019-11-03 DIAGNOSIS — Z79891 Long term (current) use of opiate analgesic: Secondary | ICD-10-CM | POA: Diagnosis not present

## 2019-11-03 DIAGNOSIS — Z7401 Bed confinement status: Secondary | ICD-10-CM | POA: Diagnosis not present

## 2019-11-03 DIAGNOSIS — S24139D Anterior cord syndrome at unspecified level of thoracic spinal cord, subsequent encounter: Secondary | ICD-10-CM | POA: Diagnosis not present

## 2019-11-03 DIAGNOSIS — R29898 Other symptoms and signs involving the musculoskeletal system: Secondary | ICD-10-CM | POA: Diagnosis not present

## 2019-11-03 DIAGNOSIS — M5136 Other intervertebral disc degeneration, lumbar region: Secondary | ICD-10-CM | POA: Diagnosis not present

## 2019-11-03 DIAGNOSIS — R000 Tachycardia, unspecified: Secondary | ICD-10-CM | POA: Diagnosis not present

## 2019-11-03 DIAGNOSIS — M5134 Other intervertebral disc degeneration, thoracic region: Secondary | ICD-10-CM | POA: Diagnosis not present

## 2019-11-09 DIAGNOSIS — C329 Malignant neoplasm of larynx, unspecified: Secondary | ICD-10-CM | POA: Diagnosis not present

## 2019-11-09 DIAGNOSIS — F329 Major depressive disorder, single episode, unspecified: Secondary | ICD-10-CM | POA: Diagnosis not present

## 2019-11-09 DIAGNOSIS — G629 Polyneuropathy, unspecified: Secondary | ICD-10-CM | POA: Diagnosis not present

## 2019-11-09 DIAGNOSIS — M545 Low back pain: Secondary | ICD-10-CM | POA: Diagnosis not present

## 2019-11-09 DIAGNOSIS — G8222 Paraplegia, incomplete: Secondary | ICD-10-CM | POA: Diagnosis not present

## 2019-11-09 DIAGNOSIS — R5381 Other malaise: Secondary | ICD-10-CM | POA: Diagnosis not present

## 2019-11-09 DIAGNOSIS — Z7401 Bed confinement status: Secondary | ICD-10-CM | POA: Diagnosis not present

## 2019-11-09 DIAGNOSIS — F320 Major depressive disorder, single episode, mild: Secondary | ICD-10-CM | POA: Diagnosis not present

## 2019-11-09 DIAGNOSIS — N390 Urinary tract infection, site not specified: Secondary | ICD-10-CM | POA: Diagnosis not present

## 2019-11-09 DIAGNOSIS — G950 Syringomyelia and syringobulbia: Secondary | ICD-10-CM | POA: Diagnosis not present

## 2019-11-09 DIAGNOSIS — Z79891 Long term (current) use of opiate analgesic: Secondary | ICD-10-CM | POA: Diagnosis not present

## 2019-11-09 DIAGNOSIS — R69 Illness, unspecified: Secondary | ICD-10-CM | POA: Diagnosis not present

## 2019-11-09 DIAGNOSIS — K2100 Gastro-esophageal reflux disease with esophagitis, without bleeding: Secondary | ICD-10-CM | POA: Diagnosis not present

## 2019-11-09 DIAGNOSIS — F411 Generalized anxiety disorder: Secondary | ICD-10-CM | POA: Diagnosis not present

## 2019-11-09 DIAGNOSIS — K279 Peptic ulcer, site unspecified, unspecified as acute or chronic, without hemorrhage or perforation: Secondary | ICD-10-CM | POA: Diagnosis not present

## 2019-11-09 DIAGNOSIS — Z435 Encounter for attention to cystostomy: Secondary | ICD-10-CM | POA: Diagnosis not present

## 2019-11-09 DIAGNOSIS — F17210 Nicotine dependence, cigarettes, uncomplicated: Secondary | ICD-10-CM | POA: Diagnosis not present

## 2019-11-09 DIAGNOSIS — L89813 Pressure ulcer of head, stage 3: Secondary | ICD-10-CM | POA: Diagnosis not present

## 2019-11-09 DIAGNOSIS — N319 Neuromuscular dysfunction of bladder, unspecified: Secondary | ICD-10-CM | POA: Diagnosis not present

## 2019-11-09 DIAGNOSIS — M4807 Spinal stenosis, lumbosacral region: Secondary | ICD-10-CM | POA: Diagnosis not present

## 2019-11-09 DIAGNOSIS — G8220 Paraplegia, unspecified: Secondary | ICD-10-CM | POA: Diagnosis not present

## 2019-11-09 DIAGNOSIS — R52 Pain, unspecified: Secondary | ICD-10-CM | POA: Diagnosis not present

## 2019-11-09 DIAGNOSIS — F900 Attention-deficit hyperactivity disorder, predominantly inattentive type: Secondary | ICD-10-CM | POA: Diagnosis not present

## 2019-11-23 DIAGNOSIS — G8222 Paraplegia, incomplete: Secondary | ICD-10-CM | POA: Diagnosis not present

## 2019-11-28 DIAGNOSIS — M5136 Other intervertebral disc degeneration, lumbar region: Secondary | ICD-10-CM | POA: Diagnosis not present

## 2019-11-28 DIAGNOSIS — M5134 Other intervertebral disc degeneration, thoracic region: Secondary | ICD-10-CM | POA: Diagnosis not present

## 2019-11-28 DIAGNOSIS — R52 Pain, unspecified: Secondary | ICD-10-CM | POA: Diagnosis not present

## 2019-11-28 DIAGNOSIS — S24139D Anterior cord syndrome at unspecified level of thoracic spinal cord, subsequent encounter: Secondary | ICD-10-CM | POA: Diagnosis not present

## 2019-11-30 DIAGNOSIS — G8220 Paraplegia, unspecified: Secondary | ICD-10-CM | POA: Diagnosis not present

## 2019-11-30 DIAGNOSIS — G629 Polyneuropathy, unspecified: Secondary | ICD-10-CM | POA: Diagnosis not present

## 2019-11-30 DIAGNOSIS — F17210 Nicotine dependence, cigarettes, uncomplicated: Secondary | ICD-10-CM | POA: Diagnosis not present

## 2019-11-30 DIAGNOSIS — Z435 Encounter for attention to cystostomy: Secondary | ICD-10-CM | POA: Diagnosis not present

## 2019-11-30 DIAGNOSIS — F329 Major depressive disorder, single episode, unspecified: Secondary | ICD-10-CM | POA: Diagnosis not present

## 2019-11-30 DIAGNOSIS — Z79891 Long term (current) use of opiate analgesic: Secondary | ICD-10-CM | POA: Diagnosis not present

## 2019-11-30 DIAGNOSIS — N319 Neuromuscular dysfunction of bladder, unspecified: Secondary | ICD-10-CM | POA: Diagnosis not present

## 2019-11-30 DIAGNOSIS — K279 Peptic ulcer, site unspecified, unspecified as acute or chronic, without hemorrhage or perforation: Secondary | ICD-10-CM | POA: Diagnosis not present

## 2019-12-02 DIAGNOSIS — N319 Neuromuscular dysfunction of bladder, unspecified: Secondary | ICD-10-CM | POA: Diagnosis not present

## 2019-12-02 DIAGNOSIS — G629 Polyneuropathy, unspecified: Secondary | ICD-10-CM | POA: Diagnosis not present

## 2019-12-02 DIAGNOSIS — Z435 Encounter for attention to cystostomy: Secondary | ICD-10-CM | POA: Diagnosis not present

## 2019-12-02 DIAGNOSIS — F329 Major depressive disorder, single episode, unspecified: Secondary | ICD-10-CM | POA: Diagnosis not present

## 2019-12-02 DIAGNOSIS — Z8711 Personal history of peptic ulcer disease: Secondary | ICD-10-CM | POA: Diagnosis not present

## 2019-12-02 DIAGNOSIS — F17210 Nicotine dependence, cigarettes, uncomplicated: Secondary | ICD-10-CM | POA: Diagnosis not present

## 2019-12-07 DIAGNOSIS — Z792 Long term (current) use of antibiotics: Secondary | ICD-10-CM | POA: Diagnosis not present

## 2019-12-07 DIAGNOSIS — Z435 Encounter for attention to cystostomy: Secondary | ICD-10-CM | POA: Diagnosis not present

## 2019-12-07 DIAGNOSIS — N390 Urinary tract infection, site not specified: Secondary | ICD-10-CM | POA: Diagnosis not present

## 2019-12-07 DIAGNOSIS — F329 Major depressive disorder, single episode, unspecified: Secondary | ICD-10-CM | POA: Diagnosis not present

## 2019-12-07 DIAGNOSIS — N319 Neuromuscular dysfunction of bladder, unspecified: Secondary | ICD-10-CM | POA: Diagnosis not present

## 2019-12-07 DIAGNOSIS — F17210 Nicotine dependence, cigarettes, uncomplicated: Secondary | ICD-10-CM | POA: Diagnosis not present

## 2019-12-07 DIAGNOSIS — Z8711 Personal history of peptic ulcer disease: Secondary | ICD-10-CM | POA: Diagnosis not present

## 2019-12-07 DIAGNOSIS — G629 Polyneuropathy, unspecified: Secondary | ICD-10-CM | POA: Diagnosis not present

## 2019-12-07 DIAGNOSIS — Z79891 Long term (current) use of opiate analgesic: Secondary | ICD-10-CM | POA: Diagnosis not present

## 2019-12-07 DIAGNOSIS — G8220 Paraplegia, unspecified: Secondary | ICD-10-CM | POA: Diagnosis not present

## 2019-12-22 DIAGNOSIS — G8222 Paraplegia, incomplete: Secondary | ICD-10-CM | POA: Diagnosis not present

## 2020-01-01 DIAGNOSIS — M5136 Other intervertebral disc degeneration, lumbar region: Secondary | ICD-10-CM | POA: Diagnosis not present

## 2020-01-01 DIAGNOSIS — M5134 Other intervertebral disc degeneration, thoracic region: Secondary | ICD-10-CM | POA: Diagnosis not present

## 2020-01-01 DIAGNOSIS — S24139D Anterior cord syndrome at unspecified level of thoracic spinal cord, subsequent encounter: Secondary | ICD-10-CM | POA: Diagnosis not present

## 2020-01-01 DIAGNOSIS — R531 Weakness: Secondary | ICD-10-CM | POA: Diagnosis not present

## 2020-01-04 DIAGNOSIS — Z7401 Bed confinement status: Secondary | ICD-10-CM | POA: Diagnosis not present

## 2020-01-04 DIAGNOSIS — G8220 Paraplegia, unspecified: Secondary | ICD-10-CM | POA: Diagnosis not present

## 2020-01-04 DIAGNOSIS — R5381 Other malaise: Secondary | ICD-10-CM | POA: Diagnosis not present

## 2020-01-04 DIAGNOSIS — R252 Cramp and spasm: Secondary | ICD-10-CM | POA: Diagnosis not present

## 2020-01-04 DIAGNOSIS — R69 Illness, unspecified: Secondary | ICD-10-CM | POA: Diagnosis not present

## 2020-01-11 DIAGNOSIS — Z8711 Personal history of peptic ulcer disease: Secondary | ICD-10-CM | POA: Diagnosis not present

## 2020-01-11 DIAGNOSIS — G629 Polyneuropathy, unspecified: Secondary | ICD-10-CM | POA: Diagnosis not present

## 2020-01-11 DIAGNOSIS — Z79891 Long term (current) use of opiate analgesic: Secondary | ICD-10-CM | POA: Diagnosis not present

## 2020-01-11 DIAGNOSIS — F17210 Nicotine dependence, cigarettes, uncomplicated: Secondary | ICD-10-CM | POA: Diagnosis not present

## 2020-01-11 DIAGNOSIS — Z435 Encounter for attention to cystostomy: Secondary | ICD-10-CM | POA: Diagnosis not present

## 2020-01-11 DIAGNOSIS — F329 Major depressive disorder, single episode, unspecified: Secondary | ICD-10-CM | POA: Diagnosis not present

## 2020-01-11 DIAGNOSIS — G8220 Paraplegia, unspecified: Secondary | ICD-10-CM | POA: Diagnosis not present

## 2020-01-11 DIAGNOSIS — N319 Neuromuscular dysfunction of bladder, unspecified: Secondary | ICD-10-CM | POA: Diagnosis not present

## 2020-01-11 DIAGNOSIS — Z792 Long term (current) use of antibiotics: Secondary | ICD-10-CM | POA: Diagnosis not present

## 2020-01-15 ENCOUNTER — Ambulatory Visit: Payer: Medicare HMO | Admitting: Urology

## 2020-01-23 DIAGNOSIS — G8222 Paraplegia, incomplete: Secondary | ICD-10-CM | POA: Diagnosis not present

## 2020-01-24 DIAGNOSIS — G8220 Paraplegia, unspecified: Secondary | ICD-10-CM | POA: Diagnosis not present

## 2020-01-24 DIAGNOSIS — Z8711 Personal history of peptic ulcer disease: Secondary | ICD-10-CM | POA: Diagnosis not present

## 2020-01-24 DIAGNOSIS — G629 Polyneuropathy, unspecified: Secondary | ICD-10-CM | POA: Diagnosis not present

## 2020-01-24 DIAGNOSIS — N319 Neuromuscular dysfunction of bladder, unspecified: Secondary | ICD-10-CM | POA: Diagnosis not present

## 2020-01-24 DIAGNOSIS — F17210 Nicotine dependence, cigarettes, uncomplicated: Secondary | ICD-10-CM | POA: Diagnosis not present

## 2020-01-24 DIAGNOSIS — Z435 Encounter for attention to cystostomy: Secondary | ICD-10-CM | POA: Diagnosis not present

## 2020-01-24 DIAGNOSIS — Z792 Long term (current) use of antibiotics: Secondary | ICD-10-CM | POA: Diagnosis not present

## 2020-01-24 DIAGNOSIS — Z79891 Long term (current) use of opiate analgesic: Secondary | ICD-10-CM | POA: Diagnosis not present

## 2020-01-24 DIAGNOSIS — F329 Major depressive disorder, single episode, unspecified: Secondary | ICD-10-CM | POA: Diagnosis not present

## 2020-01-29 ENCOUNTER — Telehealth: Payer: Self-pay | Admitting: Urology

## 2020-01-29 DIAGNOSIS — M5134 Other intervertebral disc degeneration, thoracic region: Secondary | ICD-10-CM | POA: Diagnosis not present

## 2020-01-29 DIAGNOSIS — Z7401 Bed confinement status: Secondary | ICD-10-CM | POA: Diagnosis not present

## 2020-01-29 DIAGNOSIS — M5136 Other intervertebral disc degeneration, lumbar region: Secondary | ICD-10-CM | POA: Diagnosis not present

## 2020-01-29 DIAGNOSIS — R52 Pain, unspecified: Secondary | ICD-10-CM | POA: Diagnosis not present

## 2020-01-29 DIAGNOSIS — S24139D Anterior cord syndrome at unspecified level of thoracic spinal cord, subsequent encounter: Secondary | ICD-10-CM | POA: Diagnosis not present

## 2020-01-29 DIAGNOSIS — R531 Weakness: Secondary | ICD-10-CM | POA: Diagnosis not present

## 2020-01-29 NOTE — Telephone Encounter (Signed)
Left VM to reschedule an appt. States he is having pain from a kidney stone, when would you advise him to come in?

## 2020-01-29 NOTE — Telephone Encounter (Signed)
Dr. Alyson Ingles said any stone pt can be placed at 3:45 appt slot. ONLY STONE PT for those slots.

## 2020-01-30 DIAGNOSIS — G8220 Paraplegia, unspecified: Secondary | ICD-10-CM | POA: Diagnosis not present

## 2020-01-30 DIAGNOSIS — N319 Neuromuscular dysfunction of bladder, unspecified: Secondary | ICD-10-CM | POA: Diagnosis not present

## 2020-01-30 DIAGNOSIS — F17210 Nicotine dependence, cigarettes, uncomplicated: Secondary | ICD-10-CM | POA: Diagnosis not present

## 2020-01-30 DIAGNOSIS — Z435 Encounter for attention to cystostomy: Secondary | ICD-10-CM | POA: Diagnosis not present

## 2020-01-30 DIAGNOSIS — Z8711 Personal history of peptic ulcer disease: Secondary | ICD-10-CM | POA: Diagnosis not present

## 2020-01-30 DIAGNOSIS — G629 Polyneuropathy, unspecified: Secondary | ICD-10-CM | POA: Diagnosis not present

## 2020-01-30 DIAGNOSIS — Z79891 Long term (current) use of opiate analgesic: Secondary | ICD-10-CM | POA: Diagnosis not present

## 2020-01-30 DIAGNOSIS — Z792 Long term (current) use of antibiotics: Secondary | ICD-10-CM | POA: Diagnosis not present

## 2020-01-30 DIAGNOSIS — F329 Major depressive disorder, single episode, unspecified: Secondary | ICD-10-CM | POA: Diagnosis not present

## 2020-01-31 DIAGNOSIS — Z435 Encounter for attention to cystostomy: Secondary | ICD-10-CM | POA: Diagnosis not present

## 2020-01-31 DIAGNOSIS — F329 Major depressive disorder, single episode, unspecified: Secondary | ICD-10-CM | POA: Diagnosis not present

## 2020-01-31 DIAGNOSIS — Z8711 Personal history of peptic ulcer disease: Secondary | ICD-10-CM | POA: Diagnosis not present

## 2020-01-31 DIAGNOSIS — N319 Neuromuscular dysfunction of bladder, unspecified: Secondary | ICD-10-CM | POA: Diagnosis not present

## 2020-01-31 DIAGNOSIS — G629 Polyneuropathy, unspecified: Secondary | ICD-10-CM | POA: Diagnosis not present

## 2020-01-31 DIAGNOSIS — F17210 Nicotine dependence, cigarettes, uncomplicated: Secondary | ICD-10-CM | POA: Diagnosis not present

## 2020-02-19 DIAGNOSIS — G8222 Paraplegia, incomplete: Secondary | ICD-10-CM | POA: Diagnosis not present

## 2020-02-23 DIAGNOSIS — M5134 Other intervertebral disc degeneration, thoracic region: Secondary | ICD-10-CM | POA: Diagnosis not present

## 2020-02-23 DIAGNOSIS — R69 Illness, unspecified: Secondary | ICD-10-CM | POA: Diagnosis not present

## 2020-02-23 DIAGNOSIS — M5136 Other intervertebral disc degeneration, lumbar region: Secondary | ICD-10-CM | POA: Diagnosis not present

## 2020-02-23 DIAGNOSIS — K219 Gastro-esophageal reflux disease without esophagitis: Secondary | ICD-10-CM | POA: Diagnosis not present

## 2020-02-23 DIAGNOSIS — I959 Hypotension, unspecified: Secondary | ICD-10-CM | POA: Diagnosis not present

## 2020-02-23 DIAGNOSIS — R5381 Other malaise: Secondary | ICD-10-CM | POA: Diagnosis not present

## 2020-02-23 DIAGNOSIS — Z79899 Other long term (current) drug therapy: Secondary | ICD-10-CM | POA: Diagnosis not present

## 2020-02-28 DIAGNOSIS — F320 Major depressive disorder, single episode, mild: Secondary | ICD-10-CM | POA: Diagnosis not present

## 2020-02-28 DIAGNOSIS — M545 Low back pain: Secondary | ICD-10-CM | POA: Diagnosis not present

## 2020-02-28 DIAGNOSIS — K2100 Gastro-esophageal reflux disease with esophagitis, without bleeding: Secondary | ICD-10-CM | POA: Diagnosis not present

## 2020-02-28 DIAGNOSIS — F900 Attention-deficit hyperactivity disorder, predominantly inattentive type: Secondary | ICD-10-CM | POA: Diagnosis not present

## 2020-02-28 DIAGNOSIS — L89813 Pressure ulcer of head, stage 3: Secondary | ICD-10-CM | POA: Diagnosis not present

## 2020-02-28 DIAGNOSIS — Z049 Encounter for examination and observation for unspecified reason: Secondary | ICD-10-CM | POA: Diagnosis not present

## 2020-02-28 DIAGNOSIS — F411 Generalized anxiety disorder: Secondary | ICD-10-CM | POA: Diagnosis not present

## 2020-02-28 DIAGNOSIS — G8222 Paraplegia, incomplete: Secondary | ICD-10-CM | POA: Diagnosis not present

## 2020-03-14 DIAGNOSIS — G8220 Paraplegia, unspecified: Secondary | ICD-10-CM | POA: Diagnosis not present

## 2020-03-14 DIAGNOSIS — R252 Cramp and spasm: Secondary | ICD-10-CM | POA: Diagnosis not present

## 2020-03-21 DIAGNOSIS — G8222 Paraplegia, incomplete: Secondary | ICD-10-CM | POA: Diagnosis not present

## 2020-03-22 DIAGNOSIS — R29898 Other symptoms and signs involving the musculoskeletal system: Secondary | ICD-10-CM | POA: Diagnosis not present

## 2020-03-22 DIAGNOSIS — Z743 Need for continuous supervision: Secondary | ICD-10-CM | POA: Diagnosis not present

## 2020-03-22 DIAGNOSIS — M5134 Other intervertebral disc degeneration, thoracic region: Secondary | ICD-10-CM | POA: Diagnosis not present

## 2020-03-22 DIAGNOSIS — Z7401 Bed confinement status: Secondary | ICD-10-CM | POA: Diagnosis not present

## 2020-03-22 DIAGNOSIS — Z79899 Other long term (current) drug therapy: Secondary | ICD-10-CM | POA: Diagnosis not present

## 2020-03-22 DIAGNOSIS — S24139D Anterior cord syndrome at unspecified level of thoracic spinal cord, subsequent encounter: Secondary | ICD-10-CM | POA: Diagnosis not present

## 2020-03-22 DIAGNOSIS — M5136 Other intervertebral disc degeneration, lumbar region: Secondary | ICD-10-CM | POA: Diagnosis not present

## 2020-04-19 DIAGNOSIS — Z79899 Other long term (current) drug therapy: Secondary | ICD-10-CM | POA: Diagnosis not present

## 2020-04-19 DIAGNOSIS — M5136 Other intervertebral disc degeneration, lumbar region: Secondary | ICD-10-CM | POA: Diagnosis not present

## 2020-04-19 DIAGNOSIS — Z7401 Bed confinement status: Secondary | ICD-10-CM | POA: Diagnosis not present

## 2020-04-19 DIAGNOSIS — R52 Pain, unspecified: Secondary | ICD-10-CM | POA: Diagnosis not present

## 2020-04-19 DIAGNOSIS — M5134 Other intervertebral disc degeneration, thoracic region: Secondary | ICD-10-CM | POA: Diagnosis not present

## 2020-04-19 DIAGNOSIS — S24139D Anterior cord syndrome at unspecified level of thoracic spinal cord, subsequent encounter: Secondary | ICD-10-CM | POA: Diagnosis not present

## 2020-05-14 DIAGNOSIS — G8222 Paraplegia, incomplete: Secondary | ICD-10-CM | POA: Diagnosis not present

## 2020-05-14 DIAGNOSIS — K2100 Gastro-esophageal reflux disease with esophagitis, without bleeding: Secondary | ICD-10-CM | POA: Diagnosis not present

## 2020-05-14 DIAGNOSIS — L89813 Pressure ulcer of head, stage 3: Secondary | ICD-10-CM | POA: Diagnosis not present

## 2020-05-14 DIAGNOSIS — F411 Generalized anxiety disorder: Secondary | ICD-10-CM | POA: Diagnosis not present

## 2020-05-14 DIAGNOSIS — E785 Hyperlipidemia, unspecified: Secondary | ICD-10-CM | POA: Diagnosis not present

## 2020-05-14 DIAGNOSIS — F320 Major depressive disorder, single episode, mild: Secondary | ICD-10-CM | POA: Diagnosis not present

## 2020-05-14 DIAGNOSIS — M545 Low back pain: Secondary | ICD-10-CM | POA: Diagnosis not present

## 2020-05-14 DIAGNOSIS — F900 Attention-deficit hyperactivity disorder, predominantly inattentive type: Secondary | ICD-10-CM | POA: Diagnosis not present

## 2020-05-17 DIAGNOSIS — S24139D Anterior cord syndrome at unspecified level of thoracic spinal cord, subsequent encounter: Secondary | ICD-10-CM | POA: Diagnosis not present

## 2020-05-17 DIAGNOSIS — M5134 Other intervertebral disc degeneration, thoracic region: Secondary | ICD-10-CM | POA: Diagnosis not present

## 2020-05-17 DIAGNOSIS — Z79899 Other long term (current) drug therapy: Secondary | ICD-10-CM | POA: Diagnosis not present

## 2020-05-17 DIAGNOSIS — M5136 Other intervertebral disc degeneration, lumbar region: Secondary | ICD-10-CM | POA: Diagnosis not present

## 2020-06-14 DIAGNOSIS — Z79899 Other long term (current) drug therapy: Secondary | ICD-10-CM | POA: Diagnosis not present

## 2020-06-14 DIAGNOSIS — M5136 Other intervertebral disc degeneration, lumbar region: Secondary | ICD-10-CM | POA: Diagnosis not present

## 2020-06-14 DIAGNOSIS — Z7401 Bed confinement status: Secondary | ICD-10-CM | POA: Diagnosis not present

## 2020-06-14 DIAGNOSIS — S24139D Anterior cord syndrome at unspecified level of thoracic spinal cord, subsequent encounter: Secondary | ICD-10-CM | POA: Diagnosis not present

## 2020-06-14 DIAGNOSIS — I959 Hypotension, unspecified: Secondary | ICD-10-CM | POA: Diagnosis not present

## 2020-06-14 DIAGNOSIS — R52 Pain, unspecified: Secondary | ICD-10-CM | POA: Diagnosis not present

## 2020-06-14 DIAGNOSIS — N200 Calculus of kidney: Secondary | ICD-10-CM | POA: Diagnosis not present

## 2020-06-14 DIAGNOSIS — R0902 Hypoxemia: Secondary | ICD-10-CM | POA: Diagnosis not present

## 2020-06-14 DIAGNOSIS — M5134 Other intervertebral disc degeneration, thoracic region: Secondary | ICD-10-CM | POA: Diagnosis not present

## 2020-06-27 DIAGNOSIS — G8222 Paraplegia, incomplete: Secondary | ICD-10-CM | POA: Diagnosis not present

## 2020-06-27 DIAGNOSIS — L89159 Pressure ulcer of sacral region, unspecified stage: Secondary | ICD-10-CM | POA: Diagnosis not present

## 2020-06-27 DIAGNOSIS — R32 Unspecified urinary incontinence: Secondary | ICD-10-CM | POA: Diagnosis not present

## 2020-06-27 DIAGNOSIS — K9430 Esophagostomy complications, unspecified: Secondary | ICD-10-CM | POA: Diagnosis not present

## 2020-07-01 DIAGNOSIS — R32 Unspecified urinary incontinence: Secondary | ICD-10-CM | POA: Diagnosis not present

## 2020-07-01 DIAGNOSIS — L89159 Pressure ulcer of sacral region, unspecified stage: Secondary | ICD-10-CM | POA: Diagnosis not present

## 2020-07-01 DIAGNOSIS — K9430 Esophagostomy complications, unspecified: Secondary | ICD-10-CM | POA: Diagnosis not present

## 2020-07-02 DIAGNOSIS — R32 Unspecified urinary incontinence: Secondary | ICD-10-CM | POA: Diagnosis not present

## 2020-07-02 DIAGNOSIS — L89159 Pressure ulcer of sacral region, unspecified stage: Secondary | ICD-10-CM | POA: Diagnosis not present

## 2020-07-02 DIAGNOSIS — K9430 Esophagostomy complications, unspecified: Secondary | ICD-10-CM | POA: Diagnosis not present

## 2020-07-12 DIAGNOSIS — L89159 Pressure ulcer of sacral region, unspecified stage: Secondary | ICD-10-CM | POA: Diagnosis not present

## 2020-07-12 DIAGNOSIS — R279 Unspecified lack of coordination: Secondary | ICD-10-CM | POA: Diagnosis not present

## 2020-07-12 DIAGNOSIS — R32 Unspecified urinary incontinence: Secondary | ICD-10-CM | POA: Diagnosis not present

## 2020-07-12 DIAGNOSIS — S24139D Anterior cord syndrome at unspecified level of thoracic spinal cord, subsequent encounter: Secondary | ICD-10-CM | POA: Diagnosis not present

## 2020-07-12 DIAGNOSIS — K9430 Esophagostomy complications, unspecified: Secondary | ICD-10-CM | POA: Diagnosis not present

## 2020-07-12 DIAGNOSIS — M5134 Other intervertebral disc degeneration, thoracic region: Secondary | ICD-10-CM | POA: Diagnosis not present

## 2020-07-12 DIAGNOSIS — Z7401 Bed confinement status: Secondary | ICD-10-CM | POA: Diagnosis not present

## 2020-07-12 DIAGNOSIS — R0902 Hypoxemia: Secondary | ICD-10-CM | POA: Diagnosis not present

## 2020-07-12 DIAGNOSIS — M5136 Other intervertebral disc degeneration, lumbar region: Secondary | ICD-10-CM | POA: Diagnosis not present

## 2020-07-12 DIAGNOSIS — Z79899 Other long term (current) drug therapy: Secondary | ICD-10-CM | POA: Diagnosis not present

## 2020-07-26 DIAGNOSIS — G8222 Paraplegia, incomplete: Secondary | ICD-10-CM | POA: Diagnosis not present

## 2020-08-08 DIAGNOSIS — L89159 Pressure ulcer of sacral region, unspecified stage: Secondary | ICD-10-CM | POA: Diagnosis not present

## 2020-08-08 DIAGNOSIS — K9430 Esophagostomy complications, unspecified: Secondary | ICD-10-CM | POA: Diagnosis not present

## 2020-08-08 DIAGNOSIS — R32 Unspecified urinary incontinence: Secondary | ICD-10-CM | POA: Diagnosis not present

## 2020-08-09 DIAGNOSIS — R0902 Hypoxemia: Secondary | ICD-10-CM | POA: Diagnosis not present

## 2020-08-09 DIAGNOSIS — S24139D Anterior cord syndrome at unspecified level of thoracic spinal cord, subsequent encounter: Secondary | ICD-10-CM | POA: Diagnosis not present

## 2020-08-09 DIAGNOSIS — M5136 Other intervertebral disc degeneration, lumbar region: Secondary | ICD-10-CM | POA: Diagnosis not present

## 2020-08-09 DIAGNOSIS — M5134 Other intervertebral disc degeneration, thoracic region: Secondary | ICD-10-CM | POA: Diagnosis not present

## 2020-08-09 DIAGNOSIS — Z79899 Other long term (current) drug therapy: Secondary | ICD-10-CM | POA: Diagnosis not present

## 2020-08-09 DIAGNOSIS — R52 Pain, unspecified: Secondary | ICD-10-CM | POA: Diagnosis not present

## 2020-08-09 DIAGNOSIS — R404 Transient alteration of awareness: Secondary | ICD-10-CM | POA: Diagnosis not present

## 2020-08-09 DIAGNOSIS — Z7401 Bed confinement status: Secondary | ICD-10-CM | POA: Diagnosis not present

## 2020-09-05 DIAGNOSIS — Z7401 Bed confinement status: Secondary | ICD-10-CM | POA: Diagnosis not present

## 2020-09-05 DIAGNOSIS — Z79899 Other long term (current) drug therapy: Secondary | ICD-10-CM | POA: Diagnosis not present

## 2020-09-05 DIAGNOSIS — Z79891 Long term (current) use of opiate analgesic: Secondary | ICD-10-CM | POA: Diagnosis not present

## 2020-09-05 DIAGNOSIS — S24139D Anterior cord syndrome at unspecified level of thoracic spinal cord, subsequent encounter: Secondary | ICD-10-CM | POA: Diagnosis not present

## 2020-09-05 DIAGNOSIS — R5381 Other malaise: Secondary | ICD-10-CM | POA: Diagnosis not present

## 2020-09-05 DIAGNOSIS — F1120 Opioid dependence, uncomplicated: Secondary | ICD-10-CM | POA: Diagnosis not present

## 2020-09-05 DIAGNOSIS — R279 Unspecified lack of coordination: Secondary | ICD-10-CM | POA: Diagnosis not present

## 2020-09-26 DIAGNOSIS — G8222 Paraplegia, incomplete: Secondary | ICD-10-CM | POA: Diagnosis not present

## 2020-09-27 DIAGNOSIS — K2100 Gastro-esophageal reflux disease with esophagitis, without bleeding: Secondary | ICD-10-CM | POA: Diagnosis not present

## 2020-09-27 DIAGNOSIS — F320 Major depressive disorder, single episode, mild: Secondary | ICD-10-CM | POA: Diagnosis not present

## 2020-09-27 DIAGNOSIS — F411 Generalized anxiety disorder: Secondary | ICD-10-CM | POA: Diagnosis not present

## 2020-09-27 DIAGNOSIS — L89813 Pressure ulcer of head, stage 3: Secondary | ICD-10-CM | POA: Diagnosis not present

## 2020-09-27 DIAGNOSIS — F900 Attention-deficit hyperactivity disorder, predominantly inattentive type: Secondary | ICD-10-CM | POA: Diagnosis not present

## 2020-09-27 DIAGNOSIS — G8222 Paraplegia, incomplete: Secondary | ICD-10-CM | POA: Diagnosis not present

## 2020-09-27 DIAGNOSIS — M545 Low back pain, unspecified: Secondary | ICD-10-CM | POA: Diagnosis not present

## 2020-10-03 DIAGNOSIS — S24139D Anterior cord syndrome at unspecified level of thoracic spinal cord, subsequent encounter: Secondary | ICD-10-CM | POA: Diagnosis not present

## 2020-10-03 DIAGNOSIS — F1120 Opioid dependence, uncomplicated: Secondary | ICD-10-CM | POA: Diagnosis not present

## 2020-10-03 DIAGNOSIS — R5381 Other malaise: Secondary | ICD-10-CM | POA: Diagnosis not present

## 2020-10-03 DIAGNOSIS — R531 Weakness: Secondary | ICD-10-CM | POA: Diagnosis not present

## 2020-10-03 DIAGNOSIS — Z79891 Long term (current) use of opiate analgesic: Secondary | ICD-10-CM | POA: Diagnosis not present

## 2020-10-03 DIAGNOSIS — Z79899 Other long term (current) drug therapy: Secondary | ICD-10-CM | POA: Diagnosis not present

## 2020-10-03 DIAGNOSIS — Z743 Need for continuous supervision: Secondary | ICD-10-CM | POA: Diagnosis not present

## 2020-10-25 DIAGNOSIS — G8222 Paraplegia, incomplete: Secondary | ICD-10-CM | POA: Diagnosis not present

## 2020-11-05 ENCOUNTER — Ambulatory Visit: Payer: Medicare HMO | Admitting: Urology

## 2020-11-14 DIAGNOSIS — Z20822 Contact with and (suspected) exposure to covid-19: Secondary | ICD-10-CM | POA: Diagnosis not present

## 2020-11-14 DIAGNOSIS — Z7401 Bed confinement status: Secondary | ICD-10-CM | POA: Diagnosis not present

## 2020-11-14 DIAGNOSIS — F1120 Opioid dependence, uncomplicated: Secondary | ICD-10-CM | POA: Diagnosis not present

## 2020-11-14 DIAGNOSIS — R531 Weakness: Secondary | ICD-10-CM | POA: Diagnosis not present

## 2020-11-14 DIAGNOSIS — S24139D Anterior cord syndrome at unspecified level of thoracic spinal cord, subsequent encounter: Secondary | ICD-10-CM | POA: Diagnosis not present

## 2020-11-14 DIAGNOSIS — J069 Acute upper respiratory infection, unspecified: Secondary | ICD-10-CM | POA: Diagnosis not present

## 2020-11-14 DIAGNOSIS — Z79899 Other long term (current) drug therapy: Secondary | ICD-10-CM | POA: Diagnosis not present

## 2020-11-18 ENCOUNTER — Encounter: Payer: Self-pay | Admitting: Urology

## 2020-11-18 ENCOUNTER — Other Ambulatory Visit: Payer: Self-pay

## 2020-11-18 ENCOUNTER — Ambulatory Visit (INDEPENDENT_AMBULATORY_CARE_PROVIDER_SITE_OTHER): Payer: Medicare HMO | Admitting: Urology

## 2020-11-18 VITALS — BP 108/72 | HR 76 | Temp 98.7°F | Ht 72.0 in | Wt 190.0 lb

## 2020-11-18 DIAGNOSIS — N210 Calculus in bladder: Secondary | ICD-10-CM | POA: Diagnosis not present

## 2020-11-18 DIAGNOSIS — R1032 Left lower quadrant pain: Secondary | ICD-10-CM

## 2020-11-18 DIAGNOSIS — N319 Neuromuscular dysfunction of bladder, unspecified: Secondary | ICD-10-CM

## 2020-11-18 DIAGNOSIS — Z7401 Bed confinement status: Secondary | ICD-10-CM | POA: Diagnosis not present

## 2020-11-18 DIAGNOSIS — N200 Calculus of kidney: Secondary | ICD-10-CM

## 2020-11-18 DIAGNOSIS — R69 Illness, unspecified: Secondary | ICD-10-CM | POA: Diagnosis not present

## 2020-11-18 NOTE — Patient Instructions (Signed)
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
## 2020-11-18 NOTE — Progress Notes (Signed)
Urological Symptom Review  Patient is experiencing the following symptoms: Get up at night to urinate   Review of Systems  Gastrointestinal (upper)  : Negative for upper GI symptoms  Gastrointestinal (lower) : Negative for lower GI symptoms  Constitutional : Negative for symptoms  Skin: Negative for skin symptoms  Eyes: Negative for eye symptoms  Ear/Nose/Throat : None  Hematologic/Lymphatic: Negative for Hematologic/Lymphatic symptoms  Cardiovascular : Negative for cardiovascular symptoms  Respiratory : Negative for respiratory symptoms  Endocrine: Negative for endocrine symptoms  Musculoskeletal: Back pain  Neurological: Negative for neurological symptoms  Psychologic: Depression Anxiety

## 2020-11-20 ENCOUNTER — Ambulatory Visit: Payer: Medicare HMO | Admitting: Urology

## 2020-11-25 DIAGNOSIS — G8222 Paraplegia, incomplete: Secondary | ICD-10-CM | POA: Diagnosis not present

## 2020-12-12 DIAGNOSIS — S24139D Anterior cord syndrome at unspecified level of thoracic spinal cord, subsequent encounter: Secondary | ICD-10-CM | POA: Diagnosis not present

## 2020-12-12 DIAGNOSIS — G8929 Other chronic pain: Secondary | ICD-10-CM | POA: Diagnosis not present

## 2020-12-12 DIAGNOSIS — M25561 Pain in right knee: Secondary | ICD-10-CM | POA: Diagnosis not present

## 2020-12-12 DIAGNOSIS — Z79899 Other long term (current) drug therapy: Secondary | ICD-10-CM | POA: Diagnosis not present

## 2020-12-13 NOTE — Addendum Note (Signed)
Addended by: Dorisann Frames on: 12/13/2020 02:10 PM   Modules accepted: Orders

## 2020-12-16 ENCOUNTER — Ambulatory Visit: Payer: Medicare HMO | Admitting: Urology

## 2021-01-09 ENCOUNTER — Ambulatory Visit (HOSPITAL_COMMUNITY): Payer: Medicare HMO

## 2021-01-26 DEATH — deceased

## 2021-01-27 ENCOUNTER — Ambulatory Visit: Payer: Medicare HMO | Admitting: Urology

## 2021-01-27 DIAGNOSIS — N200 Calculus of kidney: Secondary | ICD-10-CM
